# Patient Record
Sex: Female | Born: 1960
Health system: Southern US, Community
[De-identification: ages and names within clinical notes are randomized; demographics above are authoritative.]

## PROBLEM LIST (undated history)

## (undated) DIAGNOSIS — Z21 Asymptomatic human immunodeficiency virus [HIV] infection status: Secondary | ICD-10-CM

## (undated) DIAGNOSIS — B20 Human immunodeficiency virus [HIV] disease: Secondary | ICD-10-CM

## (undated) DIAGNOSIS — K759 Inflammatory liver disease, unspecified: Secondary | ICD-10-CM

## (undated) DIAGNOSIS — F419 Anxiety disorder, unspecified: Secondary | ICD-10-CM

## (undated) DIAGNOSIS — I10 Essential (primary) hypertension: Secondary | ICD-10-CM

## (undated) DIAGNOSIS — F1110 Opioid abuse, uncomplicated: Secondary | ICD-10-CM

## (undated) DIAGNOSIS — F329 Major depressive disorder, single episode, unspecified: Secondary | ICD-10-CM

## (undated) DIAGNOSIS — F32A Depression, unspecified: Secondary | ICD-10-CM

## (undated) HISTORY — DX: Essential (primary) hypertension: I10

## (undated) HISTORY — DX: Asymptomatic human immunodeficiency virus (hiv) infection status: Z21

## (undated) HISTORY — DX: Human immunodeficiency virus (HIV) disease: B20

---

## 1998-04-13 ENCOUNTER — Emergency Department (HOSPITAL_COMMUNITY): Admission: EM | Admit: 1998-04-13 | Discharge: 1998-04-13 | Payer: Self-pay | Admitting: Emergency Medicine

## 2001-06-11 ENCOUNTER — Emergency Department (HOSPITAL_COMMUNITY): Admission: EM | Admit: 2001-06-11 | Discharge: 2001-06-11 | Payer: Self-pay | Admitting: Emergency Medicine

## 2002-01-19 ENCOUNTER — Observation Stay (HOSPITAL_COMMUNITY): Admission: AD | Admit: 2002-01-19 | Discharge: 2002-01-20 | Payer: Self-pay | Admitting: Obstetrics and Gynecology

## 2002-01-19 ENCOUNTER — Encounter: Payer: Self-pay | Admitting: Obstetrics and Gynecology

## 2003-09-23 ENCOUNTER — Emergency Department (HOSPITAL_COMMUNITY): Admission: EM | Admit: 2003-09-23 | Discharge: 2003-09-23 | Payer: Self-pay | Admitting: *Deleted

## 2004-01-26 ENCOUNTER — Emergency Department (HOSPITAL_COMMUNITY): Admission: EM | Admit: 2004-01-26 | Discharge: 2004-01-26 | Payer: Self-pay | Admitting: Emergency Medicine

## 2004-04-23 ENCOUNTER — Emergency Department (HOSPITAL_COMMUNITY): Admission: EM | Admit: 2004-04-23 | Discharge: 2004-04-23 | Payer: Self-pay | Admitting: Emergency Medicine

## 2004-06-03 ENCOUNTER — Emergency Department (HOSPITAL_COMMUNITY): Admission: EM | Admit: 2004-06-03 | Discharge: 2004-06-03 | Payer: Self-pay | Admitting: Family Medicine

## 2005-01-30 ENCOUNTER — Ambulatory Visit: Payer: Self-pay | Admitting: Infectious Diseases

## 2005-01-30 ENCOUNTER — Ambulatory Visit (HOSPITAL_COMMUNITY): Admission: RE | Admit: 2005-01-30 | Discharge: 2005-01-30 | Payer: Self-pay | Admitting: Infectious Diseases

## 2005-05-08 ENCOUNTER — Ambulatory Visit: Payer: Self-pay | Admitting: Infectious Diseases

## 2005-11-26 ENCOUNTER — Emergency Department (HOSPITAL_COMMUNITY): Admission: EM | Admit: 2005-11-26 | Discharge: 2005-11-27 | Payer: Self-pay | Admitting: Emergency Medicine

## 2006-02-11 ENCOUNTER — Emergency Department (HOSPITAL_COMMUNITY): Admission: EM | Admit: 2006-02-11 | Discharge: 2006-02-11 | Payer: Self-pay | Admitting: Family Medicine

## 2006-04-23 ENCOUNTER — Emergency Department (HOSPITAL_COMMUNITY): Admission: EM | Admit: 2006-04-23 | Discharge: 2006-04-23 | Payer: Self-pay | Admitting: Emergency Medicine

## 2006-04-24 ENCOUNTER — Emergency Department (HOSPITAL_COMMUNITY): Admission: EM | Admit: 2006-04-24 | Discharge: 2006-04-24 | Payer: Self-pay | Admitting: Emergency Medicine

## 2006-04-27 ENCOUNTER — Emergency Department (HOSPITAL_COMMUNITY): Admission: EM | Admit: 2006-04-27 | Discharge: 2006-04-27 | Payer: Self-pay | Admitting: Internal Medicine

## 2006-04-30 ENCOUNTER — Ambulatory Visit: Payer: Self-pay | Admitting: Internal Medicine

## 2006-04-30 ENCOUNTER — Encounter: Admission: RE | Admit: 2006-04-30 | Discharge: 2006-04-30 | Payer: Self-pay | Admitting: Internal Medicine

## 2006-04-30 ENCOUNTER — Encounter (INDEPENDENT_AMBULATORY_CARE_PROVIDER_SITE_OTHER): Payer: Self-pay | Admitting: *Deleted

## 2006-04-30 LAB — CONVERTED CEMR LAB
AST: 20 units/L (ref 0–37)
Albumin: 3 g/dL — ABNORMAL LOW (ref 3.5–5.2)
Alkaline Phosphatase: 73 units/L (ref 39–117)
Basophils Relative: 0 % (ref 0–1)
Calcium: 8.6 mg/dL (ref 8.4–10.5)
Chloride: 105 meq/L (ref 96–112)
Cholesterol: 177 mg/dL (ref 0–200)
Creatinine, Ser: 0.75 mg/dL (ref 0.40–1.20)
Glucose, Bld: 106 mg/dL — ABNORMAL HIGH (ref 70–99)
HDL: 39 mg/dL — ABNORMAL LOW (ref >39)
HIV 1 RNA Quant: 511000 copies/mL
HIV 1 RNA Quant: 511000 copies/mL — ABNORMAL HIGH (ref <50)
HIV-1 RNA Quant, Log: 5.71 — ABNORMAL HIGH (ref <1.70)
Hemoglobin, Urine: NEGATIVE
Ketones, ur: NEGATIVE mg/dL
LDL Cholesterol: 106 mg/dL — ABNORMAL HIGH (ref 0–99)
Leukocytes, UA: NEGATIVE
Neutro Abs: 2.2 10*3/uL (ref 1.7–7.7)
Protein, ur: >300 mg/dL — AB
RBC / HPF: NONE SEEN (ref <3)
RDW: 14 % (ref 11.5–14.0)
Sodium: 140 meq/L (ref 135–145)
Total Bilirubin: 0.3 mg/dL (ref 0.3–1.2)
Total CHOL/HDL Ratio: 4.5
Total Protein: 8.1 g/dL (ref 6.0–8.3)
Triglycerides: 161 mg/dL — ABNORMAL HIGH (ref <150)
VLDL: 32 mg/dL (ref 0–40)

## 2006-05-01 ENCOUNTER — Encounter: Admission: RE | Admit: 2006-05-01 | Discharge: 2006-05-01 | Payer: Self-pay | Admitting: Orthopedic Surgery

## 2006-05-15 DIAGNOSIS — B370 Candidal stomatitis: Secondary | ICD-10-CM

## 2006-05-15 DIAGNOSIS — B20 Human immunodeficiency virus [HIV] disease: Secondary | ICD-10-CM | POA: Insufficient documentation

## 2006-06-03 ENCOUNTER — Ambulatory Visit: Payer: Self-pay | Admitting: Internal Medicine

## 2006-06-15 ENCOUNTER — Encounter (INDEPENDENT_AMBULATORY_CARE_PROVIDER_SITE_OTHER): Payer: Self-pay | Admitting: *Deleted

## 2006-06-15 LAB — CONVERTED CEMR LAB

## 2006-06-19 ENCOUNTER — Encounter: Payer: Self-pay | Admitting: Internal Medicine

## 2006-06-26 ENCOUNTER — Emergency Department (HOSPITAL_COMMUNITY): Admission: EM | Admit: 2006-06-26 | Discharge: 2006-06-26 | Payer: Self-pay | Admitting: Family Medicine

## 2006-06-28 ENCOUNTER — Encounter (INDEPENDENT_AMBULATORY_CARE_PROVIDER_SITE_OTHER): Payer: Self-pay | Admitting: *Deleted

## 2006-07-28 ENCOUNTER — Encounter: Admission: RE | Admit: 2006-07-28 | Discharge: 2006-07-28 | Payer: Self-pay | Admitting: Internal Medicine

## 2006-07-28 ENCOUNTER — Ambulatory Visit: Payer: Self-pay | Admitting: Internal Medicine

## 2006-07-28 DIAGNOSIS — F339 Major depressive disorder, recurrent, unspecified: Secondary | ICD-10-CM | POA: Insufficient documentation

## 2006-08-21 ENCOUNTER — Telehealth: Payer: Self-pay | Admitting: Internal Medicine

## 2006-08-21 ENCOUNTER — Ambulatory Visit: Payer: Self-pay | Admitting: Internal Medicine

## 2006-10-05 ENCOUNTER — Encounter: Payer: Self-pay | Admitting: Internal Medicine

## 2006-10-26 ENCOUNTER — Telehealth: Payer: Self-pay | Admitting: Internal Medicine

## 2006-11-17 ENCOUNTER — Ambulatory Visit: Payer: Self-pay | Admitting: Internal Medicine

## 2006-11-17 ENCOUNTER — Encounter: Admission: RE | Admit: 2006-11-17 | Discharge: 2006-11-17 | Payer: Self-pay | Admitting: Internal Medicine

## 2006-11-17 LAB — CONVERTED CEMR LAB
AST: 33 units/L (ref 0–37)
Alkaline Phosphatase: 85 units/L (ref 39–117)
Basophils Relative: 0 % (ref 0–1)
CO2: 27 meq/L (ref 19–32)
Chloride: 102 meq/L (ref 96–112)
Creatinine, Ser: 1.24 mg/dL — ABNORMAL HIGH (ref 0.40–1.20)
Eosinophils Absolute: 0.2 10*3/uL (ref 0.0–0.7)
Eosinophils Relative: 3 % (ref 0–5)
Glucose, Bld: 59 mg/dL — ABNORMAL LOW (ref 70–99)
Lymphocytes Relative: 40 % (ref 12–46)
Lymphs Abs: 2.5 10*3/uL (ref 0.7–3.3)
Monocytes Absolute: 0.6 10*3/uL (ref 0.2–0.7)
Monocytes Relative: 9 % (ref 3–11)
Neutro Abs: 3.1 10*3/uL (ref 1.7–7.7)
Neutrophils Relative %: 48 % (ref 43–77)
Platelets: 298 10*3/uL (ref 150–400)
Potassium: 4.6 meq/L (ref 3.5–5.3)
Sodium: 139 meq/L (ref 135–145)

## 2006-11-23 ENCOUNTER — Telehealth: Payer: Self-pay | Admitting: Internal Medicine

## 2006-11-24 ENCOUNTER — Encounter (INDEPENDENT_AMBULATORY_CARE_PROVIDER_SITE_OTHER): Payer: Self-pay | Admitting: *Deleted

## 2006-11-24 DIAGNOSIS — B182 Chronic viral hepatitis C: Secondary | ICD-10-CM

## 2006-12-10 ENCOUNTER — Encounter (INDEPENDENT_AMBULATORY_CARE_PROVIDER_SITE_OTHER): Payer: Self-pay | Admitting: *Deleted

## 2007-02-02 ENCOUNTER — Ambulatory Visit: Payer: Self-pay | Admitting: Internal Medicine

## 2007-02-02 ENCOUNTER — Encounter: Admission: RE | Admit: 2007-02-02 | Discharge: 2007-02-02 | Payer: Self-pay | Admitting: Internal Medicine

## 2007-02-02 LAB — CONVERTED CEMR LAB
Alkaline Phosphatase: 70 units/L (ref 39–117)
BUN: 14 mg/dL (ref 6–23)
Basophils Absolute: 0 10*3/uL (ref 0.0–0.1)
CO2: 24 meq/L (ref 19–32)
Calcium: 9 mg/dL (ref 8.4–10.5)
Eosinophils Relative: 4 % (ref 0–5)
Lymphocytes Relative: 27 % (ref 12–46)
MCHC: 31.8 g/dL (ref 30.0–36.0)
MCV: 89.8 fL (ref 78.0–100.0)
Monocytes Absolute: 0.9 10*3/uL — ABNORMAL HIGH (ref 0.2–0.7)
Monocytes Relative: 15 % — ABNORMAL HIGH (ref 3–11)
Neutrophils Relative %: 54 % (ref 43–77)
RBC: 4.13 M/uL (ref 3.87–5.11)
RDW: 12.8 % (ref 11.5–14.0)
Sodium: 138 meq/L (ref 135–145)
Total Protein: 8.2 g/dL (ref 6.0–8.3)

## 2007-02-17 ENCOUNTER — Ambulatory Visit: Payer: Self-pay | Admitting: Internal Medicine

## 2007-03-01 ENCOUNTER — Encounter (INDEPENDENT_AMBULATORY_CARE_PROVIDER_SITE_OTHER): Payer: Self-pay | Admitting: *Deleted

## 2007-03-23 ENCOUNTER — Encounter: Admission: RE | Admit: 2007-03-23 | Discharge: 2007-03-23 | Payer: Self-pay | Admitting: Internal Medicine

## 2007-03-23 ENCOUNTER — Ambulatory Visit: Payer: Self-pay | Admitting: Internal Medicine

## 2007-03-23 LAB — CONVERTED CEMR LAB
ALT: 11 units/L (ref 0–35)
Albumin: 3.5 g/dL (ref 3.5–5.2)
Alkaline Phosphatase: 73 units/L (ref 39–117)
Basophils Relative: 0 % (ref 0–1)
Calcium: 9.1 mg/dL (ref 8.4–10.5)
Chloride: 103 meq/L (ref 96–112)
Creatinine, Ser: 0.89 mg/dL (ref 0.40–1.20)
Eosinophils Absolute: 0.2 10*3/uL (ref 0.2–0.7)
Eosinophils Relative: 5 % (ref 0–5)
Glucose, Bld: 91 mg/dL (ref 70–99)
HIV-1 RNA Quant, Log: 5.28 — ABNORMAL HIGH (ref <1.70)
Lymphocytes Relative: 29 % (ref 12–46)
Monocytes Absolute: 0.4 10*3/uL (ref 0.1–1.0)
Monocytes Relative: 9 % (ref 3–12)
Neutro Abs: 2.5 10*3/uL (ref 1.7–7.7)
Platelets: 321 10*3/uL (ref 150–400)
Sodium: 138 meq/L (ref 135–145)
Total Bilirubin: 0.3 mg/dL (ref 0.3–1.2)
Total Protein: 9 g/dL — ABNORMAL HIGH (ref 6.0–8.3)

## 2007-03-31 ENCOUNTER — Encounter: Payer: Self-pay | Admitting: Internal Medicine

## 2007-04-06 ENCOUNTER — Encounter (INDEPENDENT_AMBULATORY_CARE_PROVIDER_SITE_OTHER): Payer: Self-pay | Admitting: *Deleted

## 2007-05-28 ENCOUNTER — Telehealth: Payer: Self-pay

## 2007-07-13 ENCOUNTER — Encounter: Payer: Self-pay | Admitting: Internal Medicine

## 2007-07-19 ENCOUNTER — Encounter: Payer: Self-pay | Admitting: Internal Medicine

## 2007-07-19 ENCOUNTER — Telehealth (INDEPENDENT_AMBULATORY_CARE_PROVIDER_SITE_OTHER): Payer: Self-pay | Admitting: *Deleted

## 2007-07-19 ENCOUNTER — Ambulatory Visit: Payer: Self-pay | Admitting: Infectious Disease

## 2007-07-19 ENCOUNTER — Encounter: Admission: RE | Admit: 2007-07-19 | Discharge: 2007-07-19 | Payer: Self-pay | Admitting: Infectious Disease

## 2007-07-26 ENCOUNTER — Encounter: Payer: Self-pay | Admitting: Internal Medicine

## 2007-08-03 ENCOUNTER — Ambulatory Visit: Payer: Self-pay | Admitting: Internal Medicine

## 2007-08-03 DIAGNOSIS — I10 Essential (primary) hypertension: Secondary | ICD-10-CM | POA: Insufficient documentation

## 2007-11-01 ENCOUNTER — Encounter: Admission: RE | Admit: 2007-11-01 | Discharge: 2007-11-01 | Payer: Self-pay | Admitting: Internal Medicine

## 2007-11-01 ENCOUNTER — Ambulatory Visit: Payer: Self-pay | Admitting: Internal Medicine

## 2007-11-01 LAB — CONVERTED CEMR LAB: HIV 1 RNA Quant: 122 copies/mL — ABNORMAL HIGH (ref <50)

## 2007-11-02 ENCOUNTER — Telehealth (INDEPENDENT_AMBULATORY_CARE_PROVIDER_SITE_OTHER): Payer: Self-pay | Admitting: *Deleted

## 2007-11-02 DIAGNOSIS — D649 Anemia, unspecified: Secondary | ICD-10-CM | POA: Insufficient documentation

## 2007-11-02 LAB — CONVERTED CEMR LAB
ALT: 29 units/L (ref 0–35)
Alkaline Phosphatase: 105 units/L (ref 39–117)
BUN: 18 mg/dL (ref 6–23)
Basophils Absolute: 0 10*3/uL (ref 0.0–0.1)
Basophils Relative: 0 % (ref 0–1)
Calcium: 8.7 mg/dL (ref 8.4–10.5)
Creatinine, Ser: 0.96 mg/dL (ref 0.40–1.20)
Eosinophils Relative: 2 % (ref 0–5)
HCT: 29.7 % — ABNORMAL LOW (ref 36.0–46.0)
Lymphocytes Relative: 23 % (ref 12–46)
Lymphs Abs: 1.7 10*3/uL (ref 0.7–4.0)
MCHC: 31 g/dL (ref 30.0–36.0)
Neutro Abs: 5 10*3/uL (ref 1.7–7.7)
Potassium: 4.4 meq/L (ref 3.5–5.3)
RBC: 3.6 M/uL — ABNORMAL LOW (ref 3.87–5.11)

## 2007-11-16 ENCOUNTER — Telehealth (INDEPENDENT_AMBULATORY_CARE_PROVIDER_SITE_OTHER): Payer: Self-pay | Admitting: *Deleted

## 2007-11-16 ENCOUNTER — Ambulatory Visit: Payer: Self-pay | Admitting: Internal Medicine

## 2007-11-16 LAB — CONVERTED CEMR LAB
Saturation Ratios: 5 % — ABNORMAL LOW (ref 20–55)
TIBC: 461 ug/dL (ref 250–470)
UIBC: 438 ug/dL

## 2008-04-19 ENCOUNTER — Ambulatory Visit: Payer: Self-pay | Admitting: Internal Medicine

## 2008-04-19 LAB — CONVERTED CEMR LAB
ALT: 22 units/L (ref 0–35)
AST: 27 units/L (ref 0–37)
Albumin: 3.7 g/dL (ref 3.5–5.2)
BUN: 14 mg/dL (ref 6–23)
Basophils Relative: 1 % (ref 0–1)
Chloride: 101 meq/L (ref 96–112)
Creatinine, Ser: 0.78 mg/dL (ref 0.40–1.20)
Eosinophils Relative: 2 % (ref 0–5)
HCT: 36.6 % (ref 36.0–46.0)
Hemoglobin: 10.6 g/dL — ABNORMAL LOW (ref 12.0–15.0)
Lymphocytes Relative: 30 % (ref 12–46)
Lymphs Abs: 2.7 10*3/uL (ref 0.7–4.0)
MCV: 77.5 fL — ABNORMAL LOW (ref 78.0–100.0)
Neutrophils Relative %: 55 % (ref 43–77)
Platelets: 416 10*3/uL — ABNORMAL HIGH (ref 150–400)
Potassium: 4.6 meq/L (ref 3.5–5.3)
RBC: 4.72 M/uL (ref 3.87–5.11)
RDW: 16.6 % — ABNORMAL HIGH (ref 11.5–15.5)
Total Bilirubin: 0.5 mg/dL (ref 0.3–1.2)
WBC: 8.8 10*3/uL (ref 4.0–10.5)

## 2008-05-09 ENCOUNTER — Encounter: Payer: Self-pay | Admitting: Internal Medicine

## 2008-05-23 ENCOUNTER — Ambulatory Visit (HOSPITAL_COMMUNITY): Admission: RE | Admit: 2008-05-23 | Discharge: 2008-05-23 | Payer: Self-pay | Admitting: Internal Medicine

## 2008-05-23 ENCOUNTER — Ambulatory Visit: Payer: Self-pay | Admitting: Internal Medicine

## 2008-05-23 DIAGNOSIS — R05 Cough: Secondary | ICD-10-CM

## 2008-05-23 DIAGNOSIS — R059 Cough, unspecified: Secondary | ICD-10-CM | POA: Insufficient documentation

## 2008-05-24 ENCOUNTER — Encounter: Payer: Self-pay | Admitting: Licensed Clinical Social Worker

## 2008-08-18 ENCOUNTER — Emergency Department (HOSPITAL_COMMUNITY): Admission: EM | Admit: 2008-08-18 | Discharge: 2008-08-18 | Payer: Self-pay | Admitting: Emergency Medicine

## 2008-12-05 ENCOUNTER — Emergency Department (HOSPITAL_COMMUNITY): Admission: EM | Admit: 2008-12-05 | Discharge: 2008-12-05 | Payer: Self-pay | Admitting: Family Medicine

## 2008-12-08 ENCOUNTER — Ambulatory Visit: Payer: Self-pay | Admitting: Internal Medicine

## 2009-03-01 ENCOUNTER — Ambulatory Visit: Payer: Self-pay | Admitting: Internal Medicine

## 2009-03-01 LAB — CONVERTED CEMR LAB
ALT: 16 units/L (ref 0–35)
AST: 27 units/L (ref 0–37)
Albumin: 3.9 g/dL (ref 3.5–5.2)
BUN: 11 mg/dL (ref 6–23)
CO2: 20 meq/L (ref 19–32)
Calcium: 9.2 mg/dL (ref 8.4–10.5)
Creatinine, Ser: 0.8 mg/dL (ref 0.40–1.20)
Eosinophils Absolute: 0.2 10*3/uL (ref 0.0–0.7)
Eosinophils Relative: 4 % (ref 0–5)
Glucose, Bld: 90 mg/dL (ref 70–99)
HIV-1 RNA Quant, Log: 3.05 — ABNORMAL HIGH (ref <1.68)
LDL Cholesterol: 126 mg/dL — ABNORMAL HIGH (ref 0–99)
Lymphocytes Relative: 34 % (ref 12–46)
MCV: 85.7 fL (ref >=78.0)
Neutrophils Relative %: 55 % (ref 43–77)
Potassium: 4.7 meq/L (ref 3.5–5.3)
RBC: 4.67 M/uL (ref 3.87–5.11)
Total Bilirubin: 0.5 mg/dL (ref 0.3–1.2)
Total Protein: 7.6 g/dL (ref 6.0–8.3)
Triglycerides: 116 mg/dL (ref <150)
VLDL: 23 mg/dL (ref 0–40)

## 2009-04-04 ENCOUNTER — Ambulatory Visit: Payer: Self-pay | Admitting: Internal Medicine

## 2009-04-11 ENCOUNTER — Telehealth (INDEPENDENT_AMBULATORY_CARE_PROVIDER_SITE_OTHER): Payer: Self-pay | Admitting: *Deleted

## 2009-06-01 ENCOUNTER — Telehealth (INDEPENDENT_AMBULATORY_CARE_PROVIDER_SITE_OTHER): Payer: Self-pay | Admitting: *Deleted

## 2009-06-25 ENCOUNTER — Telehealth (INDEPENDENT_AMBULATORY_CARE_PROVIDER_SITE_OTHER): Payer: Self-pay | Admitting: *Deleted

## 2009-07-11 ENCOUNTER — Ambulatory Visit: Payer: Self-pay | Admitting: Internal Medicine

## 2009-07-11 LAB — CONVERTED CEMR LAB
AST: 29 units/L (ref 0–37)
Albumin: 3.8 g/dL (ref 3.5–5.2)
Basophils Relative: 0 % (ref 0–1)
Calcium: 9.4 mg/dL (ref 8.4–10.5)
Chloride: 102 meq/L (ref 96–112)
Eosinophils Absolute: 0.2 10*3/uL (ref 0.0–0.7)
Eosinophils Relative: 3 % (ref 0–5)
Glucose, Bld: 126 mg/dL — ABNORMAL HIGH (ref 70–99)
HIV-1 RNA Quant, Log: <1.68 (ref <1.68)
Lymphs Abs: 1.7 10*3/uL (ref 0.7–4.0)
Neutro Abs: 2.9 10*3/uL (ref 1.7–7.7)
Neutrophils Relative %: 57 % (ref 43–77)
Platelets: 320 10*3/uL (ref 150–400)
Potassium: 4.5 meq/L (ref 3.5–5.3)
RBC: 4.22 M/uL (ref 3.87–5.11)
Total Bilirubin: 0.5 mg/dL (ref 0.3–1.2)

## 2009-07-12 ENCOUNTER — Encounter: Payer: Self-pay | Admitting: Internal Medicine

## 2009-08-09 ENCOUNTER — Ambulatory Visit: Payer: Self-pay | Admitting: Internal Medicine

## 2009-10-25 ENCOUNTER — Ambulatory Visit: Payer: Self-pay | Admitting: Internal Medicine

## 2009-10-25 LAB — CONVERTED CEMR LAB
ALT: 24 U/L
AST: 33 U/L
Albumin: 3.6 g/dL
Alkaline Phosphatase: 68 U/L
BUN: 9 mg/dL
Basophils Absolute: 0 K/uL
Basophils Relative: 0 %
CO2: 23 meq/L
Calcium: 8.9 mg/dL
Chloride: 101 meq/L
Creatinine, Ser: 0.98 mg/dL
Eosinophils Absolute: 0.1 K/uL
Eosinophils Relative: 2 %
Glucose, Bld: 88 mg/dL
HCT: 32.5 % — ABNORMAL LOW
HIV 1 RNA Quant: 50 {copies}/mL — ABNORMAL HIGH
HIV-1 RNA Quant, Log: 1.7 — ABNORMAL HIGH
Hemoglobin: 10.1 g/dL — ABNORMAL LOW
Hep A Total Ab: NEGATIVE
Lymphocytes Relative: 18 %
Lymphs Abs: 1 K/uL
MCHC: 31.1 g/dL
MCV: 86.7 fL
Monocytes Absolute: 0.4 K/uL
Monocytes Relative: 6 %
Neutro Abs: 4.3 K/uL
Neutrophils Relative %: 74 %
Platelets: 290 K/uL
Potassium: 4.6 meq/L
RBC: 3.75 M/uL — ABNORMAL LOW
RDW: 14.8 %
Sodium: 136 meq/L
Total Bilirubin: 0.4 mg/dL
Total Protein: 7.1 g/dL
WBC: 5.8 10*3/microliter

## 2009-11-07 ENCOUNTER — Ambulatory Visit: Payer: Self-pay | Admitting: Internal Medicine

## 2010-01-07 ENCOUNTER — Telehealth: Payer: Self-pay | Admitting: Internal Medicine

## 2010-01-17 ENCOUNTER — Encounter (INDEPENDENT_AMBULATORY_CARE_PROVIDER_SITE_OTHER): Payer: Self-pay | Admitting: *Deleted

## 2010-01-18 ENCOUNTER — Encounter (INDEPENDENT_AMBULATORY_CARE_PROVIDER_SITE_OTHER): Payer: Self-pay | Admitting: *Deleted

## 2010-02-08 ENCOUNTER — Encounter (INDEPENDENT_AMBULATORY_CARE_PROVIDER_SITE_OTHER): Payer: Self-pay | Admitting: *Deleted

## 2010-03-12 ENCOUNTER — Encounter (INDEPENDENT_AMBULATORY_CARE_PROVIDER_SITE_OTHER): Payer: Self-pay | Admitting: *Deleted

## 2010-04-26 ENCOUNTER — Ambulatory Visit: Admit: 2010-04-26 | Payer: Self-pay | Admitting: Internal Medicine

## 2010-05-03 ENCOUNTER — Encounter: Payer: Self-pay | Admitting: Internal Medicine

## 2010-05-03 ENCOUNTER — Ambulatory Visit
Admission: RE | Admit: 2010-05-03 | Discharge: 2010-05-03 | Payer: Self-pay | Source: Home / Self Care | Attending: Internal Medicine | Admitting: Internal Medicine

## 2010-05-03 ENCOUNTER — Other Ambulatory Visit: Payer: Self-pay | Admitting: Internal Medicine

## 2010-05-03 LAB — CONVERTED CEMR LAB
Albumin: 4.4 g/dL (ref 3.5–5.2)
BUN: 13 mg/dL (ref 6–23)
Chloride: 106 meq/L (ref 96–112)
Creatinine, Ser: 0.83 mg/dL (ref 0.40–1.20)
Eosinophils Absolute: 0.1 10*3/uL (ref 0.0–0.7)
Eosinophils Relative: 1 % (ref 0–5)
Glucose, Bld: 108 mg/dL — ABNORMAL HIGH (ref 70–99)
HCT: 37.9 % (ref 36.0–46.0)
Lymphocytes Relative: 32 % (ref 12–46)
Neutrophils Relative %: 59 % (ref 43–77)
Platelets: 336 10*3/uL (ref 150–400)
Potassium: 4.3 meq/L (ref 3.5–5.3)
RBC: 4.76 M/uL (ref 3.87–5.11)
Total Protein: 8.2 g/dL (ref 6.0–8.3)

## 2010-05-10 ENCOUNTER — Encounter (INDEPENDENT_AMBULATORY_CARE_PROVIDER_SITE_OTHER): Payer: Self-pay | Admitting: *Deleted

## 2010-05-17 ENCOUNTER — Ambulatory Visit: Admit: 2010-05-17 | Payer: Self-pay | Attending: Internal Medicine | Admitting: Internal Medicine

## 2010-05-19 LAB — CONVERTED CEMR LAB
ALT: 23 U/L
AST: 32 U/L
Albumin: 3.4 g/dL — ABNORMAL LOW
Alkaline Phosphatase: 85 units/L (ref 39–117)
Alkaline Phosphatase: 87 U/L
BUN: 15 mg/dL
BUN: 20 mg/dL (ref 6–23)
Basophils Absolute: 0 K/uL
Basophils Relative: 1 %
CD4 Count: 110 uL
CO2: 24 meq/L
Calcium: 9 mg/dL (ref 8.4–10.5)
Calcium: 9.1 mg/dL
Chloride: 101 meq/L
Cholesterol: 223 mg/dL — ABNORMAL HIGH (ref 0–200)
Creatinine, Ser: 0.79 mg/dL
Eosinophils Absolute: 0.2 10*3/uL (ref 0.0–0.7)
Eosinophils Absolute: 0.2 K/uL
Eosinophils Relative: 4 %
Eosinophils Relative: 4 % (ref 0–5)
Glucose, Bld: 78 mg/dL
HCT: 35 % — ABNORMAL LOW
HCT: 35.3 % — ABNORMAL LOW (ref 36.0–46.0)
HIV 1 RNA Quant: 111000 {copies}/mL — ABNORMAL HIGH
HIV 1 RNA Quant: 351 {copies}/mL — ABNORMAL HIGH
HIV-1 RNA Quant, Log: 2.55 — ABNORMAL HIGH
HIV-1 RNA Quant, Log: 5.05 — ABNORMAL HIGH
Hemoglobin: 11.1 g/dL — ABNORMAL LOW
Hemoglobin: 11.1 g/dL — ABNORMAL LOW (ref 12.0–15.0)
Lymphocytes Relative: 35 %
Lymphs Abs: 1.7 K/uL
Lymphs Abs: 1.9 10*3/uL (ref 0.7–4.0)
MCHC: 31.4 g/dL (ref 30.0–36.0)
MCHC: 31.7 g/dL
MCV: 81.3 fL (ref 78.0–100.0)
MCV: 83.7 fL
Monocytes Absolute: 0.6 K/uL
Monocytes Relative: 13 % — ABNORMAL HIGH
Neutro Abs: 2.2 K/uL
Neutrophils Relative %: 48 %
Platelets: 284 K/uL
Platelets: 382 10*3/uL (ref 150–400)
Potassium: 3.9 meq/L (ref 3.5–5.3)
Potassium: 4.6 meq/L
RBC: 4.18 M/uL
RDW: 16.1 % — ABNORMAL HIGH
Sodium: 136 meq/L
Sodium: 137 meq/L (ref 135–145)
Total Bilirubin: 0.3 mg/dL
Total Bilirubin: 0.3 mg/dL (ref 0.3–1.2)
Total CHOL/HDL Ratio: 5.4
Total Protein: 8.5 g/dL — ABNORMAL HIGH
Total Protein: 8.9 g/dL — ABNORMAL HIGH (ref 6.0–8.3)
Triglycerides: 144 mg/dL (ref <150)
WBC: 4.7 10*3/microliter
WBC: 5.2 10*3/uL (ref 4.0–10.5)

## 2010-05-21 ENCOUNTER — Ambulatory Visit: Admit: 2010-05-21 | Payer: Self-pay | Admitting: Internal Medicine

## 2010-05-21 NOTE — Progress Notes (Signed)
Summary: refill request/ specifally Ambien  Phone Note Refill Request Message from:  Fax from Pharmacy  Refills Requested: Medication #1:  KALETRA 200-50 MG TABS Take 2 tablets by mouth two times a day  Medication #2:  BACTRIM DS 800-160 MG TABS Take 1 tablet by mouth once a day  Medication #3:  TRUVADA 200-300 MG TABS Take 1 tablet by mouth once a day  Medication #4:  AMBIEN 10 MG TABS Take 1 tablet by mouth at bedtime and Zoloft 50mg    Method Requested: Telephone to Pharmacy Next Appointment Scheduled: July 27, 2009 Initial call taken by: Paulo Fruit  BS,CPht II,MPH,  June 25, 2009 4:52 PM  Follow-up for Phone Call        ok x 3 Follow-up by: Yisroel Ramming MD,  June 26, 2009 2:36 PM    Prescriptions: AMBIEN 10 MG TABS (ZOLPIDEM TARTRATE) Take 1 tablet by mouth at bedtime  #30 x 3   Entered by:   Paulo Fruit  BS,CPht II,MPH   Authorized by:   Yisroel Ramming MD   Signed by:   Paulo Fruit  BS,CPht II,MPH on 06/28/2009   Method used:   Telephoned to ...       [facility] (mail-order)       [address]       [place], [place]  [address]       Ph: [phone]       Fax: [phone]   RxID:   [id number] ZOLOFT 50 MG TABS (SERTRALINE HCL) Take 1 tablet by mouth once a day  #30 x 3   Entered by:   Paulo Fruit  BS,CPht II,MPH   Authorized by:   Yisroel Ramming MD   Signed by:   Paulo Fruit  BS,CPht II,MPH on 06/28/2009   Method used:   Telephoned to ...       [facility] (mail-order)       [address]       [place], [place]  [address]       Ph: [phone]       Fax: [phone]   RxID:   [id number] TRUVADA 200-300 MG TABS (EMTRICITABINE-TENOFOVIR) Take 1 tablet by mouth once a day  #30 x 3   Entered by:   Paulo Fruit  BS,CPht II,MPH   Authorized by:   Yisroel Ramming MD   Signed by:   Paulo Fruit  BS,CPht II,MPH on 06/28/2009   Method used:   Telephoned to ...       [facility] (mail-order)       [address]       [place], [place]  [address]       Ph: [phone]       Fax: [phone]   RxID:   [id number] BACTRIM DS 800-160 MG TABS (SULFAMETHOXAZOLE-TRIMETHOPRIM) Take 1 tablet by mouth once a day  #30 x 3   Entered by:   Paulo Fruit  BS,CPht II,MPH   Authorized by:   Yisroel Ramming MD   Signed by:   Paulo Fruit  BS,CPht II,MPH on 06/28/2009   Method used:   Telephoned to ...       [facility] (mail-order)       [address]       [place], [place]  [address]       Ph: [phone]       Fax: [phone]   RxID:   [id number] KALETRA 200-50 MG  TABS (LOPINAVIR-RITONAVIR) Take 2 tablets by mouth two times a day  #120 x 3   Entered by:   Paulo Fruit  BS,CPht II,MPH   Authorized by:   Yisroel Ramming MD   Signed by:   Paulo Fruit  BS,CPht II,MPH on 06/28/2009   Method used:   Telephoned to ...       [facility] (mail-order)       [address]       [place], [place]  [address]       Ph: [phone]       Fax: [phone]   RxID:   [id number]  Paulo Fruit  BS,CPht II,MPH  June 28, 2009 11:51 AM

## 2010-05-21 NOTE — Progress Notes (Signed)
Summary: refill request for Ambien per [facility]  Phone Note From Pharmacy   Caller: [facility] Reason for Call: Needs renewal Summary of Call: Received refill request for Kaletra, Zoloft, Bactrim DS, Truvada, and Ambien.  Follow-up for Phone Call        How many refills do you want to authorize on her Ambien? Follow-up by: Paulo Fruit  BS,CPht II,MPH,  January 07, 2010 9:33 AM  Additional Follow-up for Phone Call Additional follow up Details #1::        3 Additional Follow-up by: Yisroel Ramming MD,  January 07, 2010 11:25 AM    Prescriptions: AMBIEN 10 MG TABS (ZOLPIDEM TARTRATE) Take 1 tablet by mouth at bedtime  #30 x 3   Entered by:   Paulo Fruit  BS,CPht II,MPH   Authorized by:   Yisroel Ramming MD   Signed by:   Paulo Fruit  BS,CPht II,MPH on 01/09/2010   Method used:   Telephoned to ...       [facility] (retail)       [address]       [place], [place]  [address]       Ph: [phone]       Fax: [phone]   RxID:   [id number] ZOLOFT 50 MG TABS (SERTRALINE HCL) Take 1 tablet by mouth once a day  #30 x 6   Entered by:   Paulo Fruit  BS,CPht II,MPH   Authorized by:   Yisroel Ramming MD   Signed by:   Paulo Fruit  BS,CPht II,MPH on 01/09/2010   Method used:   Telephoned to ...       [facility] (retail)       [address]       [place], [place]  [address]       Ph: [phone]       Fax: [phone]   RxID:   [id number] TRUVADA 200-300 MG TABS (EMTRICITABINE-TENOFOVIR) Take 1 tablet by mouth once a day  #30 x 6   Entered by:   Paulo Fruit  BS,CPht II,MPH   Authorized by:   Yisroel Ramming MD   Signed by:   Paulo Fruit  BS,CPht II,MPH on 01/09/2010   Method used:   Telephoned to ...       [facility] (retail)       [address]       [place], [place]  [address]       Ph: [phone]       Fax: [phone]   RxID:   [id number] BACTRIM DS 800-160 MG TABS  (SULFAMETHOXAZOLE-TRIMETHOPRIM) Take 1 tablet by mouth once a day  #30 x 6   Entered by:   Paulo Fruit  BS,CPht II,MPH   Authorized by:   Yisroel Ramming MD   Signed by:   Paulo Fruit  BS,CPht II,MPH on 01/09/2010   Method used:   Telephoned to ...       [facility] (retail)       [address]       [place], [place]  [address]       Ph: [phone]       Fax: [phone]   RxID:   [id number] KALETRA 200-50 MG TABS (LOPINAVIR-RITONAVIR) Take 2 tablets by mouth two times a day  #120 x 6   Entered by:   Paulo Fruit  BS,CPht II,MPH   Authorized by:   Tresa Endo  Lauralee Waters MD   Signed by:   Paulo Fruit  BS,CPht II,MPH on 01/09/2010   Method used:   Telephoned to ...       [facility] (retail)       [address]       [place], [place]  [address]       Ph: [phone]       Fax: [phone]   RxID:   [id number]  Paulo Fruit  BS,CPht II,MPH  January 09, 2010 3:51 PM

## 2010-05-21 NOTE — Assessment & Plan Note (Signed)
Summary: 2month f/u [mkj]   CC:  follow-up visit, lab results, c/o wheezing since stopping smoking, and trouble sleeping at times.  History of Present Illness: Pt stopped smoking yesterday and has a slight non-productive cough and some wheezing.  No fever. She also started seeing a therapist for depression and to help her deal with the stress in her life. She has had periods where she can't sleep because of all of the stress she is under.  Preventive Screening-Counseling & Management  Alcohol-Tobacco     Alcohol drinks/day: 0     Smoking Status: quit < 6 months     Packs/Day: 6 cigs     Year Quit: 10/2009  Caffeine-Diet-Exercise     Caffeine use/day: tea occasional     Does Patient Exercise: yes     Type of exercise: walking  Safety-Violence-Falls     Seat Belt Use: yes      Sexual History:  n/a.     Updated Prior Medication List: KALETRA 200-50 MG TABS (LOPINAVIR-RITONAVIR) Take 2 tablets by mouth two times a day BACTRIM DS 800-160 MG TABS (SULFAMETHOXAZOLE-TRIMETHOPRIM) Take 1 tablet by mouth once a day TRUVADA 200-300 MG TABS (EMTRICITABINE-TENOFOVIR) Take 1 tablet by mouth once a day ZOLOFT 50 MG TABS (SERTRALINE HCL) Take 1 tablet by mouth once a day AMBIEN 10 MG TABS (ZOLPIDEM TARTRATE) Take 1 tablet by mouth at bedtime ENSURE  LIQD (NUTRITIONAL SUPPLEMENTS) one can two times a day  Current Allergies (reviewed today): No known allergies  Past History:  Past Medical History: Last updated: 05/15/2006 HIV disease Thrush  Review of Systems       The patient complains of dyspnea on exertion.  The patient denies anorexia, fever, weight loss, prolonged cough, and hemoptysis.    Vital Signs:  Patient profile:   50 year old female Height:      63 inches (160.02 cm) Weight:      135.4 pounds (61.55 kg) BMI:     24.07 Temp:     98.4 degrees F (36.89 degrees C) oral Pulse rate:   68 / minute BP sitting:   144 / 82  (right arm)  Vitals Entered By: Wendall Mola CMA Duncan Dull) (November 07, 2009 3:20 PM) CC: follow-up visit, lab results, c/o wheezing since stopping smoking, trouble sleeping at times Is Patient Diabetic? No Pain Assessment Patient in pain? no      Nutritional Status BMI of 19 -24 = normal Nutritional Status Detail appetite "normal"  Does patient need assistance? Functional Status Self care Ambulation Normal Comments pt. missed two doses of meds since last visit   Physical Exam  General:  alert, well-developed, well-nourished, and well-hydrated.   Head:  normocephalic and atraumatic.   Mouth:  pharynx pink and moist.   Lungs:  normal breath sounds, no crackles, and no wheezes.   Heart:  normal rate and regular rhythm.      Impression & Recommendations:  Problem # 1:  HIV DISEASE (ICD-042) Pt.s most recent CD4ct was 110 and VL 50 .  Pt instructed to continue the current antiretroviral regimen.  Pt encouraged to take medication regularly and not miss doses.  Pt will f/u in 3 months for repeat blood work and will see me 2 weeks later.  Diagnostics Reviewed:  HIV: CDC-defined AIDS (04/04/2009)   CD4: 110 (10/26/2009)   WBC: 5.8 (10/25/2009)   Hgb: 10.1 (10/25/2009)   HCT: 32.5 (10/25/2009)   Platelets: 290 (10/25/2009) HIV genotype: * (03/01/2009)   HIV-1 RNA: 50 (  10/25/2009)   HBSAg: NO (06/15/2006)  Problem # 2:  DEPRESSIVE DISORDER, MAJOR RCR, UNSPECIFIED (ICD-296.30) seeing a therapist  Problem # 3:  COUGH (ICD-786.2) lungs sound clear may be secondary to smoking  Other Orders: Est. Patient Level III ([address]) Future Orders: T-CD4SP ([facility]) (CD4SP) ... 02/05/2010 T-HIV Viral Load [id number]) ... 02/05/2010 T-Comprehensive Metabolic Panel [id number]) ... 02/05/2010 T-CBC w/Diff ([id number]) ... 02/05/2010  Patient Instructions: 1)  Please schedule a follow-up appointment in 3 months, 2 weeks after labs. 2)  Schedule for PAP

## 2010-05-21 NOTE — Progress Notes (Signed)
  Phone Note Call from Patient   Summary of Call: connected to [facility] 03/28/09.

## 2010-05-21 NOTE — Assessment & Plan Note (Signed)
Summary: FIND OUT LAB VALUES /DDE   CC:  follow-up visit and lab results.  History of Present Illness: Pt here for lab results.  Whe was unable to get them at last visit due to the computers being down.  Preventive Screening-Counseling & Management  Alcohol-Tobacco     Alcohol drinks/day: 0     Smoking Status: current     Packs/Day: 6 cigs  Caffeine-Diet-Exercise     Caffeine use/day: 0     Does Patient Exercise: yes     Type of exercise: walking  Safety-Violence-Falls     Seat Belt Use: yes      Sexual History:  n/a.    Comments: pt declined condoms   Updated Prior Medication List: KALETRA 200-50 MG TABS (LOPINAVIR-RITONAVIR) Take 2 tablets by mouth two times a day BACTRIM DS 800-160 MG TABS (SULFAMETHOXAZOLE-TRIMETHOPRIM) Take 1 tablet by mouth once a day TRUVADA 200-300 MG TABS (EMTRICITABINE-TENOFOVIR) Take 1 tablet by mouth once a day ZOLOFT 50 MG TABS (SERTRALINE HCL) Take 1 tablet by mouth once a day AMBIEN 10 MG TABS (ZOLPIDEM TARTRATE) Take 1 tablet by mouth at bedtime ENSURE  LIQD (NUTRITIONAL SUPPLEMENTS) one can two times a day  Current Allergies (reviewed today): No known allergies  Past History:  Past Medical History: Last updated: 05/15/2006 HIV disease Thrush  Social History: Sexual History:  n/a  Review of Systems  The patient denies anorexia, fever, and weight loss.    Vital Signs:  Patient profile:   50 year old female Height:      63 inches (160.02 cm) Weight:      137.12 pounds (62.33 kg) BMI:     24.38 Temp:     98.4 degrees F (36.89 degrees C) oral Pulse rate:   69 / minute BP sitting:   130 / 84  (right arm)  Vitals Entered By: Wendall Mola CMA Duncan Dull) (August 09, 2009 10:16 AM) CC: follow-up visit, lab results Is Patient Diabetic? No Pain Assessment Patient in pain? no      Nutritional Status BMI of 19 -24 = normal Nutritional Status Detail appetite "good"  Have you ever been in a relationship where you felt  threatened, hurt or afraid?No   Does patient need assistance? Functional Status Self care Ambulation Normal Comments only one missed dose of meds since last visit per patient   Physical Exam  General:  alert, well-developed, well-nourished, and well-hydrated.   Head:  normocephalic and atraumatic.   Mouth:  pharynx pink and moist.   Lungs:  normal breath sounds.      Impression & Recommendations:  Problem # 1:  HIV DISEASE (ICD-042) Pt.s most recent CD4ct was 110 and VL <48 .  Pt instructed to continue the current antiretroviral regimen.  Pt encouraged to take medication regularly and not miss doses.  Pt will f/u in 3 months for repeat blood work and will see me 2 weeks later. Needs to schedule a PAP.  Diagnostics Reviewed:  HIV: CDC-defined AIDS (04/04/2009)   CD4: 110 (07/12/2009)   WBC: 5.1 (07/11/2009)   Hgb: 11.2 (07/11/2009)   HCT: 36.6 (07/11/2009)   Platelets: 320 (07/11/2009) HIV genotype: * (03/01/2009)   HIV-1 RNA: <48 copies/mL (07/11/2009)   HBSAg: NO (06/15/2006)  Other Orders: Est. Patient Level III ([address]) Future Orders: T-CD4SP ([facility]) (CD4SP) ... 11/07/2009 T-HIV Viral Load [id number]) ... 11/07/2009 T-Comprehensive Metabolic Panel [id number]) ... 11/07/2009 T-CBC w/Diff ([id number]) ... 11/07/2009 T-Hepatitis A Antibody ([id number]) ... 11/07/2009  Patient Instructions: 1)  Please schedule  a follow-up appointment in 3 months, 2 weeks after labs. 2)  Schedule in PAP clinic

## 2010-05-21 NOTE — Miscellaneous (Signed)
Summary: [facility]  Clinical Lists Changes  0/18/11-client phone BC after finding BC's number on a letter [facility] mailed.  Client interested in [facility] services again. Client stated that she has missed several appts due to not being able to connect with medicaid transport due to the phone lines being constantly busy.  BC scheduled client's intake/assessment for 10/21 @ 11:00.

## 2010-05-21 NOTE — Miscellaneous (Signed)
  Clinical Lists Changes 

## 2010-05-21 NOTE — Progress Notes (Signed)
  Phone Note Call from Patient   Caller: Patient Summary of Call: 03/29/09-connected with [employer]. Home visit 04/19/09-[facility] followed up with client about medical appt Ferd Glassing  June 01, 2009 2:26 PM

## 2010-05-21 NOTE — Letter (Signed)
Summary: MEDICAL CASE MANAGEMENT REFERRAL   MEDICAL CASE MANAGEMENT REFERRAL   Imported By: Margie Billet 07/17/2009 10:40:56  _____________________________________________________________________  External Attachment:    Type:   Image     Comment:   External Document

## 2010-05-21 NOTE — Miscellaneous (Signed)
Summary: [facility]  Clinical Lists Changes BC received referral on client 01/18/10.  BC phone, phone number listed disconnected.  [facility] mailed client a letter.

## 2010-05-21 NOTE — Miscellaneous (Signed)
Summary: [facility]  Clinical Lists Changes  C informed 02/05/10 that client is connected with [facility] and her case manager is Forensic scientist.  BC will not reenroll client under the [facility] program due to client being connected with [facility].

## 2010-05-21 NOTE — Miscellaneous (Signed)
Summary: Orders Update - [facility] Referral  Clinical Lists Changes  Orders: Added new Referral order of Misc. Referral (Misc. Ref) - Signed 

## 2010-05-23 NOTE — Assessment & Plan Note (Signed)
Summary: PAP smear   Vitals Entered By: Jennet Maduro RN (May 03, 2010 11:20 AM) CC: PAP smear visit.  Pt. given condoms.  Pt. given educational materials re:  HIV and women, diet, exercise, PAP smears, BSE, mammagrams, nutrition and self-esteem.   Evaluation and Follow-Up  Prevention For Positives: 05/03/2010   Safe sex practices discussed with patient. Condoms offered. Prior Medications: KALETRA 200-50 MG TABS (LOPINAVIR-RITONAVIR) Take 2 tablets by mouth two times a day BACTRIM DS 800-160 MG TABS (SULFAMETHOXAZOLE-TRIMETHOPRIM) Take 1 tablet by mouth once a day TRUVADA 200-300 MG TABS (EMTRICITABINE-TENOFOVIR) Take 1 tablet by mouth once a day ZOLOFT 50 MG TABS (SERTRALINE HCL) Take 1 tablet by mouth once a day AMBIEN 10 MG TABS (ZOLPIDEM TARTRATE) Take 1 tablet by mouth at bedtime ENSURE  LIQD (NUTRITIONAL SUPPLEMENTS) one can two times a day Current Allergies: No known allergies  Orders Added: 1)  Est. Patient Level I [[address]] 2)  T-PAP [facility]) [[address]]             Prevention For Positives: 05/03/2010   Safe sex practices discussed with patient. Condoms offered.

## 2010-05-23 NOTE — Letter (Signed)
Summary: Results Follow-up Letter  [facility]  [address]   [place], [place] [address]   Phone: [phone]  Fax: [phone]          May 13, 2010  [address] [place], [place]  [address]  Dear Ms. Vandehei,   The following are the results of your recent test(s):  Test     Result     Pap Smear    Normal_XXX__  Not Normal_____       Comments:  Everything was normal.  I will see you in one year for your next PAP smear.  Thank you for coming to the Center for your care.   Sincerely,    Jennet Maduro [facility]

## 2010-06-11 ENCOUNTER — Other Ambulatory Visit: Payer: Self-pay | Admitting: Adult Health

## 2010-06-11 ENCOUNTER — Encounter: Payer: Self-pay | Admitting: Adult Health

## 2010-06-11 ENCOUNTER — Ambulatory Visit (INDEPENDENT_AMBULATORY_CARE_PROVIDER_SITE_OTHER): Payer: Medicaid Other | Admitting: Adult Health

## 2010-06-11 DIAGNOSIS — B20 Human immunodeficiency virus [HIV] disease: Secondary | ICD-10-CM

## 2010-06-11 DIAGNOSIS — Z23 Encounter for immunization: Secondary | ICD-10-CM

## 2010-06-11 DIAGNOSIS — G44209 Tension-type headache, unspecified, not intractable: Secondary | ICD-10-CM | POA: Insufficient documentation

## 2010-06-12 LAB — T-HELPER CELL (CD4) - (RCID CLINIC ONLY): CD4 T Cell Abs: 280 uL — ABNORMAL LOW (ref 400–2700)

## 2010-06-28 ENCOUNTER — Telehealth: Payer: Self-pay | Admitting: *Deleted

## 2010-07-04 ENCOUNTER — Encounter: Payer: Self-pay | Admitting: Infectious Disease

## 2010-07-07 LAB — T-HELPER CELL (CD4) - (RCID CLINIC ONLY)
CD4 % Helper T Cell: 11 % — ABNORMAL LOW (ref 33–55)
CD4 T Cell Abs: 110 uL — ABNORMAL LOW (ref 400–2700)

## 2010-07-09 NOTE — Assessment & Plan Note (Signed)
Vital Signs:  Patient profile:   50 year old female Height:      63 inches Weight:      137.3 pounds BMI:     24.41 Temp:     98.2 degrees F oral Pulse rate:   70 / minute BP sitting:   149 / 94  (left arm)  Vitals Entered By: Alesia Morin CMA (June 11, 2010 10:54 AM) CC: follow-up visit for labs she says she has been having headaches and is lightheaded sometimes. Pt concerned about BP possible HTN Is Patient Diabetic? No Pain Assessment Patient in pain? no      Nutritional Status BMI of 19 -24 = normal Nutritional Status Detail appetite "good"  Have you ever been in a relationship where you felt threatened, hurt or afraid?No   Does patient need assistance? Functional Status Self care Ambulation Normal Comments no missed doses   CC:  follow-up visit for labs she says she has been having headaches and is lightheaded sometimes. Pt concerned about BP possible HTN.  History of Present Illness: Presents with ongoing c/o headaches that begin in the back of her neck and migrate diffusely throughout head.  Associated nausea and light sensitivity with HA's.  Pain described as dull ache to throbbing in nature.  No vomitting, syncope, or mental confusion reported.  Does relate increased stressors at home of domestic nature.  Long-term hx of polysubstance abuse,g use.  Preventive Screening-Counseling & Management  Alcohol-Tobacco     Alcohol drinks/day: 0     Smoking Status: quit < 6 months     Packs/Day: 6 cigs     Year Quit: 10/2009  Caffeine-Diet-Exercise     Caffeine use/day: tea occasional     Does Patient Exercise: yes     Type of exercise: walking  Safety-Violence-Falls     Seat Belt Use: yes      Sexual History:  n/a.        Drug Use:  former.        Blood Transfusions:  no.    Comments: pt declined condoms  Allergies (verified): No Known Drug Allergies  Past History:  Past medical, surgical, family and social histories (including risk factors)  reviewed for relevance to current acute and chronic problems.  Past Medical History: Reviewed history from 05/15/2006 and no changes required. HIV disease Thrush  Family History: Reviewed history from 07/28/2006 and no changes required. Family History Depression  Social History: Reviewed history and no changes required. Drug Use:  former Blood Transfusions:  no  Review of Systems       The patient complains of anorexia, headaches, and depression.  The patient denies fever, weight loss, weight gain, vision loss, decreased hearing, hoarseness, chest pain, syncope, dyspnea on exertion, peripheral edema, prolonged cough, hemoptysis, abdominal pain, melena, hematochezia, severe indigestion/heartburn, hematuria, incontinence, genital sores, muscle weakness, suspicious skin lesions, transient blindness, difficulty walking, unusual weight change, abnormal bleeding, enlarged lymph nodes, angioedema, and breast masses.         See HPI  Physical Exam  General:  alert, well-developed, well-nourished, and well-hydrated.   Head:  normocephalic and atraumatic.   Eyes:  vision grossly intact, pupils equal, pupils round, and pupils reactive to light.   Ears:  R ear normal and L ear normal.   Nose:  no external deformity and no nasal discharge.   Mouth:  pharynx pink and moist.   Neck:  supple, full ROM, and no masses.  Marked muscle tension along trapezious and SCM muscle  groups. Chest Wall:  no deformities, no tenderness, and no mass.   Breasts:  skin/areolae normal, no masses, no abnormal thickening, and no nipple discharge.   Lungs:  normal respiratory effort and normal breath sounds.   Heart:  normal rate, regular rhythm, no murmur, no gallop, and no rub.   Abdomen:  soft, non-tender, and normal bowel sounds.   Msk:  normal ROM.   Extremities:  No clubbing, cyanosis, edema, or deformity noted with normal full range of motion of all joints.   Neurologic:  alert & oriented X3, cranial nerves  II-XII intact, strength normal in all extremities, and gait normal.   Skin:  turgor normal, color normal, and no rashes.   Cervical Nodes:  Shoddy LAD A&P Psych:  Oriented X3, memory intact for recent and remote, subdued, and poor eye contact.      Impression & Recommendations:  Problem # 1:  HEADACHE, TENSION (ICD-307.81) Relaxation measures reviewed, will try fiorinal 1 by mouth q 4h as needed headache.   Her updated medication list for this problem includes:    Butalbital Compound/asa 50-325-40 Mg Tabs (Butalbital-aspirin-caffeine) .Marland Kitchen... Take 1 tablet by mouth q4h as needed  headache or 2 tablets by mouth q6h as needed not to exceed 6 tablets in 24 hours.  Problem # 2:  UNSPECIFIED FAMILY CIRCUMSTANCE (ICD-V61.9) Encouraged to work with case manager to address measures on how to manage personal family problems.  Verbally agreed to this and will contact case manager tomorrow. She is to have staging labs today and f/u in 2-3 weeks.  Medications Added to Medication List This Visit: 1)  Butalbital Compound/asa 50-325-40 Mg Tabs (Butalbital-aspirin-caffeine) .... Take 1 tablet by mouth q4h as needed  headache or 2 tablets by mouth q6h as needed not to exceed 6 tablets in 24 hours.  Other Orders: Est. Patient Level IV (29562) T-CD4SP ([facility]) (CD4SP) Hepatitis A Vaccine (Adult Dose) (13086) Admin 1st Vaccine (57846) Prescriptions: BUTALBITAL COMPOUND/ASA 50-325-40 MG TABS (BUTALBITAL-ASPIRIN-CAFFEINE) Take 1 tablet by mouth q4h as needed  headache OR 2 tablets by mouth q6h as needed NOT TO EXCEED 6 TABLETS IN 24 HOURS.  #50 x 0   Entered by:   Wendall Mola CMA ( AAMA)   Authorized by:   Talmadge Chad NP   Signed by:   Wendall Mola CMA ( AAMA) on 06/28/2010   Method used:   Telephoned to ...       [facility] (mail-order)       [address]       [place], [place]  [address]       Ph: [phone]       Fax: [phone]   RxID:    [id number]    Orders Added: 1)  Est. Patient Level IV [33295] 2)  T-CD4SP ([facility]) [CD4SP] 3)  Hepatitis A Vaccine (Adult Dose) [90632] 4)  Admin 1st Vaccine [90471]   Immunizations Administered:  Hepatitis A Vaccine # 1:    Vaccine Type: HepA    Site: left deltoid    Mfr: GlaxoSmithKline    Dose: 1.0 ml    Route: IM    Given by: Alesia Morin CMA    Exp. Date: 04/03/2012    Lot #: [id number]    VIS given: 07/09/04 version given June 11, 2010.   Immunizations Administered:  Hepatitis A Vaccine # 1:    Vaccine Type: HepA    Site: left deltoid    Mfr: GlaxoSmithKline    Dose: 1.0 ml  Route: IM    Given by: Alesia Morin CMA    Exp. Date: 04/03/2012    Lot #: [id number]    VIS given: 07/09/04 version given June 11, 2010.

## 2010-07-09 NOTE — Progress Notes (Addendum)
Summary: office note needs to be faxed  Phone Note Call from Patient   Caller: Patient Summary of Call: Pt. 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  Fax # is [phone],  needs to be received by 07/02/10 Initial call taken by: Wendall Mola CMA Duncan Dull),  June 28, 2010 4:18 PM  Follow-up for Phone Call        Last OV information faxed to [facility] [phone] as per patients request. Alesia Morin CMA  July 02, 2010 9:23 AM Follow-up by: Alesia Morin CMA,  July 02, 2010 9:23 AM

## 2010-07-14 LAB — T-HELPER CELL (CD4) - (RCID CLINIC ONLY): CD4 T Cell Abs: 110 uL — ABNORMAL LOW (ref 400–2700)

## 2010-07-31 LAB — DIFFERENTIAL
Band Neutrophils: 1 % (ref 0–10)
Blasts: 0 %
Metamyelocytes Relative: 0 %
Monocytes Absolute: 0.5 10*3/uL (ref 0.1–1.0)
Monocytes Relative: 8 % (ref 3–12)
Myelocytes: 0 %
Promyelocytes Absolute: 0 %

## 2010-07-31 LAB — COMPREHENSIVE METABOLIC PANEL
ALT: 23 U/L (ref 0–35)
AST: 40 U/L — ABNORMAL HIGH (ref 0–37)
Albumin: 3.3 g/dL — ABNORMAL LOW (ref 3.5–5.2)
Alkaline Phosphatase: 82 U/L (ref 39–117)
Chloride: 109 mEq/L (ref 96–112)
GFR calc Af Amer: >60 mL/min (ref >60)
Potassium: 4.4 mEq/L (ref 3.5–5.1)
Sodium: 137 mEq/L (ref 135–145)
Total Bilirubin: 0.5 mg/dL (ref 0.3–1.2)

## 2010-07-31 LAB — CBC
Platelets: 368 10*3/uL (ref 150–400)
RBC: 4.4 MIL/uL (ref 3.87–5.11)
WBC: 6.3 10*3/uL (ref 4.0–10.5)

## 2010-07-31 LAB — RAPID URINE DRUG SCREEN, HOSP PERFORMED
Amphetamines: NOT DETECTED
Benzodiazepines: POSITIVE — AB

## 2010-07-31 LAB — ETHANOL: Alcohol, Ethyl (B): <5 mg/dL (ref 0–10)

## 2010-08-01 ENCOUNTER — Other Ambulatory Visit: Payer: Self-pay | Admitting: *Deleted

## 2010-08-20 ENCOUNTER — Other Ambulatory Visit: Payer: Medicaid Other

## 2010-09-06 NOTE — Discharge Summary (Signed)
NAME:  Courtney Ellis, Courtney Ellis                           ACCOUNT NO.:  [id number]   MEDICAL RECORD NO.:  [id number]                   PATIENT TYPE:  INP   LOCATION:  9179                                 FACILITY:  [facility]   PHYSICIAN:  Tanya S. Shawnie Pons, M.D.                DATE OF BIRTH:  08-10-1960   DATE OF ADMISSION:  01/19/2002  DATE OF DISCHARGE:  01/20/2002                                 DISCHARGE SUMMARY   DISCHARGE DIAGNOSES:  1. Viable intrauterine pregnancy at 22-2/[redacted] weeks gestation by obstetrical     ultrasound.  2. Heroin abuse.  3. Withdrawal from heroin.   DISCHARGE MEDICATIONS:  None, as per [facility].   FOLLOW UP:  The patient is to follow up at high-risk clinic upon discharge  from [facility].  The patient should call for an appointment - that number is [phone].  Additionally, if [facility] requests, Dr. Okey Dupre will be happy to see the  patient at [facility].   ADMISSION PROCEDURES AND DIAGNOSTIC STUDIES:  OB ultrasound which shows a  single gestation in cephalic presentation with a heartbeat of 153, anterior  placenta grade 1, normal AFI, ultrasound [facility] May 26, 2002, female gender  infant, cervix 3.5 cm transabdominally.   HISTORY OF PRESENT ILLNESS AND HOSPITAL COURSE:  The patient is a 50-year-  old G3, P2 at 18-0/7 weeks by unsure LMP who presents in heroin withdrawal,  her last use was Tuesday morning at 0500 hours, she denies contractions or  vaginal bleeding, her membranes are intact and she does have fetal activity.  The patient states that she stopped using heroin because I'm tired.  She  has stopped in the past but has restarted by hanging around same people.  She has also tried methadone in the past.  Her withdrawal symptoms consist  of abdominal cramps, vomiting and diarrhea as well as anxiety.  The patient  has had no prenatal care in this pregnancy and the pregnancy is unwanted  however, she has no insurance and could not afford a TAB and is unwilling to  consider adoption for this  child.   MEDICATIONS:  No medications.   ALLERGIES:  No known drug allergies.   PAST OBSTETRICAL HISTORY:  Two previous SVDs.   PAST GYNECOLOGIC HISTORY:  The patient denies any history of STDs.   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  She does acknowledge alcohol and tobacco use.   HOSPITAL COURSE:  1. HEROIN WITHDRAWAL.  The patient was given Ativan 1 mg IV q.6h. p.r.n.     This controlled her symptoms well and she had no further difficulties.     The patient will go to [facility] for further  detox and help in remaining clean     and sober.   1. PREGNANCY.  Despite the fact that this is not a wanted pregnancy, the     patient does apparently intend to have this pregnancy and refuses to     consider adoption.  I would strongly suggest social work involvement at     time of delivery.  The patient will follow up at the high-risk clinic for     her prenatal care.  She should call for an appointment at her discharge     from [facility].   DISCHARGE LABORATORIES:  WBC 14.1, hemoglobin 11.7, platelets 322.  Sickle  cell screen pending.  Hepatitis B surface antigen negative, HIV negative, B  positive antibody negative, RPR negative, rubella immune.   DISPOSITION:  The patient was discharged to [facility] via EMS without further  difficulty.     Jonah Blue, M.D.                      Shelbie Proctor. Shawnie Pons, M.D.    Milas Gain  D:  01/20/2002  T:  01/20/2002  Job:  [id number]   cc:   [facility]   [facility]

## 2010-09-06 NOTE — H&P (Signed)
Courtney Ellis, Courtney Ellis                ACCOUNT NO.:  [id number]   MEDICAL RECORD NO.:  [id number]          PATIENT TYPE:  EMS   LOCATION:  ED                           FACILITY:  [facility]   PHYSICIAN:  Kela Millin, M.D.DATE OF BIRTH:  15-Apr-1961   DATE OF ADMISSION:  11/26/2005  DATE OF DISCHARGE:                                HISTORY & PHYSICAL   PRIMARY CARE PHYSICIAN:  Unassigned.   CHIEF COMPLAINT:  Somnolence and fevers.   HISTORY OF PRESENT ILLNESS:  The patient is a 50 year old black female with  a past medical history significant for HIV, off all antiretrovirals times  about 2 months since she left prison, polysubstance abuse, who presents with  above complaints.  The history is mostly obtained from the ER staff and  chart review due to patient's somnolence.  Per ER physician, EMS was called  by patient's family because she was difficult to arouse and when they woke  her up, they also noted that she was having fevers.  The patient stated that  she had taken a sleeping pill and that was why she was so sleepy.  She  reports that for the past 3-4 days she has had fevers, she admits to a cough  productive of clear phlegm times a couple of days.  She denies dysuria,  diarrhea, and no abdominal pain.  The patient also denies chest pain,  hemoptysis, nausea or vomiting, melena, and no hematochezia.  It was noted  in the ER records that the patient had earlier reported a backache but on  interviewing her in the ER, she stated that she just had been feeling  feverish but denies backaches.  She admits to headaches, denies cold/URI  symptoms.   The patient was seen in the ER and was noted to have a temperature of 103.8  and a chest x-ray was done.  It showed no acute findings, mild central  airway thickening without infiltrate.  A CT scan of her head was also done,  and it showed no acute intracranial findings with clear sinuses.  Urinalysis  was negative for a UTI and a urine drug  screen was positive for cocaine,  marijuana, benzodiazepines and opiates.  The patient was also noted to have  electrolyte abnormalities, and she is admitted to the [facility]  service for further evaluation and management.   PAST MEDICAL HISTORY:  1. As stated above.  2. History of heroin abuse.   ALLERGIES:  No known drug allergies.   SOCIAL HISTORY:  Positive for tobacco.  She denies alcohol.  She also denies  illicit drug use.   FAMILY HISTORY:  Her dad has diabetes, also unknown cancer.   REVIEW OF SYSTEMS:  As per HPI, otherwise unobtainable.   PHYSICAL EXAMINATION:  GENERAL:  The patient is a middle-aged black female,  somnolent.  She arouses easily and answers questions appropriately but only  momentarily and falls back to sleep.  No respiratory distress.  VITAL SIGNS:  Temperature 98.7, initially 103.8, blood pressure 120/72,  pulse of 76, respiratory rate of 20, O2 saturation of 98%.  HEENT:  Normocephalic, atraumatic, slightly dry mucous membranes.  No oral  exudates.  Sclerae are anicteric.  NECK:  Supple.  No adenopathy and no thyromegaly.  LUNGS:  Clear to auscultation bilaterally.  No crackles or wheezes.  CARDIOVASCULAR:  Regular rate and rhythm, normal S1, S2, no murmurs  appreciated.  ABDOMEN:  Soft, bowel sounds present, nontender, nondistended, no  organomegaly and no masses palpable.  EXTREMITIES:  No cyanosis and no edema.  NEUROLOGIC:  Somnolent, arouses to voice and touch, moving all extremities  grossly, follows simple commands.  Cranial nerves II-XII grossly intact.   LABORATORY DATA:  Chest x-ray shows no acute findings, mild central airway  thickening without infiltrate.  Urinalysis:  Specific gravity is 1.027 and  urine protein is greater than 300, leukocyte esterase negative, urine wbc's  0-2, few bacteria.  Urine drug screen positive for opiates, cocaine,  benzodiazepines and marijuana.  Her sodium is 132, potassium is 3.3,  chloride 101,  CO2 is 24, glucose 105, BUN is 12, creatinine 0.9.  Calcium is  8.1.  Total protein 7.5.  LFTs are unremarkable.  Alcohol level is less than  5.  White cell count 6.8, hemoglobin 10.3, hematocrit 30.5, platelet count  276.  Neutrophil count 79%.  CT scan of the head:  No acute intracranial  findings.  Sinuses clear.   ASSESSMENT AND PLAN:  1. Drug intoxication/polysubstance abuse.  Likely cause of decreased level      of consciousness/somnolence.  CT scan as above negative for acute      intracranial findings and urine drug screen positive for cocaine,      marijuana, opiates and benzodiazepines.  Supportive care, follow.  2. Fevers.  Human immunodeficiency virus-positive patient, unclear source.      Patient with complaints of cough but chest x-ray only with central      airway thickening without infiltrate.  Will obtain blood and urine      cultures.  Empiric antibiotics to cover her lungs.  Will also obtain a      CD4 count, viral load for her, and consider infectious disease consult.  3. Human immunodeficiency virus positive.  As noted above, she was on      medications while on prison but off all antiretrovirals times about 2      months, no follow-up since she left prison.  Will obtain a CD4 count, a      viral load, and will consult infectious disease as above.  4. Hypokalemia.  Replace potassium.  5. History of heroin abuse.      Kela Millin, M.D.  Electronically Signed     ACV/MEDQ  D:  11/27/2005  T:  11/27/2005  Job:  [id number]

## 2010-09-09 ENCOUNTER — Ambulatory Visit: Payer: Medicaid Other | Admitting: Adult Health

## 2010-10-03 ENCOUNTER — Inpatient Hospital Stay (INDEPENDENT_AMBULATORY_CARE_PROVIDER_SITE_OTHER)
Admission: RE | Admit: 2010-10-03 | Discharge: 2010-10-03 | Disposition: A | Discharge status: Home / Self Care | Payer: Self-pay | Source: Ambulatory Visit | Attending: Family Medicine | Admitting: Family Medicine

## 2010-10-03 ENCOUNTER — Ambulatory Visit (INDEPENDENT_AMBULATORY_CARE_PROVIDER_SITE_OTHER): Payer: Medicaid Other

## 2010-10-03 DIAGNOSIS — R071 Chest pain on breathing: Secondary | ICD-10-CM

## 2010-11-27 ENCOUNTER — Other Ambulatory Visit: Payer: Self-pay | Admitting: Adult Health

## 2010-11-27 DIAGNOSIS — Z113 Encounter for screening for infections with a predominantly sexual mode of transmission: Secondary | ICD-10-CM

## 2010-11-27 DIAGNOSIS — B20 Human immunodeficiency virus [HIV] disease: Secondary | ICD-10-CM

## 2010-11-27 DIAGNOSIS — Z79899 Other long term (current) drug therapy: Secondary | ICD-10-CM

## 2010-11-28 ENCOUNTER — Other Ambulatory Visit: Payer: Self-pay

## 2010-12-12 ENCOUNTER — Encounter: Payer: Self-pay | Admitting: Adult Health

## 2010-12-26 ENCOUNTER — Other Ambulatory Visit: Payer: Self-pay

## 2011-01-10 ENCOUNTER — Ambulatory Visit: Payer: Self-pay | Admitting: Adult Health

## 2011-01-13 LAB — T-HELPER CELL (CD4) - (RCID CLINIC ONLY): CD4 T Cell Abs: 110 — ABNORMAL LOW

## 2011-01-16 LAB — T-HELPER CELL (CD4) - (RCID CLINIC ONLY): CD4 % Helper T Cell: 7 — ABNORMAL LOW

## 2011-01-20 ENCOUNTER — Other Ambulatory Visit: Payer: Self-pay

## 2011-01-24 ENCOUNTER — Other Ambulatory Visit: Payer: Self-pay

## 2011-01-27 LAB — T-HELPER CELL (CD4) - (RCID CLINIC ONLY): CD4 % Helper T Cell: 4 — ABNORMAL LOW

## 2011-01-30 ENCOUNTER — Other Ambulatory Visit: Payer: Self-pay

## 2011-02-03 ENCOUNTER — Ambulatory Visit: Payer: Self-pay | Admitting: Infectious Disease

## 2011-02-03 LAB — T-HELPER CELL (CD4) - (RCID CLINIC ONLY)
CD4 % Helper T Cell: 4 — ABNORMAL LOW
CD4 T Cell Abs: 110 — ABNORMAL LOW

## 2011-02-07 ENCOUNTER — Other Ambulatory Visit: Payer: Medicaid Other

## 2011-02-07 DIAGNOSIS — B20 Human immunodeficiency virus [HIV] disease: Secondary | ICD-10-CM

## 2011-02-07 DIAGNOSIS — Z113 Encounter for screening for infections with a predominantly sexual mode of transmission: Secondary | ICD-10-CM

## 2011-02-07 DIAGNOSIS — Z79899 Other long term (current) drug therapy: Secondary | ICD-10-CM

## 2011-02-07 LAB — T-HELPER CELL (CD4) - (RCID CLINIC ONLY): CD4 T Cell Abs: 140 uL — ABNORMAL LOW (ref 400–2700)

## 2011-02-08 LAB — LIPID PANEL
HDL: 56 mg/dL (ref >39)
LDL Cholesterol: 93 mg/dL (ref 0–99)
Total CHOL/HDL Ratio: 3.4 Ratio

## 2011-02-08 LAB — COMPREHENSIVE METABOLIC PANEL
AST: 29 U/L (ref 0–37)
Albumin: 3.8 g/dL (ref 3.5–5.2)
Alkaline Phosphatase: 85 U/L (ref 39–117)
Potassium: 4.5 mEq/L (ref 3.5–5.3)
Sodium: 134 mEq/L — ABNORMAL LOW (ref 135–145)
Total Protein: 8 g/dL (ref 6.0–8.3)

## 2011-02-08 LAB — CBC WITH DIFFERENTIAL/PLATELET
Basophils Absolute: 0 10*3/uL (ref 0.0–0.1)
Basophils Relative: 0 % (ref 0–1)
Hemoglobin: 12.7 g/dL (ref 12.0–15.0)
MCHC: 31.9 g/dL (ref 30.0–36.0)
Neutro Abs: 3.8 10*3/uL (ref 1.7–7.7)
Neutrophils Relative %: 57 % (ref 43–77)
RDW: 14.1 % (ref 11.5–15.5)

## 2011-02-11 ENCOUNTER — Telehealth: Payer: Self-pay | Admitting: *Deleted

## 2011-02-11 ENCOUNTER — Emergency Department (HOSPITAL_COMMUNITY)
Admission: EM | Admit: 2011-02-11 | Discharge: 2011-02-11 | Discharge status: Against Medical Advice | Payer: Medicaid Other | Attending: Emergency Medicine | Admitting: Emergency Medicine

## 2011-02-11 ENCOUNTER — Inpatient Hospital Stay (INDEPENDENT_AMBULATORY_CARE_PROVIDER_SITE_OTHER)
Admission: RE | Admit: 2011-02-11 | Discharge: 2011-02-11 | Disposition: A | Discharge status: Home / Self Care | Payer: Medicaid Other | Source: Ambulatory Visit | Attending: Family Medicine | Admitting: Family Medicine

## 2011-02-11 DIAGNOSIS — I10 Essential (primary) hypertension: Secondary | ICD-10-CM

## 2011-02-11 DIAGNOSIS — R51 Headache: Secondary | ICD-10-CM

## 2011-02-11 DIAGNOSIS — Z0389 Encounter for observation for other suspected diseases and conditions ruled out: Secondary | ICD-10-CM | POA: Insufficient documentation

## 2011-02-11 NOTE — Telephone Encounter (Signed)
She came in stating her bp was high. Used a friend's machine to check it. Says she has been having headaches, blurry vision, dizziness & spots in her eyes for a week. This am her bp at home was 181/103, 174/109. Today in office was 201/109 on R arm, 198/116 on L arm. States she is not on bp meds. Does not have a pcp. Urged her to find one & go there for all non-HIV issues. I walked her to the door & pointed out where the Urgent care was. She left & was going there now. We did not have a md available to see her

## 2011-02-12 ENCOUNTER — Telehealth: Payer: Self-pay | Admitting: *Deleted

## 2011-02-12 NOTE — Telephone Encounter (Signed)
I called to check on her. States she waited at [facility] until 8:30 & then left. Says her bp is down to 160/102. I asked her to get seen today. States her medicaid card shows Dr. Thea Silversmith. She is going to call him & try to get seen today. Told her she needed to get seen asap for this persistant bp. May need meds. States she will go

## 2011-02-13 ENCOUNTER — Telehealth: Payer: Self-pay | Admitting: *Deleted

## 2011-02-13 NOTE — Telephone Encounter (Signed)
She says she feels better. BP was 145/105 this am. Seeing Dr. Ronne Binning at 5pm today. States her medicaid card says out patient clinic. Told her to call there to get established. States she will

## 2011-02-26 ENCOUNTER — Ambulatory Visit: Payer: Medicaid Other | Admitting: Infectious Diseases

## 2011-03-04 ENCOUNTER — Ambulatory Visit (INDEPENDENT_AMBULATORY_CARE_PROVIDER_SITE_OTHER): Payer: Medicaid Other | Admitting: Internal Medicine

## 2011-03-04 ENCOUNTER — Encounter: Payer: Self-pay | Admitting: Internal Medicine

## 2011-03-04 VITALS — BP 158/96 | HR 80 | Temp 98.4°F | Ht 64.0 in | Wt 155.0 lb

## 2011-03-04 DIAGNOSIS — R030 Elevated blood-pressure reading, without diagnosis of hypertension: Secondary | ICD-10-CM

## 2011-03-04 DIAGNOSIS — Z23 Encounter for immunization: Secondary | ICD-10-CM

## 2011-03-04 DIAGNOSIS — B20 Human immunodeficiency virus [HIV] disease: Secondary | ICD-10-CM

## 2011-03-04 MED ORDER — EMTRICITAB-RILPIVIR-TENOFOV DF 200-25-300 MG PO TABS: 1.0000 | ORAL_TABLET | Freq: Every day | ORAL | Status: DC

## 2011-03-04 NOTE — Assessment & Plan Note (Signed)
The patient has not tolerated her medicines as well as could be and I did talk to her about different treatment options. I discussed the need to take medications every day for the them to be effective and not develop resistance and of the different treatment options she did decide on Complera. She is aware of the inability to take antacids and and the need to take it with food which he is feels she is able to do it in the a.m. Therefore I have prescribed her Complera but she is going to start tomorrow. She will return in one month's time for followup labs to assure it is working and not resistant. She will followup on his labs as well to assure good compliance and tolerance of the medication. Next  I did stress the need to continue using condoms as well to protect herself and others.

## 2011-03-04 NOTE — Patient Instructions (Signed)
Start in am with food, including fat.  Avoid antiacids.

## 2011-03-04 NOTE — Progress Notes (Signed)
  Subjective:    Patient ID: Courtney Ellis, female    DOB: July 08, 1960, 50 y.o.   MRN: [id number]  HPIthe patient comes in today for a followup visit of her HIV. The patient reports that she's been off of her medications for several months and she has not had that filled and she has not been tolerating them well. She does state that she has some GI upset. She also has noted that her blood pressures been elevated recently and was to see her primary physician, though has had difficulty getting in to see him. She does have another primary physician however that she is going to see to help her manage her blood pressure. Today she has no particular complaints including no dysphagia, diarrhea or GI upset at this time.    Review of Systems  Constitutional: Negative for fever, activity change, fatigue and unexpected weight change.  Respiratory: Negative for cough, shortness of breath and wheezing.   Cardiovascular: Negative for chest pain.  Gastrointestinal: Negative for nausea, abdominal pain, diarrhea and constipation.  Genitourinary: Negative for frequency, hematuria and genital sores.  Hematological: Negative for adenopathy.  Psychiatric/Behavioral: Negative for dysphoric mood.       Objective:   Physical Exam  Constitutional: She is oriented to person, place, and time. She appears well-developed and well-nourished. No distress.  HENT:  Mouth/Throat: Oropharynx is clear and moist. No oropharyngeal exudate.  Cardiovascular: Normal rate, regular rhythm and normal heart sounds.   No murmur heard. Pulmonary/Chest: Effort normal and breath sounds normal. No respiratory distress. She has no wheezes.  Abdominal: Soft. Bowel sounds are normal. She exhibits no distension. There is no tenderness.  Lymphadenopathy:    She has no cervical adenopathy.  Neurological: She is alert and oriented to person, place, and time.  Skin: Skin is warm and dry. No rash noted. No erythema.  Psychiatric: She has a  normal mood and affect. Her behavior is normal.          Assessment & Plan:

## 2011-03-04 NOTE — Assessment & Plan Note (Signed)
I did express my concern with her blood pressure elevation she is going to see her primary physician today she thinks otherwise I told her if she is unable to see her primary physician in a timely manner we can prescribe her medications and follow her blood blood pressure here until she is able to get into her physician.

## 2011-03-06 ENCOUNTER — Other Ambulatory Visit: Payer: Self-pay | Admitting: Licensed Clinical Social Worker

## 2011-03-06 DIAGNOSIS — B20 Human immunodeficiency virus [HIV] disease: Secondary | ICD-10-CM

## 2011-03-06 MED ORDER — EMTRICITAB-RILPIVIR-TENOFOV DF 200-25-300 MG PO TABS: 1.0000 | ORAL_TABLET | Freq: Every day | ORAL | Status: DC

## 2011-04-03 ENCOUNTER — Other Ambulatory Visit: Payer: Medicaid Other

## 2011-04-04 ENCOUNTER — Other Ambulatory Visit: Payer: Medicaid Other

## 2011-04-10 ENCOUNTER — Other Ambulatory Visit: Payer: Medicaid Other

## 2011-04-10 ENCOUNTER — Other Ambulatory Visit: Payer: Self-pay | Admitting: Infectious Diseases

## 2011-04-10 DIAGNOSIS — B20 Human immunodeficiency virus [HIV] disease: Secondary | ICD-10-CM

## 2011-04-11 LAB — CBC WITH DIFFERENTIAL/PLATELET
Basophils Absolute: 0 10*3/uL (ref 0.0–0.1)
Basophils Relative: 0 % (ref 0–1)
Eosinophils Relative: 2 % (ref 0–5)
HCT: 35.1 % — ABNORMAL LOW (ref 36.0–46.0)
MCHC: 32.2 g/dL (ref 30.0–36.0)
MCV: 85.6 fL (ref 78.0–100.0)
Monocytes Absolute: 0.5 10*3/uL (ref 0.1–1.0)
RDW: 13.9 % (ref 11.5–15.5)

## 2011-04-11 LAB — T-HELPER CELL (CD4) - (RCID CLINIC ONLY): CD4 T Cell Abs: 130 uL — ABNORMAL LOW (ref 400–2700)

## 2011-04-11 LAB — COMPLETE METABOLIC PANEL WITH GFR
AST: 31 U/L (ref 0–37)
Albumin: 3.6 g/dL (ref 3.5–5.2)
Alkaline Phosphatase: 80 U/L (ref 39–117)
Potassium: 4.3 mEq/L (ref 3.5–5.3)
Sodium: 138 mEq/L (ref 135–145)
Total Protein: 7.3 g/dL (ref 6.0–8.3)

## 2011-04-28 ENCOUNTER — Other Ambulatory Visit: Payer: Self-pay | Admitting: Internal Medicine

## 2011-04-28 LAB — HIV-1 GENOTYPR PLUS

## 2011-04-28 MED ORDER — RALTEGRAVIR POTASSIUM 400 MG PO TABS: 400.0000 mg | ORAL_TABLET | Freq: Two times a day (BID) | ORAL | Status: DC

## 2011-05-01 ENCOUNTER — Telehealth: Payer: Self-pay | Admitting: *Deleted

## 2011-05-01 ENCOUNTER — Telehealth: Payer: Self-pay | Admitting: Licensed Clinical Social Worker

## 2011-05-01 ENCOUNTER — Ambulatory Visit: Payer: Medicaid Other | Admitting: Internal Medicine

## 2011-05-01 NOTE — Telephone Encounter (Signed)
Patient called back and she was rescheduled per Dr. Luciana Axe on 05/06/11 at 11:15am.

## 2011-05-01 NOTE — Telephone Encounter (Signed)
Called patient because she was a No-Show for her visit today. The home number listed is not in service and the mobile number listed was to a friend. Was not able to get in contact with the patient.

## 2011-05-06 ENCOUNTER — Ambulatory Visit: Payer: Medicaid Other | Admitting: Internal Medicine

## 2011-05-06 ENCOUNTER — Telehealth: Payer: Self-pay | Admitting: *Deleted

## 2011-05-06 NOTE — Telephone Encounter (Signed)
Called and left message for patient to reschedule appointment.  She no showed today. Wendall Mola CMA

## 2011-05-20 ENCOUNTER — Ambulatory Visit (INDEPENDENT_AMBULATORY_CARE_PROVIDER_SITE_OTHER): Payer: Medicaid Other | Admitting: Internal Medicine

## 2011-05-20 ENCOUNTER — Encounter: Payer: Self-pay | Admitting: Internal Medicine

## 2011-05-20 VITALS — BP 150/81 | HR 80 | Temp 98.2°F | Ht 63.0 in | Wt 151.0 lb

## 2011-05-20 DIAGNOSIS — G44209 Tension-type headache, unspecified, not intractable: Secondary | ICD-10-CM

## 2011-05-20 DIAGNOSIS — Z23 Encounter for immunization: Secondary | ICD-10-CM

## 2011-05-20 DIAGNOSIS — B20 Human immunodeficiency virus [HIV] disease: Secondary | ICD-10-CM

## 2011-05-20 MED ORDER — EMTRICITABINE-TENOFOVIR DF 200-300 MG PO TABS: 1.0000 | ORAL_TABLET | Freq: Every day | ORAL | Status: DC

## 2011-05-20 MED ORDER — RALTEGRAVIR POTASSIUM 400 MG PO TABS: 400.0000 mg | ORAL_TABLET | Freq: Two times a day (BID) | ORAL | Status: DC

## 2011-05-20 MED ORDER — RITONAVIR 100 MG PO TABS: 100.0000 mg | ORAL_TABLET | Freq: Every day | ORAL | Status: DC

## 2011-05-20 MED ORDER — BUTALBITAL-ASPIRIN-CAFFEINE 50-325-40 MG PO TABS: 1.0000 | ORAL_TABLET | ORAL | Status: DC | PRN

## 2011-05-20 MED ORDER — SULFAMETHOXAZOLE-TMP DS 800-160 MG PO TABS: 1.0000 | ORAL_TABLET | Freq: Every day | ORAL | Status: DC

## 2011-05-20 MED ORDER — DARUNAVIR ETHANOLATE 800 MG PO TABS: 1.0000 | ORAL_TABLET | Freq: Every day | ORAL | Status: DC

## 2011-05-20 NOTE — Progress Notes (Signed)
  Subjective:    Patient ID: Courtney Ellis, female    DOB: 22-Dec-1960, 51 y.o.   MRN: [id number]  HPI she comes in for followup of her HIV. 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 She has since stopped her complainer and is here to reassess treatment options. She does admit to several missed doses with the Complera. She otherwise has had no new issues but does have occasional episodes of anxiety. She also has continued headache that is described as bilateral, dull, achy and occurs midfrontal area as well as the occipital area. She does occasionally also get throbbing pain with a headache that is relieved by the migraine medicine.    Review of Systems  Constitutional: Negative for fever, appetite change, fatigue and unexpected weight change.  HENT: Negative for sore throat and trouble swallowing.   Respiratory: Negative for cough, choking and shortness of breath.   Cardiovascular: Negative for chest pain, palpitations and leg swelling.  Gastrointestinal: Negative for abdominal pain and diarrhea.  Genitourinary: Negative for genital sores, menstrual problem and pelvic pain.  Musculoskeletal: Negative for myalgias and arthralgias.  Skin: Negative for pallor and rash.  Neurological: Positive for headaches. Negative for dizziness and light-headedness.  Hematological: Negative for adenopathy.  Psychiatric/Behavioral: Negative for sleep disturbance and dysphoric mood. The patient is nervous/anxious.        Objective:   Physical Exam  Constitutional: She is oriented to person, place, and time. She appears well-developed and well-nourished. No distress.  HENT:  Mouth/Throat: Oropharynx is clear and moist. No oropharyngeal exudate.  Cardiovascular: Normal rate, regular rhythm and normal heart sounds.  Exam reveals no gallop and no friction rub.   No murmur  heard. Pulmonary/Chest: Effort normal and breath sounds normal. No respiratory distress. She has no wheezes. She has no rales.  Abdominal: Soft. Bowel sounds are normal. She exhibits no distension. There is no tenderness. There is no rebound.  Lymphadenopathy:    She has no cervical adenopathy.  Neurological: She is alert and oriented to person, place, and time.  Skin: Skin is warm and dry. No rash noted. No erythema.          Assessment & Plan:

## 2011-05-20 NOTE — Patient Instructions (Signed)
Start taking your medicines and recheck the blood test in 3 weeks

## 2011-05-20 NOTE — Assessment & Plan Note (Signed)
I discussed the different treatment options in this patient with significant resistance. I did emphasize that starting treatment will require that she takes it as prescribed in order to prevent further resistance. I did also emphasize that any further resistance could make it very difficult to treat her in the future and her life expectancy would be much different. She did voice her understanding and is motivated to restart a new regimen with the anticipation of being compliant. I did remind her to use condoms with all sexual activity. She will be followed closely and have a followup lab tests in about 6 weeks.

## 2011-05-20 NOTE — Assessment & Plan Note (Signed)
She does continue to have problems with headache and by description appear to be consistent with tension headache. They are relieved by her current medications and so this will be continued.

## 2011-05-26 NOTE — Progress Notes (Signed)
This encounter was created in error - please disregard.

## 2011-06-12 ENCOUNTER — Other Ambulatory Visit: Payer: Medicaid Other

## 2011-06-13 ENCOUNTER — Other Ambulatory Visit: Payer: Medicaid Other

## 2011-06-13 ENCOUNTER — Ambulatory Visit: Payer: Medicaid Other

## 2011-06-26 ENCOUNTER — Ambulatory Visit: Payer: Medicaid Other | Admitting: Internal Medicine

## 2011-06-26 ENCOUNTER — Other Ambulatory Visit: Payer: Medicaid Other

## 2011-06-26 ENCOUNTER — Telehealth: Payer: Self-pay | Admitting: *Deleted

## 2011-06-26 NOTE — Telephone Encounter (Signed)
Patient no showed her lab appointment, unable to reach by phone.  [facility] referral made for multiple no shows. Wendall Mola CMA

## 2011-06-27 ENCOUNTER — Ambulatory Visit: Payer: Medicaid Other

## 2011-06-27 ENCOUNTER — Telehealth: Payer: Self-pay | Admitting: *Deleted

## 2011-06-27 NOTE — Telephone Encounter (Signed)
Message left to call [facility] to make another PAP smear appt.

## 2011-07-10 ENCOUNTER — Ambulatory Visit: Payer: Medicaid Other | Admitting: Internal Medicine

## 2011-07-21 ENCOUNTER — Encounter: Payer: Self-pay | Admitting: *Deleted

## 2011-07-21 ENCOUNTER — Other Ambulatory Visit: Payer: Medicaid Other

## 2011-08-04 ENCOUNTER — Ambulatory Visit: Payer: Medicaid Other | Admitting: Internal Medicine

## 2011-08-05 ENCOUNTER — Telehealth: Payer: Self-pay | Admitting: *Deleted

## 2011-08-05 NOTE — Telephone Encounter (Signed)
Spoke with patient regarding her missed lab and office visit. She is having issues with transportation and has rescheduled appointments and no showed several.  Asked her if she would be interested in working with [employer] and she is agreeable to this. Referral given to [facility] with [facility] and they will phone patient to arrange enrolling her in counseling. Wendall Mola CMA

## 2011-08-14 ENCOUNTER — Other Ambulatory Visit: Payer: Medicaid Other

## 2011-08-14 DIAGNOSIS — B20 Human immunodeficiency virus [HIV] disease: Secondary | ICD-10-CM

## 2011-08-14 LAB — CBC WITH DIFFERENTIAL/PLATELET
Eosinophils Absolute: 0.1 10*3/uL (ref 0.0–0.7)
Eosinophils Relative: 1 % (ref 0–5)
Hemoglobin: 12.1 g/dL (ref 12.0–15.0)
Lymphs Abs: 1.4 10*3/uL (ref 0.7–4.0)
MCH: 25.8 pg — ABNORMAL LOW (ref 26.0–34.0)
MCV: 82.7 fL (ref 78.0–100.0)
Monocytes Absolute: 0.5 10*3/uL (ref 0.1–1.0)
Monocytes Relative: 7 % (ref 3–12)
Platelets: 374 10*3/uL (ref 150–400)
RBC: 4.69 MIL/uL (ref 3.87–5.11)

## 2011-08-14 LAB — COMPREHENSIVE METABOLIC PANEL
BUN: 12 mg/dL (ref 6–23)
CO2: 28 mEq/L (ref 19–32)
Creat: 0.97 mg/dL (ref 0.50–1.10)
Glucose, Bld: 113 mg/dL — ABNORMAL HIGH (ref 70–99)
Total Bilirubin: 0.5 mg/dL (ref 0.3–1.2)

## 2011-08-15 LAB — T-HELPER CELL (CD4) - (RCID CLINIC ONLY)
CD4 % Helper T Cell: 12 % — ABNORMAL LOW (ref 33–55)
CD4 T Cell Abs: 140 uL — ABNORMAL LOW (ref 400–2700)

## 2011-08-18 ENCOUNTER — Telehealth: Payer: Self-pay | Admitting: *Deleted

## 2011-08-18 NOTE — Telephone Encounter (Signed)
Per Dr. Luciana Axe patient has developed resistance to her meds.  If she is taking them she needs to discontinue them and be made a sooner appointment than 08/28/11.  Had to leave a voicemail for her to return the call.

## 2011-08-18 NOTE — Telephone Encounter (Signed)
Message copied by Macy Mis on Mon Aug 18, 2011  4:57 PM ------      Message from: Gardiner Barefoot      Created: Mon Aug 18, 2011  1:20 PM       Could you ask her or bridge counselor if she is taking her meds.  If so, have her stop, they are not working (labs look like she is not taking them at all)

## 2011-08-19 ENCOUNTER — Telehealth: Payer: Self-pay | Admitting: *Deleted

## 2011-08-19 ENCOUNTER — Encounter: Payer: Self-pay | Admitting: *Deleted

## 2011-08-19 NOTE — Progress Notes (Signed)
BC met with patient today in her home. Patient has stopped all medications until she sees Dr. Luciana Axe. Patient has Medicaid has recently regained transportation and is in a place where she is ready "to get back on track." Patient's only complaint was not having the money to obtain her medications each month. BC referred patient to [facility] pharmacy who will assist patient with copays and will also deliver her medications to her home. BC spoke with Alesia Morin, CMA and to have patient's pharmacy changed. Patient declined the need for [facility] services and will reconnect with [facility].

## 2011-08-19 NOTE — Telephone Encounter (Signed)
Changed patient pharmacy.

## 2011-08-26 ENCOUNTER — Other Ambulatory Visit: Payer: Self-pay | Admitting: *Deleted

## 2011-08-26 DIAGNOSIS — B20 Human immunodeficiency virus [HIV] disease: Secondary | ICD-10-CM

## 2011-08-26 MED ORDER — ENSURE PO LIQD: 237.0000 mL | Freq: Two times a day (BID) | ORAL | Status: DC

## 2011-08-28 ENCOUNTER — Ambulatory Visit (INDEPENDENT_AMBULATORY_CARE_PROVIDER_SITE_OTHER): Payer: Medicaid Other | Admitting: Internal Medicine

## 2011-08-28 ENCOUNTER — Encounter: Payer: Self-pay | Admitting: Internal Medicine

## 2011-08-28 VITALS — BP 143/89 | HR 77 | Temp 98.2°F | Wt 142.0 lb

## 2011-08-28 DIAGNOSIS — B20 Human immunodeficiency virus [HIV] disease: Secondary | ICD-10-CM

## 2011-08-28 MED ORDER — NICOTINE 21 MG/24HR TD PT24: 1.0000 | MEDICATED_PATCH | TRANSDERMAL | Status: AC

## 2011-08-28 MED ORDER — TRAZODONE HCL 50 MG PO TABS: 50.0000 mg | ORAL_TABLET | Freq: Every day | ORAL | Status: DC

## 2011-08-28 MED ORDER — DARUNAVIR ETHANOLATE 800 MG PO TABS: 800.0000 mg | ORAL_TABLET | Freq: Every day | ORAL | Status: DC

## 2011-08-28 MED ORDER — RALTEGRAVIR POTASSIUM 400 MG PO TABS: 400.0000 mg | ORAL_TABLET | Freq: Two times a day (BID) | ORAL | Status: DC

## 2011-08-28 MED ORDER — RITONAVIR 100 MG PO TABS: 100.0000 mg | ORAL_TABLET | Freq: Every day | ORAL | Status: DC

## 2011-08-28 MED ORDER — SULFAMETHOXAZOLE-TMP DS 800-160 MG PO TABS: 1.0000 | ORAL_TABLET | Freq: Every day | ORAL | Status: DC

## 2011-08-28 MED ORDER — EMTRICITABINE-TENOFOVIR DF 200-300 MG PO TABS: 1.0000 | ORAL_TABLET | Freq: Every day | ORAL | Status: DC

## 2011-08-28 NOTE — Assessment & Plan Note (Addendum)
She will restart her same medications will check her labs in one month. She will then return to clinic 2 weeks later. I did emphasize the need for compliance. The patient voiced her understanding and tells me she is motivated to restart. She also is motivated to quit smoking and I did prescribe her nicotine patch. Next  She also will start Bactrim prophylaxis daily since her CD4 is less than 200

## 2011-08-28 NOTE — Progress Notes (Signed)
Addended by: Gardiner Barefoot on: 08/28/2011 11:11 AM   Modules accepted: Orders

## 2011-08-28 NOTE — Progress Notes (Signed)
  Subjective:    Patient ID: Courtney Ellis, female    DOB: [birth year], 52 y.o.   MRN: [id number]  HPI She comes in for followup of her HIV. She was last seen in January of this year and had been off of medications and was supposed to restart her salvage regimen, however she tells me she has been out of medicines for 2 weeks. Her labs from 2 weeks ago show that she is likely been out of medicines much longer than that. She's had no recent hospitalizations. She otherwise has no specific complaints other than a chronic cough.   Review of Systems  Constitutional: Negative for fever, chills, appetite change, fatigue and unexpected weight change.  HENT: Negative for sore throat and trouble swallowing.   Respiratory: Positive for cough. Negative for shortness of breath and wheezing.   Cardiovascular: Negative for chest pain, palpitations and leg swelling.  Gastrointestinal: Negative for nausea, vomiting, abdominal pain and diarrhea.  Musculoskeletal: Negative for myalgias, joint swelling and arthralgias.  Skin: Negative for rash.  Neurological: Negative for dizziness, weakness and headaches.  Psychiatric/Behavioral: Positive for sleep disturbance. Negative for dysphoric mood. The patient is not nervous/anxious.        Objective:   Physical Exam  Constitutional: She appears well-developed and well-nourished. No distress.       Patient appears sleepy which she attributes to not sleeping at night  Cardiovascular: Normal rate, regular rhythm and normal heart sounds.  Exam reveals no gallop and no friction rub.   No murmur heard. Pulmonary/Chest: Effort normal and breath sounds normal. No respiratory distress. She has no wheezes. She has no rales.  Abdominal: Soft. Bowel sounds are normal. She exhibits no distension. There is no tenderness. There is no rebound.  Lymphadenopathy:    She has no cervical adenopathy.          Assessment & Plan:

## 2011-09-12 ENCOUNTER — Telehealth: Payer: Self-pay | Admitting: *Deleted

## 2011-09-12 NOTE — Telephone Encounter (Signed)
Message left.  Requested pt call [facility] to make appt for PAP smear.

## 2011-09-30 ENCOUNTER — Other Ambulatory Visit: Payer: Medicaid Other

## 2011-10-14 ENCOUNTER — Ambulatory Visit: Payer: Medicaid Other | Admitting: Internal Medicine

## 2011-10-14 ENCOUNTER — Telehealth: Payer: Self-pay | Admitting: *Deleted

## 2011-10-14 NOTE — Telephone Encounter (Signed)
Patient referred to [facility] due to multiple no shows and cancellations.  Unable to reach by phone. Courtney Ellis

## 2011-10-16 ENCOUNTER — Other Ambulatory Visit: Payer: Medicaid Other

## 2011-10-16 DIAGNOSIS — B20 Human immunodeficiency virus [HIV] disease: Secondary | ICD-10-CM

## 2011-10-16 LAB — CBC WITH DIFFERENTIAL/PLATELET
Basophils Absolute: 0 10*3/uL (ref 0.0–0.1)
Basophils Relative: 0 % (ref 0–1)
MCHC: 33.2 g/dL (ref 30.0–36.0)
Monocytes Absolute: 0.4 10*3/uL (ref 0.1–1.0)
Neutro Abs: 3.7 10*3/uL (ref 1.7–7.7)
Neutrophils Relative %: 59 % (ref 43–77)
Platelets: 341 10*3/uL (ref 150–400)
RDW: 17.1 % — ABNORMAL HIGH (ref 11.5–15.5)

## 2011-10-16 LAB — COMPREHENSIVE METABOLIC PANEL
ALT: 21 U/L (ref 0–35)
AST: 32 U/L (ref 0–37)
Albumin: 3.9 g/dL (ref 3.5–5.2)
Calcium: 9.9 mg/dL (ref 8.4–10.5)
Chloride: 103 mEq/L (ref 96–112)
Potassium: 4.5 mEq/L (ref 3.5–5.3)
Total Protein: 8.2 g/dL (ref 6.0–8.3)

## 2011-10-17 LAB — HIV-1 RNA QUANT-NO REFLEX-BLD
HIV 1 RNA Quant: 180 copies/mL — ABNORMAL HIGH (ref <20)
HIV-1 RNA Quant, Log: 2.26 {Log} — ABNORMAL HIGH (ref <1.30)

## 2011-10-29 ENCOUNTER — Ambulatory Visit (INDEPENDENT_AMBULATORY_CARE_PROVIDER_SITE_OTHER): Payer: Medicaid Other | Admitting: Internal Medicine

## 2011-10-29 ENCOUNTER — Encounter: Payer: Self-pay | Admitting: Internal Medicine

## 2011-10-29 ENCOUNTER — Other Ambulatory Visit: Payer: Self-pay | Admitting: *Deleted

## 2011-10-29 VITALS — BP 125/83 | HR 76 | Temp 98.3°F | Ht 63.0 in | Wt 142.0 lb

## 2011-10-29 DIAGNOSIS — G43909 Migraine, unspecified, not intractable, without status migrainosus: Secondary | ICD-10-CM

## 2011-10-29 DIAGNOSIS — B20 Human immunodeficiency virus [HIV] disease: Secondary | ICD-10-CM

## 2011-10-29 DIAGNOSIS — B1920 Unspecified viral hepatitis C without hepatic coma: Secondary | ICD-10-CM

## 2011-10-29 DIAGNOSIS — B1710 Acute hepatitis C without hepatic coma: Secondary | ICD-10-CM

## 2011-10-29 DIAGNOSIS — G44209 Tension-type headache, unspecified, not intractable: Secondary | ICD-10-CM

## 2011-10-29 MED ORDER — BUTALBITAL-ASPIRIN-CAFFEINE 50-325-40 MG PO TABS: 1.0000 | ORAL_TABLET | ORAL | Status: DC | PRN

## 2011-10-29 NOTE — Progress Notes (Signed)
  Subjective:    Patient ID: Courtney Ellis, female    DOB: 01/25/1961, 51 y.o.   MRN: [id number]  HPI Here to follow up after starting a salvage regimen.  Long history on poor compliance.  Started on DRV/r, Isentress, Truvada.  Reports great adherence.  Labs confirm with a viral load down to 180 and increased CD4.  Good tolerance.  Trying to quit smoking and exercising.    Review of Systems  Constitutional: Negative for fever, appetite change, fatigue and unexpected weight change.  HENT: Negative for sore throat and trouble swallowing.   Respiratory: Negative for cough, shortness of breath and wheezing.   Cardiovascular: Negative for chest pain, palpitations and leg swelling.  Gastrointestinal: Negative for nausea, vomiting, abdominal pain and diarrhea.  Musculoskeletal: Negative for myalgias, joint swelling and arthralgias.  Skin: Negative for rash.  Neurological: Negative for dizziness, weakness and headaches.  Hematological: Negative for adenopathy.  Psychiatric/Behavioral: Negative for dysphoric mood. The patient is not nervous/anxious.        Objective:   Physical Exam  Constitutional: She appears well-developed and well-nourished. No distress.  HENT:  Mouth/Throat: Oropharynx is clear and moist. No oropharyngeal exudate.  Cardiovascular: Normal rate, regular rhythm and normal heart sounds.  Exam reveals no gallop and no friction rub.   No murmur heard. Pulmonary/Chest: Effort normal and breath sounds normal. No respiratory distress. She has no wheezes. She has no rales.  Lymphadenopathy:    She has no cervical adenopathy.          Assessment & Plan:

## 2011-10-29 NOTE — Assessment & Plan Note (Signed)
Will check RNA next visit.

## 2011-10-29 NOTE — Assessment & Plan Note (Signed)
Refilled medication

## 2011-10-29 NOTE — Assessment & Plan Note (Addendum)
doint great.  Will keep a close eye on viral load and CD4.  To continue Bactrim prophylaxis for now. Offered condoms. RTC 2 months. Needs a PCP

## 2011-12-16 ENCOUNTER — Other Ambulatory Visit: Payer: Medicaid Other

## 2011-12-16 ENCOUNTER — Telehealth: Payer: Self-pay | Admitting: *Deleted

## 2011-12-16 NOTE — Telephone Encounter (Signed)
Left message to call [facility] for labwork and PAP smear appts.

## 2011-12-16 NOTE — Telephone Encounter (Signed)
Called patient and left voice mail to reschedule her lab appt, she no showed today.  Advised if she did not come in this week her appt with Dr. Luciana Axe for 12/30/11 will have to be rescheduled. Wendall Mola CMA

## 2011-12-30 ENCOUNTER — Ambulatory Visit: Payer: Medicaid Other | Admitting: Internal Medicine

## 2011-12-30 ENCOUNTER — Telehealth: Payer: Self-pay | Admitting: *Deleted
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
## 2012-02-05 ENCOUNTER — Other Ambulatory Visit: Payer: Self-pay | Admitting: Licensed Clinical Social Worker

## 2012-02-10 ENCOUNTER — Other Ambulatory Visit: Payer: Medicaid Other

## 2012-02-17 ENCOUNTER — Other Ambulatory Visit: Payer: Medicaid Other

## 2012-02-17 DIAGNOSIS — B1920 Unspecified viral hepatitis C without hepatic coma: Secondary | ICD-10-CM

## 2012-02-17 DIAGNOSIS — B20 Human immunodeficiency virus [HIV] disease: Secondary | ICD-10-CM

## 2012-02-17 LAB — COMPLETE METABOLIC PANEL WITH GFR
Alkaline Phosphatase: 72 U/L (ref 39–117)
Creat: 0.85 mg/dL (ref 0.50–1.10)
GFR, Est Non African American: 81 mL/min
Glucose, Bld: 103 mg/dL — ABNORMAL HIGH (ref 70–99)
Sodium: 135 mEq/L (ref 135–145)
Total Bilirubin: 0.5 mg/dL (ref 0.3–1.2)
Total Protein: 7.5 g/dL (ref 6.0–8.3)

## 2012-02-17 LAB — CBC WITH DIFFERENTIAL/PLATELET
Basophils Relative: 0 % (ref 0–1)
Eosinophils Absolute: 0.1 10*3/uL (ref 0.0–0.7)
Eosinophils Relative: 1 % (ref 0–5)
Hemoglobin: 12.2 g/dL (ref 12.0–15.0)
MCH: 29.7 pg (ref 26.0–34.0)
MCHC: 34.5 g/dL (ref 30.0–36.0)
Monocytes Relative: 10 % (ref 3–12)
Neutrophils Relative %: 67 % (ref 43–77)
Platelets: 313 10*3/uL (ref 150–400)

## 2012-02-18 LAB — HEPATITIS C RNA QUANTITATIVE
HCV Quantitative Log: 7.1 {Log} — ABNORMAL HIGH (ref <1.18)
HCV Quantitative: 12682069 IU/mL — ABNORMAL HIGH (ref <15)

## 2012-02-18 LAB — T-HELPER CELL (CD4) - (RCID CLINIC ONLY)
CD4 % Helper T Cell: 10 % — ABNORMAL LOW (ref 33–55)
CD4 T Cell Abs: 120 uL — ABNORMAL LOW (ref 400–2700)

## 2012-02-24 ENCOUNTER — Telehealth: Payer: Self-pay | Admitting: *Deleted

## 2012-02-24 ENCOUNTER — Ambulatory Visit: Payer: Medicaid Other | Admitting: Internal Medicine

## 2012-02-24 NOTE — Telephone Encounter (Signed)
Called and left message with patient's partner to call the clinic to reschedule appt, she no showed today. Wendall Mola

## 2012-03-11 ENCOUNTER — Encounter: Payer: Self-pay | Admitting: Internal Medicine

## 2012-03-11 ENCOUNTER — Ambulatory Visit (INDEPENDENT_AMBULATORY_CARE_PROVIDER_SITE_OTHER): Payer: Medicaid Other | Admitting: Internal Medicine

## 2012-03-11 ENCOUNTER — Other Ambulatory Visit (HOSPITAL_COMMUNITY)
Admission: RE | Admit: 2012-03-11 | Discharge: 2012-03-11 | Disposition: A | Discharge status: Home / Self Care | Payer: Medicaid Other | Source: Ambulatory Visit | Attending: Internal Medicine | Admitting: Internal Medicine

## 2012-03-11 VITALS — BP 152/85 | HR 82 | Temp 98.1°F | Ht 63.0 in | Wt 154.0 lb

## 2012-03-11 DIAGNOSIS — G43909 Migraine, unspecified, not intractable, without status migrainosus: Secondary | ICD-10-CM

## 2012-03-11 DIAGNOSIS — B20 Human immunodeficiency virus [HIV] disease: Secondary | ICD-10-CM

## 2012-03-11 DIAGNOSIS — F339 Major depressive disorder, recurrent, unspecified: Secondary | ICD-10-CM

## 2012-03-11 DIAGNOSIS — Z23 Encounter for immunization: Secondary | ICD-10-CM

## 2012-03-11 DIAGNOSIS — G44209 Tension-type headache, unspecified, not intractable: Secondary | ICD-10-CM

## 2012-03-11 DIAGNOSIS — Z113 Encounter for screening for infections with a predominantly sexual mode of transmission: Secondary | ICD-10-CM | POA: Insufficient documentation

## 2012-03-11 DIAGNOSIS — G4700 Insomnia, unspecified: Secondary | ICD-10-CM

## 2012-03-11 DIAGNOSIS — B1710 Acute hepatitis C without hepatic coma: Secondary | ICD-10-CM

## 2012-03-11 MED ORDER — RITONAVIR 100 MG PO TABS: 100.0000 mg | ORAL_TABLET | Freq: Every day | ORAL | Status: DC

## 2012-03-11 MED ORDER — DOLUTEGRAVIR SODIUM 50 MG PO TABS: 50.0000 mg | ORAL_TABLET | Freq: Every day | ORAL | Status: DC

## 2012-03-11 MED ORDER — DIAZEPAM 2 MG PO TABS: 2.0000 mg | ORAL_TABLET | Freq: Every evening | ORAL | Status: DC | PRN

## 2012-03-11 MED ORDER — SULFAMETHOXAZOLE-TMP DS 800-160 MG PO TABS: 1.0000 | ORAL_TABLET | Freq: Every day | ORAL | Status: DC

## 2012-03-11 MED ORDER — EMTRICITABINE-TENOFOVIR DF 200-300 MG PO TABS: 1.0000 | ORAL_TABLET | Freq: Every day | ORAL | Status: DC

## 2012-03-11 MED ORDER — BUTALBITAL-ASPIRIN-CAFFEINE 50-325-40 MG PO TABS: 1.0000 | ORAL_TABLET | ORAL | Status: DC | PRN

## 2012-03-11 MED ORDER — DARUNAVIR ETHANOLATE 800 MG PO TABS: 800.0000 mg | ORAL_TABLET | Freq: Every day | ORAL | Status: DC

## 2012-03-11 NOTE — Assessment & Plan Note (Signed)
She does have active hepatitis C as evidenced by her viral load. Once her HIV is well controlled, she will need referral to hepatology.

## 2012-03-11 NOTE — Assessment & Plan Note (Addendum)
I again talked to her at length the need to restart her medications. She felt that she was better when she was with [facility] and will be rereferred there. She has had difficulty with the co-pays so they hopefully we'll be able to help with that. She will restart and no medications were sent and will be delivered to her house. I will recheck her labs in about 3 weeks and I will see her one week following that.  She voiced her understanding of the need to take medication and understands that she will continued to be ill and has a high chance of death without the medications. She understands that she will have a prolonged period of fatigue and other issues as she improves.  More than 45 minutes was spent with face to face counseling and coordination of care.

## 2012-03-11 NOTE — Assessment & Plan Note (Signed)
She does continue to struggle with depression, particularly when her viruses out of control. She is hopeful that with taking her medications she will be in better control and feels that she did well with [facility] who she had as a resource to talk to.

## 2012-03-11 NOTE — Assessment & Plan Note (Signed)
I think most of her headache is tension. I will refill her headache medication for

## 2012-03-11 NOTE — Assessment & Plan Note (Signed)
She tells me she has had some benefit with Valium for sleep and therefore I will prescribe her a small dose.

## 2012-03-11 NOTE — Progress Notes (Signed)
  Subjective:    Patient ID: Courtney Ellis, female    DOB: 1961/03/22, 51 y.o.   MRN: [id number]  HPI She comes in for followup of her HIV. She has a long history of poor compliance fell had been doing well on her regimen. She was started on Prezista, Norvir, Truvada and Isentress.  She was doing well with a decrease in her viral load but has been off medicines for some time now. She is having difficulty with the $3 co-pay. She also is having a lot of difficulty with sleep, depressed mood and feels she is not organized well to get her medications. She denies any suicidal ideation.   Review of Systems  Constitutional: Positive for activity change and fatigue. Negative for fever, chills and unexpected weight change.  HENT: Negative for sore throat and trouble swallowing.   Respiratory: Negative for cough and shortness of breath.   Cardiovascular: Negative for palpitations and leg swelling.  Gastrointestinal: Negative for nausea, abdominal pain and diarrhea.  Genitourinary: Positive for dysuria and hematuria.  Musculoskeletal: Negative for myalgias, joint swelling and arthralgias.  Neurological: Positive for headaches. Negative for dizziness and light-headedness.       Objective:   Physical Exam  Constitutional: She appears well-developed.       Sleepy appearing but does answer appropriately  Cardiovascular: Normal rate, regular rhythm and normal heart sounds.  Exam reveals no gallop and no friction rub.   No murmur heard. Pulmonary/Chest: Effort normal and breath sounds normal. No respiratory distress. She has no wheezes. She has no rales.          Assessment & Plan:

## 2012-03-12 LAB — URINALYSIS, MICROSCOPIC ONLY: Crystals: NONE SEEN

## 2012-03-12 LAB — URINALYSIS, ROUTINE W REFLEX MICROSCOPIC
Bilirubin Urine: NEGATIVE
Leukocytes, UA: NEGATIVE
Nitrite: NEGATIVE
Protein, ur: 30 mg/dL — AB
Specific Gravity, Urine: 1.019 (ref 1.005–1.030)
Urobilinogen, UA: 1 mg/dL (ref 0.0–1.0)

## 2012-03-24 ENCOUNTER — Encounter: Payer: Self-pay | Admitting: *Deleted

## 2012-04-01 ENCOUNTER — Other Ambulatory Visit: Payer: Medicaid Other

## 2012-04-08 ENCOUNTER — Ambulatory Visit: Payer: Medicaid Other | Admitting: Internal Medicine

## 2012-05-02 ENCOUNTER — Encounter (HOSPITAL_COMMUNITY): Payer: Self-pay | Admitting: *Deleted

## 2012-05-02 ENCOUNTER — Emergency Department (HOSPITAL_COMMUNITY)
Admission: EM | Admit: 2012-05-02 | Discharge: 2012-05-02 | Disposition: A | Discharge status: Home / Self Care | Payer: Medicaid Other | Attending: Emergency Medicine | Admitting: Emergency Medicine

## 2012-05-02 DIAGNOSIS — R05 Cough: Secondary | ICD-10-CM | POA: Insufficient documentation

## 2012-05-02 DIAGNOSIS — F17200 Nicotine dependence, unspecified, uncomplicated: Secondary | ICD-10-CM | POA: Insufficient documentation

## 2012-05-02 DIAGNOSIS — R630 Anorexia: Secondary | ICD-10-CM | POA: Insufficient documentation

## 2012-05-02 DIAGNOSIS — Z79899 Other long term (current) drug therapy: Secondary | ICD-10-CM | POA: Insufficient documentation

## 2012-05-02 DIAGNOSIS — R51 Headache: Secondary | ICD-10-CM | POA: Insufficient documentation

## 2012-05-02 DIAGNOSIS — M542 Cervicalgia: Secondary | ICD-10-CM | POA: Insufficient documentation

## 2012-05-02 DIAGNOSIS — B20 Human immunodeficiency virus [HIV] disease: Secondary | ICD-10-CM | POA: Insufficient documentation

## 2012-05-02 DIAGNOSIS — H579 Unspecified disorder of eye and adnexa: Secondary | ICD-10-CM | POA: Insufficient documentation

## 2012-05-02 DIAGNOSIS — R5381 Other malaise: Secondary | ICD-10-CM | POA: Insufficient documentation

## 2012-05-02 DIAGNOSIS — R509 Fever, unspecified: Secondary | ICD-10-CM | POA: Insufficient documentation

## 2012-05-02 DIAGNOSIS — IMO0001 Reserved for inherently not codable concepts without codable children: Secondary | ICD-10-CM | POA: Insufficient documentation

## 2012-05-02 DIAGNOSIS — J029 Acute pharyngitis, unspecified: Secondary | ICD-10-CM | POA: Insufficient documentation

## 2012-05-02 DIAGNOSIS — F3289 Other specified depressive episodes: Secondary | ICD-10-CM | POA: Insufficient documentation

## 2012-05-02 DIAGNOSIS — H109 Unspecified conjunctivitis: Secondary | ICD-10-CM | POA: Insufficient documentation

## 2012-05-02 DIAGNOSIS — I10 Essential (primary) hypertension: Secondary | ICD-10-CM | POA: Insufficient documentation

## 2012-05-02 DIAGNOSIS — J3489 Other specified disorders of nose and nasal sinuses: Secondary | ICD-10-CM | POA: Insufficient documentation

## 2012-05-02 DIAGNOSIS — R059 Cough, unspecified: Secondary | ICD-10-CM | POA: Insufficient documentation

## 2012-05-02 DIAGNOSIS — F329 Major depressive disorder, single episode, unspecified: Secondary | ICD-10-CM | POA: Insufficient documentation

## 2012-05-02 LAB — BASIC METABOLIC PANEL
GFR calc Af Amer: >90 mL/min (ref >90)
GFR calc non Af Amer: 79 mL/min — ABNORMAL LOW (ref >90)
Glucose, Bld: 107 mg/dL — ABNORMAL HIGH (ref 70–99)
Potassium: 4.2 mEq/L (ref 3.5–5.1)
Sodium: 131 mEq/L — ABNORMAL LOW (ref 135–145)

## 2012-05-02 LAB — CBC WITH DIFFERENTIAL/PLATELET
Eosinophils Absolute: 0 10*3/uL (ref 0.0–0.7)
Lymphocytes Relative: 26 % (ref 12–46)
Lymphs Abs: 2.4 10*3/uL (ref 0.7–4.0)
MCH: 27.9 pg (ref 26.0–34.0)
Neutrophils Relative %: 63 % (ref 43–77)
Platelets: 267 10*3/uL (ref 150–400)
RBC: 4.3 MIL/uL (ref 3.87–5.11)
WBC: 9 10*3/uL (ref 4.0–10.5)

## 2012-05-02 LAB — RAPID STREP SCREEN (MED CTR MEBANE ONLY): Streptococcus, Group A Screen (Direct): NEGATIVE

## 2012-05-02 MED ORDER — TOBRAMYCIN 0.3 % OP SOLN: 1.0000 [drp] | Freq: Four times a day (QID) | OPHTHALMIC | Status: DC

## 2012-05-02 MED ORDER — ACETAMINOPHEN 325 MG PO TABS
650.0000 mg | ORAL_TABLET | Freq: Once | ORAL | Status: AC
  Administered 2012-05-02: 650 mg via ORAL
  Filled 2012-05-02: qty 2

## 2012-05-02 MED ORDER — OSELTAMIVIR PHOSPHATE 75 MG PO CAPS: 75.0000 mg | ORAL_CAPSULE | Freq: Two times a day (BID) | ORAL | Status: DC

## 2012-05-02 NOTE — ED Provider Notes (Signed)
History     CSN: [id number]  Arrival date & time 05/02/12  1805   First MD Initiated Contact with Patient 05/02/12 1903      Chief Complaint  Patient presents with  . Conjunctivitis  . Sore Throat  . Weakness  . Depression    (Consider location/radiation/quality/duration/timing/severity/associated sxs/prior treatment) HPI Comments: Pt presents to the ED with complaints of flu-like symptoms of dry cough, congestion, sore throat, muscle aches, chills, fever, headache, fatigue, and decreased appetite. The patient states that the symptoms started yesterday.  Pt has been around other sick contacts and did not get the flu shot this year. The patient denies neck pain/stiffness, focal weakness, vision changes, severe abdominal pain, inability to eat or drink, difficulty breathing, SOB, wheezing, or chest pain. The patient has not taken anything for her symptoms prior to arrival.   Patient is a 52 y.o. female presenting with conjunctivitis. The history is provided by the patient.  Conjunctivitis  The current episode started yesterday. The onset was sudden. The problem occurs rarely. The problem has been gradually worsening. The problem is severe. Nothing relieves the symptoms. Nothing aggravates the symptoms. Associated symptoms include eye itching, rhinorrhea, sore throat, swollen glands, muscle aches, neck pain, cough, eye discharge and eye redness. Pertinent negatives include no orthopnea, no fever, no decreased vision, no double vision, no photophobia, no abdominal pain, no constipation, no diarrhea, no nausea, no vomiting, no congestion, no ear discharge, no ear pain, no headaches, no hearing loss, no mouth sores, no stridor, no neck stiffness, no URI, no wheezing, no rash and no eye pain. Associated symptoms comments: Pt has complaint of conjunctivitis with onset of last night and worsening on awakening this morning.  It is accompanied by a sore throat, mild cough, runny nose, body aches.. The  eye pain is mild. The left eye is affected.The eye pain is not associated with movement. The eyelid exhibits swelling. It is unknown what precipitates the cough. Cough worsened by: Pt thinks that her cough could be coming from her smoking. There is nasal congestion. The congestion does not interfere with sleep. The congestion does not interfere with eating or drinking. The nasal discharge has a clear appearance. She has been experiencing a severe sore throat. Neither side is more painful than the other. The sore throat is characterized by pain only. The neck pain is moderate. There is no swelling present in the neck. The quality of the neck pain is unknown. There is anterior neck pain. She has been behaving normally.    Past Medical History  Diagnosis Date  . Hypertension   . HIV infection     History reviewed. No pertinent past surgical history.  Family History  Problem Relation Age of Onset  . Hypertension Mother     History  Substance Use Topics  . Smoking status: Current Every Day Smoker -- 0.3 packs/day    Types: Cigarettes  . Smokeless tobacco: Never Used  . Alcohol Use: No    OB History    Grav Para Term Preterm Abortions TAB SAB Ect Mult Living                  Review of Systems  Constitutional: Negative for fever, diaphoresis, activity change and appetite change.       Pt states that she has felt tired and weak in the last few weeks.  HENT: Positive for sore throat, rhinorrhea and neck pain. Negative for hearing loss, ear pain, congestion, facial swelling, sneezing, drooling, mouth sores,  trouble swallowing, neck stiffness, voice change, postnasal drip and ear discharge.        Pt states that she has pain in the submandibular region of her neck on palpation.  She also complains of a sore throat with a mildly runny nose.  Eyes: Positive for discharge, redness and itching. Negative for double vision, photophobia, pain and visual disturbance.       Her right eye is red and  watery and the patient states that is itchy and has had a yellow discharge coming from it since this morning.  When she woke up she said it was matted shut.  Respiratory: Positive for cough. Negative for apnea, choking, chest tightness, shortness of breath, wheezing and stridor.   Cardiovascular: Negative for chest pain, palpitations, orthopnea and leg swelling.  Gastrointestinal: Negative for nausea, vomiting, abdominal pain, diarrhea, constipation and blood in stool.  Genitourinary: Negative for dysuria, urgency, frequency, hematuria, decreased urine volume, vaginal bleeding, difficulty urinating, vaginal pain, menstrual problem and pelvic pain.  Musculoskeletal: Positive for myalgias and arthralgias. Negative for back pain and joint swelling.       Pt complains of body aches.  Skin: Negative for rash and wound.  Neurological: Negative for dizziness, syncope, facial asymmetry, speech difficulty, light-headedness and headaches.  Psychiatric/Behavioral: Positive for dysphoric mood. Negative for suicidal ideas, hallucinations, behavioral problems, confusion, sleep disturbance, decreased concentration and agitation. The patient is not nervous/anxious and is not hyperactive.        Pt complains of feeling down and "not knowing what to do:"  All other systems reviewed and are negative.    Allergies  Review of patient's allergies indicates no known allergies.  Home Medications   Current Outpatient Rx  Name  Route  Sig  Dispense  Refill  . BUTALBITAL-ASPIRIN-CAFFEINE 50-325-40 MG PO TABS   Oral   Take 1 tablet by mouth every 4 (four) hours as needed.   14 tablet   5   . DARUNAVIR ETHANOLATE 800 MG PO TABS   Oral   Take 1 tablet (800 mg total) by mouth daily.   30 tablet   11   . DIAZEPAM 2 MG PO TABS   Oral   Take 1 tablet (2 mg total) by mouth at bedtime as needed for anxiety.   30 tablet   5   . DOLUTEGRAVIR SODIUM 50 MG PO TABS   Oral   Take 50 mg by mouth daily.   30 tablet    5   . EMTRICITABINE-TENOFOVIR 200-300 MG PO TABS   Oral   Take 1 tablet by mouth daily.   30 tablet   11   . RITONAVIR 100 MG PO TABS   Oral   Take 1 tablet (100 mg total) by mouth daily with breakfast.   30 tablet   11   . SULFAMETHOXAZOLE-TMP DS 800-160 MG PO TABS   Oral   Take 1 tablet by mouth daily.   30 tablet   11   . TRAZODONE HCL 50 MG PO TABS   Oral   Take 1 tablet (50 mg total) by mouth at bedtime. For sleep   30 tablet   5     BP 134/76  Pulse 96  Temp 101.8 F (38.8 C) (Oral)  Resp 18  SpO2 97%  Physical Exam  Nursing note and vitals reviewed. Constitutional: She appears well-developed and well-nourished. No distress.  HENT:  Head: Normocephalic and atraumatic. No trismus in the jaw.  Right Ear: Tympanic membrane and ear  canal normal.  Left Ear: Tympanic membrane and ear canal normal.  Nose: Nose normal.  Mouth/Throat: Uvula is midline and mucous membranes are normal. No uvula swelling. Oropharyngeal exudate, posterior oropharyngeal edema and posterior oropharyngeal erythema present. No tonsillar abscesses.  Eyes: EOM and lids are normal. Pupils are equal, round, and reactive to light. No foreign bodies found. Right conjunctiva is not injected. Left conjunctiva is injected. No scleral icterus.       No pain with EOM No periorbital edema or erythema  Neck: Normal range of motion. Neck supple.  Cardiovascular: Normal rate, regular rhythm and normal heart sounds.   Pulmonary/Chest: Effort normal and breath sounds normal. No respiratory distress. She has no wheezes. She has no rales. She exhibits no tenderness.  Abdominal: Soft. Bowel sounds are normal. She exhibits no distension and no mass. There is no tenderness. There is no rebound and no guarding.  Musculoskeletal: Normal range of motion.  Lymphadenopathy:    She has cervical adenopathy.  Neurological: She is alert.  Skin: Skin is warm and dry. No rash noted. She is not diaphoretic.  Psychiatric:  She has a normal mood and affect.    ED Course  Procedures (including critical care time)  Labs Reviewed - No data to display No results found.   No diagnosis found.    MDM  Patient with a history of AIDS presents today with flu like symptoms.  Patient is nontoxic appearing.  Labs are unremarkable.  Patient is not hypoxic.  Patient able to tolerate PO liquids.  Therefore, feel that patient can be discharged home.  Patient given Rx for Tamiflu.  Return precautions discussed.        Pascal Lux Elida, PA-C 05/03/12 1740

## 2012-05-02 NOTE — ED Notes (Signed)
Pt awoke with redness, irritation and drainage in left eye.  Pt has had sore throat X 2 days and reports feeling depressed.  Please see pt's health hx.

## 2012-05-04 NOTE — ED Provider Notes (Signed)
Medical screening examination/treatment/procedure(s) were performed by non-physician practitioner and as supervising physician I was immediately available for consultation/collaboration.   Rhapsody Wolven E Minetta Krisher, MD 05/04/12 0824 

## 2012-05-20 ENCOUNTER — Other Ambulatory Visit (INDEPENDENT_AMBULATORY_CARE_PROVIDER_SITE_OTHER): Payer: Medicaid Other

## 2012-05-20 DIAGNOSIS — B20 Human immunodeficiency virus [HIV] disease: Secondary | ICD-10-CM

## 2012-05-20 LAB — COMPLETE METABOLIC PANEL WITH GFR
ALT: 31 U/L (ref 0–35)
AST: 40 U/L — ABNORMAL HIGH (ref 0–37)
Alkaline Phosphatase: 71 U/L (ref 39–117)
Chloride: 104 mEq/L (ref 96–112)
Creat: 0.91 mg/dL (ref 0.50–1.10)
Total Bilirubin: 0.5 mg/dL (ref 0.3–1.2)

## 2012-05-20 LAB — CBC WITH DIFFERENTIAL/PLATELET
Eosinophils Absolute: 0.1 10*3/uL (ref 0.0–0.7)
Eosinophils Relative: 2 % (ref 0–5)
HCT: 38.5 % (ref 36.0–46.0)
Hemoglobin: 12.7 g/dL (ref 12.0–15.0)
Lymphocytes Relative: 38 % (ref 12–46)
Lymphs Abs: 2.1 10*3/uL (ref 0.7–4.0)
MCH: 27.9 pg (ref 26.0–34.0)
MCV: 84.6 fL (ref 78.0–100.0)
Monocytes Relative: 8 % (ref 3–12)
RBC: 4.55 MIL/uL (ref 3.87–5.11)

## 2012-05-21 LAB — HIV-1 RNA ULTRAQUANT REFLEX TO GENTYP+
HIV 1 RNA Quant: 214 copies/mL — ABNORMAL HIGH (ref <20)
HIV-1 RNA Quant, Log: 2.33 {Log} — ABNORMAL HIGH (ref <1.30)

## 2012-06-01 ENCOUNTER — Ambulatory Visit (INDEPENDENT_AMBULATORY_CARE_PROVIDER_SITE_OTHER): Payer: Medicaid Other | Admitting: Internal Medicine

## 2012-06-01 ENCOUNTER — Encounter: Payer: Self-pay | Admitting: Internal Medicine

## 2012-06-01 VITALS — BP 166/107 | HR 80 | Temp 98.1°F | Ht 63.0 in | Wt 146.0 lb

## 2012-06-01 DIAGNOSIS — R030 Elevated blood-pressure reading, without diagnosis of hypertension: Secondary | ICD-10-CM

## 2012-06-01 DIAGNOSIS — B20 Human immunodeficiency virus [HIV] disease: Secondary | ICD-10-CM

## 2012-06-01 NOTE — Assessment & Plan Note (Signed)
She is now doing much better and reports excellent compliance though does endorse occasional missed doses. I did encourage her to work on not missing any days and she is going to continued doing well. She will call if she  Begins to Miss. More than a dose every 2 weeks otherwise she will followup for labs in 3 months.

## 2012-06-01 NOTE — Progress Notes (Signed)
  Subjective:    Patient ID: Courtney Ellis, female    DOB: 10-03-1960, 52 y.o.   MRN: [id number]  HPI She comes in for followup of her HIV. He has a long history of poor compliance and most recently was on darunavir, norvir, truvada and Isentress.  She though had been off for many months and at her last visit, I started her on dolutegravir with daily to Darunavir, Norvir and Truvada. She reports excellent compliance and in fact her viral load has decreased below 500 and her CD4 count is now over 200. She overall feels better though does have some fatigue. She also has elevated blood pressure today. She has not had any significant weight loss. No headache or dizziness.   Review of Systems  Constitutional: Positive for fatigue. Negative for fever, appetite change and unexpected weight change.  HENT: Negative for sore throat and trouble swallowing.   Respiratory: Negative for shortness of breath.   Cardiovascular: Negative for chest pain, palpitations and leg swelling.  Gastrointestinal: Negative for nausea, abdominal pain and diarrhea.  Skin: Negative for rash.  Neurological: Negative for dizziness, light-headedness and headaches.  Psychiatric/Behavioral: The patient is not nervous/anxious.        Objective:   Physical Exam  Constitutional: She appears well-developed and well-nourished. No distress.  Cardiovascular: Normal rate, regular rhythm and normal heart sounds.  Exam reveals no gallop and no friction rub.   No murmur heard. Pulmonary/Chest: Effort normal and breath sounds normal. No respiratory distress. She has no wheezes. She has no rales.  Lymphadenopathy:    She has no cervical adenopathy.          Assessment & Plan:

## 2012-06-01 NOTE — Progress Notes (Signed)
n

## 2012-06-01 NOTE — Assessment & Plan Note (Signed)
Her blood pressure continues to be elevated. I did discuss with her that smoking cessation would be optimal for blood pressure control. She is going to check her blood pressure at home with a friend's machine and otherwise she will followup in one week in regards to her blood pressure to check again and consider pharmacotherapy if it remains elevated

## 2012-06-02 ENCOUNTER — Encounter (HOSPITAL_COMMUNITY): Payer: Self-pay

## 2012-06-02 ENCOUNTER — Emergency Department (HOSPITAL_COMMUNITY)
Admission: EM | Admit: 2012-06-02 | Discharge: 2012-06-02 | Disposition: A | Discharge status: Home / Self Care | Payer: Medicaid Other | Attending: Emergency Medicine | Admitting: Emergency Medicine

## 2012-06-02 DIAGNOSIS — I10 Essential (primary) hypertension: Secondary | ICD-10-CM | POA: Insufficient documentation

## 2012-06-02 DIAGNOSIS — S139XXA Sprain of joints and ligaments of unspecified parts of neck, initial encounter: Secondary | ICD-10-CM | POA: Insufficient documentation

## 2012-06-02 DIAGNOSIS — S161XXA Strain of muscle, fascia and tendon at neck level, initial encounter: Secondary | ICD-10-CM

## 2012-06-02 DIAGNOSIS — Y92410 Unspecified street and highway as the place of occurrence of the external cause: Secondary | ICD-10-CM | POA: Insufficient documentation

## 2012-06-02 DIAGNOSIS — Y9389 Activity, other specified: Secondary | ICD-10-CM | POA: Insufficient documentation

## 2012-06-02 DIAGNOSIS — Z79899 Other long term (current) drug therapy: Secondary | ICD-10-CM | POA: Insufficient documentation

## 2012-06-02 DIAGNOSIS — F17200 Nicotine dependence, unspecified, uncomplicated: Secondary | ICD-10-CM | POA: Insufficient documentation

## 2012-06-02 DIAGNOSIS — Z21 Asymptomatic human immunodeficiency virus [HIV] infection status: Secondary | ICD-10-CM | POA: Insufficient documentation

## 2012-06-02 MED ORDER — HYDROCODONE-ACETAMINOPHEN 5-325 MG PO TABS
1.0000 | ORAL_TABLET | Freq: Once | ORAL | Status: AC
  Administered 2012-06-02: 1 via ORAL
  Filled 2012-06-02: qty 1

## 2012-06-02 MED ORDER — TRAMADOL HCL 50 MG PO TABS: 50.0000 mg | ORAL_TABLET | Freq: Four times a day (QID) | ORAL | Status: DC | PRN
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
## 2012-06-03 ENCOUNTER — Ambulatory Visit: Payer: Medicaid Other | Admitting: Internal Medicine

## 2012-06-10 ENCOUNTER — Ambulatory Visit (INDEPENDENT_AMBULATORY_CARE_PROVIDER_SITE_OTHER): Payer: Medicaid Other | Admitting: Internal Medicine

## 2012-06-10 ENCOUNTER — Encounter: Payer: Self-pay | Admitting: Internal Medicine

## 2012-06-10 VITALS — BP 117/77 | HR 81 | Temp 98.4°F | Ht 63.0 in | Wt 147.0 lb

## 2012-06-10 DIAGNOSIS — R030 Elevated blood-pressure reading, without diagnosis of hypertension: Secondary | ICD-10-CM

## 2012-06-10 NOTE — Assessment & Plan Note (Signed)
At this time, her blood pressure is in the normal range and therefore no indication to start her on therapy. This will be monitored and I did continue to encourage smoking cessation. She does have information for the [place] quit line. She also will continue exercise and reducing her salt intake.

## 2012-06-10 NOTE — Progress Notes (Signed)
She comes in here for followup of her high blood pressure. She was seen last visit for her routine HIV care in which she continues to take her medicine well but was noted to have significantly elevated blood pressure of 160/107. She was having no symptoms. I did discuss this with her and had her return in one week for blood pressure check with me and I expected to have to start medication. She also is going to check her blood pressure at home with a friend's blood pressure cuff and at the local pharmacy. She however did not check it at home. She continues to have no headache. She has greatly reduced her cigarette smoking and also continues to reduce her salt intake.  PE: AAO x 3, nad CV: RRR without m/r/g

## 2012-08-30 ENCOUNTER — Other Ambulatory Visit: Payer: Medicaid Other

## 2012-09-03 ENCOUNTER — Other Ambulatory Visit: Payer: Self-pay | Admitting: *Deleted

## 2012-09-03 DIAGNOSIS — B20 Human immunodeficiency virus [HIV] disease: Secondary | ICD-10-CM

## 2012-09-03 MED ORDER — DOLUTEGRAVIR SODIUM 50 MG PO TABS: 50.0000 mg | ORAL_TABLET | Freq: Every day | ORAL | Status: DC

## 2012-09-08 NOTE — Progress Notes (Signed)
Patient ID: Courtney Ellis, female   DOB: 09/18/1960, 52 y.o.   MRN: [id number] [facility] CM: Namon Cirri [phone]

## 2012-09-14 ENCOUNTER — Ambulatory Visit: Payer: Medicaid Other | Admitting: Internal Medicine

## 2012-09-14 ENCOUNTER — Telehealth: Payer: Self-pay | Admitting: *Deleted

## 2012-09-14 NOTE — Telephone Encounter (Signed)
Called and left patient a voice mail to come to our walk in clinic any AM or PM. Labs can be done at appointment. Courtney Ellis

## 2012-09-28 ENCOUNTER — Ambulatory Visit: Payer: Medicaid Other | Admitting: Internal Medicine

## 2012-10-22 ENCOUNTER — Emergency Department (HOSPITAL_COMMUNITY)
Admission: EM | Admit: 2012-10-22 | Discharge: 2012-10-22 | Disposition: A | Discharge status: Home / Self Care | Payer: Medicaid Other | Attending: Emergency Medicine | Admitting: Emergency Medicine

## 2012-10-22 ENCOUNTER — Encounter (HOSPITAL_COMMUNITY): Payer: Self-pay

## 2012-10-22 DIAGNOSIS — T398X2A Poisoning by other nonopioid analgesics and antipyretics, not elsewhere classified, intentional self-harm, initial encounter: Secondary | ICD-10-CM | POA: Insufficient documentation

## 2012-10-22 DIAGNOSIS — T394X2A Poisoning by antirheumatics, not elsewhere classified, intentional self-harm, initial encounter: Secondary | ICD-10-CM | POA: Insufficient documentation

## 2012-10-22 DIAGNOSIS — R4182 Altered mental status, unspecified: Secondary | ICD-10-CM | POA: Insufficient documentation

## 2012-10-22 DIAGNOSIS — Z79899 Other long term (current) drug therapy: Secondary | ICD-10-CM | POA: Insufficient documentation

## 2012-10-22 DIAGNOSIS — I10 Essential (primary) hypertension: Secondary | ICD-10-CM | POA: Insufficient documentation

## 2012-10-22 DIAGNOSIS — F17200 Nicotine dependence, unspecified, uncomplicated: Secondary | ICD-10-CM | POA: Insufficient documentation

## 2012-10-22 DIAGNOSIS — Z21 Asymptomatic human immunodeficiency virus [HIV] infection status: Secondary | ICD-10-CM | POA: Insufficient documentation

## 2012-10-22 DIAGNOSIS — T401X4A Poisoning by heroin, undetermined, initial encounter: Secondary | ICD-10-CM | POA: Insufficient documentation

## 2012-10-22 LAB — URINALYSIS, ROUTINE W REFLEX MICROSCOPIC
Glucose, UA: 100 mg/dL — AB
Ketones, ur: NEGATIVE mg/dL
Leukocytes, UA: NEGATIVE
Nitrite: NEGATIVE
Specific Gravity, Urine: 1.019 (ref 1.005–1.030)
pH: 6.5 (ref 5.0–8.0)

## 2012-10-22 LAB — RAPID URINE DRUG SCREEN, HOSP PERFORMED
Benzodiazepines: NOT DETECTED
Cocaine: NOT DETECTED
Opiates: POSITIVE — AB
Tetrahydrocannabinol: POSITIVE — AB

## 2012-10-22 LAB — CBC WITH DIFFERENTIAL/PLATELET
Basophils Absolute: 0 10*3/uL (ref 0.0–0.1)
Eosinophils Absolute: 0.1 10*3/uL (ref 0.0–0.7)
Eosinophils Relative: 1 % (ref 0–5)
Lymphocytes Relative: 13 % (ref 12–46)
Lymphs Abs: 0.9 10*3/uL (ref 0.7–4.0)
MCH: 28.4 pg (ref 26.0–34.0)
Neutrophils Relative %: 82 % — ABNORMAL HIGH (ref 43–77)
Platelets: 215 10*3/uL (ref 150–400)
RBC: 4.43 MIL/uL (ref 3.87–5.11)
RDW: 12.5 % (ref 11.5–15.5)
WBC: 7 10*3/uL (ref 4.0–10.5)

## 2012-10-22 LAB — COMPREHENSIVE METABOLIC PANEL
ALT: 23 U/L (ref 0–35)
AST: 43 U/L — ABNORMAL HIGH (ref 0–37)
Albumin: 3.2 g/dL — ABNORMAL LOW (ref 3.5–5.2)
Alkaline Phosphatase: 94 U/L (ref 39–117)
Calcium: 9.2 mg/dL (ref 8.4–10.5)
GFR calc Af Amer: >90 mL/min (ref >90)
Glucose, Bld: 122 mg/dL — ABNORMAL HIGH (ref 70–99)
Potassium: 3.9 mEq/L (ref 3.5–5.1)
Sodium: 137 mEq/L (ref 135–145)
Total Protein: 7.9 g/dL (ref 6.0–8.3)

## 2012-10-22 LAB — CARBOXYHEMOGLOBIN
Carboxyhemoglobin: 3.8 % — ABNORMAL HIGH (ref 0.5–1.5)
O2 Saturation: 39.6 %
Total hemoglobin: 13 g/dL (ref 12.0–16.0)

## 2012-10-22 LAB — ACETAMINOPHEN LEVEL: Acetaminophen (Tylenol), Serum: <15 ug/mL (ref 10–30)

## 2012-10-22 LAB — URINE MICROSCOPIC-ADD ON

## 2012-10-22 MED ORDER — SODIUM CHLORIDE 0.9 % IV BOLUS (SEPSIS)
1000.0000 mL | Freq: Once | INTRAVENOUS | Status: AC
  Administered 2012-10-22: 1000 mL via INTRAVENOUS

## 2012-10-22 NOTE — ED Notes (Signed)
Phlebotomy at bedside to draw carboxyhemoglobin.

## 2012-10-22 NOTE — ED Provider Notes (Signed)
History    CSN: [id number] Arrival date & time 10/22/12  1242  First MD Initiated Contact with Patient 10/22/12 1301     No chief complaint on file.  (Consider location/radiation/quality/duration/timing/severity/associated sxs/prior Treatment) The history is provided by the patient.  Courtney Ellis is a 51 y.o. female hx of HTN, HIV here with heroin overdose. Patient injected some heroin about 11 AM this morning. Her children found her altered and called the ambulance. She was altered initially was given 3 mg Narcan by EMS and now awake and alert. Drank alcohol yesterday but not today. Denies cocaine use. EMS also found that her carboxyhemoglobin was elevated. Patient denies open flames in the house. She also denies suicidal or homicidal ideations.     Past Medical History  Diagnosis Date  . Hypertension   . HIV infection    History reviewed. No pertinent past surgical history. Family History  Problem Relation Age of Onset  . Hypertension Mother    History  Substance Use Topics  . Smoking status: Current Every Day Smoker -- 0.30 packs/day    Types: Cigarettes  . Smokeless tobacco: Never Used  . Alcohol Use: Yes   OB History   Grav Para Term Preterm Abortions TAB SAB Ect Mult Living                 Review of Systems  Unable to perform ROS: Mental status change    Allergies  Review of patient's allergies indicates no known allergies.  Home Medications   Current Outpatient Rx  Name  Route  Sig  Dispense  Refill  . Darunavir Ethanolate (PREZISTA) 800 MG tablet   Oral   Take 1 tablet (800 mg total) by mouth daily.   30 tablet   11   . diazepam (VALIUM) 2 MG tablet   Oral   Take 1 tablet (2 mg total) by mouth at bedtime as needed for anxiety.   30 tablet   5   . Dolutegravir Sodium 50 MG TABS   Oral   Take 1 tablet (50 mg total) by mouth daily.   30 tablet   5   . emtricitabine-tenofovir (TRUVADA) 200-300 MG per tablet   Oral   Take 1 tablet by mouth  daily.   30 tablet   11   . ritonavir (NORVIR) 100 MG TABS   Oral   Take 1 tablet (100 mg total) by mouth daily with breakfast.   30 tablet   11   . traMADol (ULTRAM) 50 MG tablet   Oral   Take 1 tablet (50 mg total) by mouth every 6 (six) hours as needed for pain.   20 tablet   0    BP 148/86  Pulse 62  Temp(Src) 98.3 F (36.8 C) (Oral)  Resp 14  SpO2 96%  LMP 09/08/2011 Physical Exam  Nursing note and vitals reviewed. Constitutional: She is oriented to person, place, and time.  Tearful, sad, not suicidal   HENT:  Head: Normocephalic.  Mouth/Throat: Oropharynx is clear and moist.  Eyes: Conjunctivae are normal. Pupils are equal, round, and reactive to light.  Pupils small but reactive   Neck: Normal range of motion. Neck supple.  Cardiovascular: Normal rate and normal heart sounds.   Pulmonary/Chest: Effort normal and breath sounds normal. No respiratory distress. She has no wheezes. She has no rales.  Abdominal: Soft. Bowel sounds are normal. She exhibits no distension. There is no tenderness. There is no rebound and no guarding.  Musculoskeletal:  Normal range of motion.  Neurological: She is alert and oriented to person, place, and time.  Nl strength and sensation throughout   Skin: Skin is warm and dry.  Psychiatric:  Tearful, not suicidal     ED Course  Procedures (including critical care time) Labs Reviewed  CARBOXYHEMOGLOBIN - Abnormal; Notable for the following:    Carboxyhemoglobin 3.8 (*)    All other components within normal limits  CBC WITH DIFFERENTIAL - Abnormal; Notable for the following:    Neutrophils Relative % 82 (*)    All other components within normal limits  COMPREHENSIVE METABOLIC PANEL - Abnormal; Notable for the following:    Glucose, Bld 122 (*)    Albumin 3.2 (*)    AST 43 (*)    GFR calc non Af Amer 85 (*)    All other components within normal limits  SALICYLATE LEVEL - Abnormal; Notable for the following:    Salicylate Lvl  <2.0 (*)    All other components within normal limits  URINALYSIS, ROUTINE W REFLEX MICROSCOPIC - Abnormal; Notable for the following:    Glucose, UA 100 (*)    Protein, ur >300 (*)    Urobilinogen, UA 2.0 (*)    All other components within normal limits  URINE RAPID DRUG SCREEN (HOSP PERFORMED) - Abnormal; Notable for the following:    Opiates POSITIVE (*)    Tetrahydrocannabinol POSITIVE (*)    All other components within normal limits  URINE MICROSCOPIC-ADD ON - Abnormal; Notable for the following:    Squamous Epithelial / LPF FEW (*)    All other components within normal limits  ETHANOL  ACETAMINOPHEN LEVEL   No results found. No diagnosis found.   Date: 10/22/2012  Rate: 69  Rhythm: normal sinus rhythm  QRS Axis: normal  Intervals: normal  ST/T Wave abnormalities: normal  Conduction Disutrbances:none  Narrative Interpretation:   Old EKG Reviewed: unchanged    MDM  Courtney Ellis is a 52 y.o. female here with AMS likely from heroin overdose. Labs unremarkable. UDS + opiate, marijuana. Patient awake and alert and observed for 2 hrs. Carbon monoxide 3.8, only minimally elevated. I think its likely from her hypoventilation when she passed out from the heroin. Stable for d/c. Return precautions given.     Richardean Canal, MD 10/22/12 [phone]

## 2012-10-22 NOTE — ED Notes (Signed)
PER EMS: pt from home, and upon EMS arrival pt was unresponsive, agonal breathing. Adm 1 of narcan and pt began to become more alert. Pt admits to injecting heroin today. EMS adm 3 of narcan in total. Pts house is also + for carbon monoxide per EMS.

## 2012-11-15 ENCOUNTER — Telehealth: Payer: Self-pay | Admitting: *Deleted

## 2012-11-15 NOTE — Telephone Encounter (Signed)
Phone numbers not working

## 2012-12-16 ENCOUNTER — Telehealth: Payer: Self-pay | Admitting: *Deleted

## 2012-12-16 ENCOUNTER — Encounter: Payer: Self-pay | Admitting: *Deleted

## 2012-12-16 NOTE — Telephone Encounter (Signed)
Will send the pt a letter to call for PAP smear appt.

## 2013-01-17 ENCOUNTER — Telehealth: Payer: Self-pay | Admitting: *Deleted

## 2013-01-17 NOTE — Telephone Encounter (Signed)
Message copied by Macy Mis on Mon Jan 17, 2013  8:30 AM ------      Message from: Gardiner Barefoot      Created: Fri Jan 14, 2013  3:18 PM       Patient needs follow up.  She is a walk in.  She may need Max as well for heroin abuse.  Likely needs bridge counseling with no working phone.  Thanks ------

## 2013-01-17 NOTE — Telephone Encounter (Signed)
Per Dr. Luciana Axe, referral made to [facility]

## 2013-02-24 ENCOUNTER — Other Ambulatory Visit: Payer: Self-pay

## 2013-02-25 ENCOUNTER — Other Ambulatory Visit: Payer: Self-pay | Admitting: Internal Medicine

## 2013-04-26 ENCOUNTER — Ambulatory Visit: Payer: Medicaid Other | Admitting: Internal Medicine

## 2013-05-05 ENCOUNTER — Ambulatory Visit: Payer: Medicaid Other | Admitting: Infectious Disease

## 2013-05-16 ENCOUNTER — Telehealth: Payer: Self-pay | Admitting: *Deleted

## 2013-05-16 NOTE — Telephone Encounter (Signed)
Received fax asking for [facility] to be [facility] at fax number [phone]. Faxed the information today.

## 2013-10-07 ENCOUNTER — Telehealth: Payer: Self-pay | Admitting: *Deleted

## 2013-10-07 NOTE — Telephone Encounter (Signed)
Needing lab, MD and PAP smear appts, detectable VL.  Bridge Counseling referral

## 2013-11-10 ENCOUNTER — Telehealth: Payer: Self-pay

## 2013-11-10 NOTE — Telephone Encounter (Addendum)
[facility] calling to schedule patient for return visit after release on 12-18-13. Appointment given for 9-24-1-5 with Dr Luciana Axeomer and labs on 12-29-13. Information being faxed  today.   Patient will need labs and office visit.   Laurell Josephsammy K King, RN

## 2013-11-10 NOTE — Telephone Encounter (Signed)
Patient is known as Courtney [facility] with [facility] .   Medical records during incarceration will reflect this name.    Laurell Josephsammy K Audry Pecina, RN

## 2013-12-29 ENCOUNTER — Other Ambulatory Visit: Payer: Medicaid Other

## 2014-01-12 ENCOUNTER — Ambulatory Visit: Payer: Medicaid Other | Admitting: Internal Medicine

## 2014-02-06 ENCOUNTER — Encounter: Payer: Self-pay | Admitting: Internal Medicine

## 2014-02-06 ENCOUNTER — Ambulatory Visit (INDEPENDENT_AMBULATORY_CARE_PROVIDER_SITE_OTHER): Payer: Medicaid Other | Admitting: Internal Medicine

## 2014-02-06 VITALS — BP 156/99 | HR 66 | Temp 98.2°F | Ht 63.0 in | Wt 132.0 lb

## 2014-02-06 DIAGNOSIS — G44209 Tension-type headache, unspecified, not intractable: Secondary | ICD-10-CM

## 2014-02-06 DIAGNOSIS — Z113 Encounter for screening for infections with a predominantly sexual mode of transmission: Secondary | ICD-10-CM

## 2014-02-06 DIAGNOSIS — Z79899 Other long term (current) drug therapy: Secondary | ICD-10-CM

## 2014-02-06 DIAGNOSIS — G4700 Insomnia, unspecified: Secondary | ICD-10-CM

## 2014-02-06 DIAGNOSIS — Z23 Encounter for immunization: Secondary | ICD-10-CM

## 2014-02-06 DIAGNOSIS — B20 Human immunodeficiency virus [HIV] disease: Secondary | ICD-10-CM

## 2014-02-06 DIAGNOSIS — B182 Chronic viral hepatitis C: Secondary | ICD-10-CM

## 2014-02-06 DIAGNOSIS — R030 Elevated blood-pressure reading, without diagnosis of hypertension: Secondary | ICD-10-CM

## 2014-02-06 LAB — COMPLETE METABOLIC PANEL WITH GFR
ALBUMIN: 4.2 g/dL (ref 3.5–5.2)
ALK PHOS: 65 U/L (ref 39–117)
ALT: 29 U/L (ref 0–35)
AST: 35 U/L (ref 0–37)
BUN: 10 mg/dL (ref 6–23)
CALCIUM: 10 mg/dL (ref 8.4–10.5)
CO2: 27 meq/L (ref 19–32)
Chloride: 102 mEq/L (ref 96–112)
Creat: 0.72 mg/dL (ref 0.50–1.10)
GLUCOSE: 77 mg/dL (ref 70–99)
POTASSIUM: 4.8 meq/L (ref 3.5–5.3)
SODIUM: 139 meq/L (ref 135–145)
TOTAL PROTEIN: 7.9 g/dL (ref 6.0–8.3)
Total Bilirubin: 0.3 mg/dL (ref 0.2–1.2)

## 2014-02-06 LAB — CBC WITH DIFFERENTIAL/PLATELET
BASOS ABS: 0 10*3/uL (ref 0.0–0.1)
BASOS PCT: 0 % (ref 0–1)
Eosinophils Absolute: 0.1 10*3/uL (ref 0.0–0.7)
Eosinophils Relative: 2 % (ref 0–5)
HEMATOCRIT: 40.8 % (ref 36.0–46.0)
Hemoglobin: 13.8 g/dL (ref 12.0–15.0)
LYMPHS PCT: 33 % (ref 12–46)
Lymphs Abs: 2 10*3/uL (ref 0.7–4.0)
MCH: 30.8 pg (ref 26.0–34.0)
MCHC: 33.8 g/dL (ref 30.0–36.0)
MCV: 91.1 fL (ref 78.0–100.0)
Monocytes Absolute: 0.5 10*3/uL (ref 0.1–1.0)
Monocytes Relative: 8 % (ref 3–12)
NEUTROS ABS: 3.5 10*3/uL (ref 1.7–7.7)
NEUTROS PCT: 57 % (ref 43–77)
PLATELETS: 411 10*3/uL — AB (ref 150–400)
RBC: 4.48 MIL/uL (ref 3.87–5.11)
RDW: 12.7 % (ref 11.5–15.5)
WBC: 6.1 10*3/uL (ref 4.0–10.5)

## 2014-02-06 LAB — LIPID PANEL
CHOLESTEROL: 209 mg/dL — AB (ref 0–200)
HDL: 57 mg/dL (ref >39)
LDL CALC: 118 mg/dL — AB (ref 0–99)
TRIGLYCERIDES: 172 mg/dL — AB (ref <150)
Total CHOL/HDL Ratio: 3.7 Ratio
VLDL: 34 mg/dL (ref 0–40)

## 2014-02-06 MED ORDER — RITONAVIR 100 MG PO TABS: 100.0000 mg | ORAL_TABLET | Freq: Every day | ORAL | Status: DC

## 2014-02-06 MED ORDER — DARUNAVIR ETHANOLATE 800 MG PO TABS: 800.0000 mg | ORAL_TABLET | Freq: Every day | ORAL | Status: DC

## 2014-02-06 MED ORDER — DIAZEPAM 2 MG PO TABS: 2.0000 mg | ORAL_TABLET | Freq: Every evening | ORAL | Status: DC | PRN

## 2014-02-06 MED ORDER — AMLODIPINE BESYLATE 5 MG PO TABS: 5.0000 mg | ORAL_TABLET | Freq: Every day | ORAL | Status: DC

## 2014-02-06 MED ORDER — ABACAVIR-DOLUTEGRAVIR-LAMIVUD 600-50-300 MG PO TABS: 1.0000 | ORAL_TABLET | Freq: Every day | ORAL | Status: DC

## 2014-02-06 NOTE — Assessment & Plan Note (Signed)
Will continue with Triumeq and Prezista/n.  Labs today and again in about 5 weeks.

## 2014-02-06 NOTE — Assessment & Plan Note (Signed)
Will try norvasc now and she is to establish with a PCP asap.

## 2014-02-06 NOTE — Assessment & Plan Note (Signed)
Will try valium again 

## 2014-02-06 NOTE — Assessment & Plan Note (Signed)
Will do elastography and consider for treatment once stable on HIV meds.

## 2014-02-06 NOTE — Progress Notes (Signed)
   Subjective:    Patient ID: Courtney Ellis, female    DOB: 04/09/1961, 53 y.o.   MRN: [id number]  HPI She comes in here to reestablish care after her time in jail.  She was last seen in early 2014 and was on darunavir/n, tivicay and truvada.  In jail she was changed to Triumeq and presista/n due to some mild renal insufficiency.  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  She has been out of meds 2 days.  Also with active chronic hepatitis C.  Some weight loss since leaving jail and equates that to working 12 hours a day.  Some headache and insomnia.  Recent stressors with her daughter sent to jail today.  Has not yet established with PCP.  Headache in front, no n/v, no photophobia, does not wake her up from sleep.    Review of Systems  Constitutional: Negative for activity change, appetite change and fatigue.  HENT: Negative for trouble swallowing.   Eyes: Negative for visual disturbance.  Respiratory: Negative for cough and shortness of breath.   Cardiovascular: Negative for chest pain.  Gastrointestinal: Negative for nausea and diarrhea.  Skin: Negative for rash.  Neurological: Positive for headaches. Negative for dizziness and light-headedness.  Psychiatric/Behavioral: Positive for sleep disturbance. Negative for dysphoric mood.       Objective:   Physical Exam  Constitutional: She appears well-developed and well-nourished. No distress.  HENT:  Mouth/Throat: No oropharyngeal exudate.  Eyes: No scleral icterus.  Cardiovascular: Normal rate, regular rhythm and normal heart sounds.   No murmur heard. Pulmonary/Chest: Effort normal and breath sounds normal. No respiratory distress. She has no wheezes.  Musculoskeletal: She exhibits no edema.  Lymphadenopathy:    She has no cervical adenopathy.  Skin: No rash noted.  Psychiatric: She has a normal mood and affect.          Assessment & Plan:

## 2014-02-06 NOTE — Assessment & Plan Note (Signed)
Likely related to stressors, elevated bp.  Tylenol as needed, avoiding nsaids with mild renal insufficiency.

## 2014-02-07 LAB — RPR

## 2014-02-08 ENCOUNTER — Telehealth: Payer: Self-pay

## 2014-02-08 LAB — T-HELPER CELL (CD4) - (RCID CLINIC ONLY)
CD4 T CELL ABS: 250 /uL — AB (ref 400–2700)
CD4 T CELL HELPER: 11 % — AB (ref 33–55)

## 2014-02-08 LAB — HIV-1 RNA QUANT-NO REFLEX-BLD
HIV 1 RNA Quant: 21 copies/mL (ref <20)
HIV-1 RNA Quant, Log: 1.32 {Log} — ABNORMAL HIGH (ref <1.30)

## 2014-02-08 NOTE — Telephone Encounter (Signed)
Contacted patient to schedule appointment to establish care. Patient works 12 hour shifts and is unable to schedule at this time.

## 2014-02-10 ENCOUNTER — Telehealth: Payer: Self-pay | Admitting: *Deleted

## 2014-02-10 LAB — HLA B*5701: HLA-B*5701 w/rflx HLA-B High: NEGATIVE

## 2014-02-10 NOTE — Telephone Encounter (Signed)
Patient declined PCP care.

## 2014-02-18 ENCOUNTER — Encounter (HOSPITAL_COMMUNITY): Payer: Self-pay | Admitting: Emergency Medicine

## 2014-02-18 ENCOUNTER — Emergency Department (INDEPENDENT_AMBULATORY_CARE_PROVIDER_SITE_OTHER)
Admission: EM | Admit: 2014-02-18 | Discharge: 2014-02-18 | Disposition: A | Discharge status: Home / Self Care | Payer: Medicaid Other | Source: Home / Self Care | Attending: Emergency Medicine | Admitting: Emergency Medicine

## 2014-02-18 DIAGNOSIS — L03031 Cellulitis of right toe: Secondary | ICD-10-CM

## 2014-02-18 MED ORDER — CLINDAMYCIN HCL 300 MG PO CAPS: 300.0000 mg | ORAL_CAPSULE | Freq: Four times a day (QID) | ORAL | Status: DC

## 2014-02-18 MED ORDER — HYDROCODONE-ACETAMINOPHEN 5-325 MG PO TABS: ORAL_TABLET | ORAL | Status: DC

## 2014-02-18 MED ORDER — HYDROCODONE-ACETAMINOPHEN 5-325 MG PO TABS: 2.0000 | ORAL_TABLET | Freq: Once | ORAL | Status: DC

## 2014-02-18 MED ORDER — MUPIROCIN 2 % EX OINT: 1.0000 "application " | TOPICAL_OINTMENT | Freq: Three times a day (TID) | CUTANEOUS | Status: DC

## 2014-02-18 NOTE — ED Notes (Signed)
Pt states that she took her sock off yesterday 02/17/2014 and notice a a dead bug in her sock and shortly after that her right 5th toe started swelling and itching.

## 2014-02-18 NOTE — ED Provider Notes (Signed)
Chief Complaint   Insect Bite   History of Present Illness   Courtney Ellis is a 53 year old HIV-positive female who has had swelling, pain, and itching of the last 3 toes of her right foot since last night. She denies any injury. It's tender to touch. It hurts to wear shoe on the foot. She denies any insect or bug bite, however she did find a crushed bug on the outside of her sock after she began experience the symptoms. She's had no fever or chills. She denies any swelling of the foot, ankle, or lower leg. She's never had anything like this before. No history of gout.  Review of Systems   Other than as noted above, the patient denies any of the following symptoms: Systemic:  No fevers or chills. Musculoskeletal:  No joint pain or arthritis.  Neurological:  No muscular weakness, paresthesias.   PMFSH   Past medical history, family history, social history, meds, and allergies were reviewed.     Physical  Examination     Vital signs:  BP 125/84  Pulse 88  Temp(Src) 97.4 F (36.3 C) (Oral)  Resp 18  SpO2 98%  LMP 09/08/2011 Gen:  Alert and oriented times 3.  In no distress. Musculoskeletal:  Exam of the foot reveals there is minimal swelling, but she has major pain to palpation over the last 3 toes of the right foot, extending up onto the dorsum of the foot. There is some maceration and cracking of the skin between the toes, there is also a small excoriation on the dorsum of the foot. No purulent drainage. No pain to palpation over the big toe MTP joint.  Otherwise, all joints had a full a ROM with no swelling, bruising or deformity.  No edema, pulses full. Extremities were warm and pink.  Capillary refill was brisk.  Skin:  Clear, warm and dry.  No rash. Neuro:  Alert and oriented times 3.  Muscle strength was normal.  Sensation was intact to light touch.     Course in Urgent Care Center   The following medications were given:  Medications  HYDROcodone-acetaminophen  (NORCO/VICODIN) 5-325 MG per tablet 2 tablet (not administered)    She was placed in a postoperative boot and given crutches.  Assessment   The encounter diagnosis was Cellulitis of toe of right foot.  Differential diagnosis includes cellulitis, gout, or insect bite. There is no history of trauma, so I don't think injury is very likely.  Plan    1.  Meds:  The following meds were prescribed:   Discharge Medication List as of 02/18/2014  5:23 PM    START taking these medications   Details  clindamycin (CLEOCIN) 300 MG capsule Take 1 capsule (300 mg total) by mouth 4 (four) times daily., Starting 02/18/2014, Until Discontinued, Normal    HYDROcodone-acetaminophen (NORCO/VICODIN) 5-325 MG per tablet 1 to 2 tabs every 4 to 6 hours as needed for pain., Print    mupirocin ointment (BACTROBAN) 2 % Apply 1 application topically 3 (three) times daily., Starting 02/18/2014, Until Discontinued, Normal        2.  Patient Education/Counseling:  The patient was given appropriate handouts, self care instructions, and instructed in symptomatic relief.  She was told to stay off her feet as much as possible and elevate the leg, apply moist warm compresses.  3.  Follow up:  The patient was told to follow up here for a scheduled recheck in 48 hours, and told her she should become  worse with increasing pain, swelling, or fever to return directly to the emergency room.     Reuben Likesavid C Derwood Becraft, MD 02/18/14 [phone]

## 2014-02-18 NOTE — Discharge Instructions (Signed)
Stay off feet and elevate leg.    Apply moist, warm compress every 2 hours for 15 minutes.  If any fever or worse swelling go to emergency room.  Return here in 2 days for recheck.

## 2014-02-20 ENCOUNTER — Emergency Department (HOSPITAL_COMMUNITY): Payer: Medicaid Other

## 2014-02-20 ENCOUNTER — Inpatient Hospital Stay (HOSPITAL_COMMUNITY)
Admission: EM | Admit: 2014-02-20 | Discharge: 2014-02-25 | DRG: 602 | Disposition: A | Discharge status: Home / Self Care | Payer: Medicaid Other | Attending: Oncology | Admitting: Oncology

## 2014-02-20 ENCOUNTER — Encounter (HOSPITAL_COMMUNITY): Payer: Self-pay | Admitting: Emergency Medicine

## 2014-02-20 ENCOUNTER — Emergency Department (INDEPENDENT_AMBULATORY_CARE_PROVIDER_SITE_OTHER)
Admission: EM | Admit: 2014-02-20 | Discharge: 2014-02-20 | Disposition: A | Discharge status: Home / Self Care | Payer: Medicaid Other | Source: Home / Self Care | Attending: Family Medicine | Admitting: Family Medicine

## 2014-02-20 DIAGNOSIS — F17210 Nicotine dependence, cigarettes, uncomplicated: Secondary | ICD-10-CM | POA: Diagnosis present

## 2014-02-20 DIAGNOSIS — N170 Acute kidney failure with tubular necrosis: Secondary | ICD-10-CM | POA: Diagnosis present

## 2014-02-20 DIAGNOSIS — Z79899 Other long term (current) drug therapy: Secondary | ICD-10-CM

## 2014-02-20 DIAGNOSIS — N008 Acute nephritic syndrome with other morphologic changes: Secondary | ICD-10-CM | POA: Diagnosis present

## 2014-02-20 DIAGNOSIS — I10 Essential (primary) hypertension: Secondary | ICD-10-CM | POA: Diagnosis present

## 2014-02-20 DIAGNOSIS — G4700 Insomnia, unspecified: Secondary | ICD-10-CM | POA: Diagnosis present

## 2014-02-20 DIAGNOSIS — M79673 Pain in unspecified foot: Secondary | ICD-10-CM

## 2014-02-20 DIAGNOSIS — L02611 Cutaneous abscess of right foot: Principal | ICD-10-CM | POA: Diagnosis present

## 2014-02-20 DIAGNOSIS — A4902 Methicillin resistant Staphylococcus aureus infection, unspecified site: Secondary | ICD-10-CM | POA: Diagnosis present

## 2014-02-20 DIAGNOSIS — L03119 Cellulitis of unspecified part of limb: Secondary | ICD-10-CM

## 2014-02-20 DIAGNOSIS — L02619 Cutaneous abscess of unspecified foot: Secondary | ICD-10-CM | POA: Diagnosis present

## 2014-02-20 DIAGNOSIS — B20 Human immunodeficiency virus [HIV] disease: Secondary | ICD-10-CM | POA: Diagnosis present

## 2014-02-20 DIAGNOSIS — H61899 Other specified disorders of external ear, unspecified ear: Secondary | ICD-10-CM

## 2014-02-20 DIAGNOSIS — L03115 Cellulitis of right lower limb: Secondary | ICD-10-CM

## 2014-02-20 DIAGNOSIS — W57XXXA Bitten or stung by nonvenomous insect and other nonvenomous arthropods, initial encounter: Secondary | ICD-10-CM | POA: Diagnosis present

## 2014-02-20 DIAGNOSIS — F419 Anxiety disorder, unspecified: Secondary | ICD-10-CM | POA: Diagnosis present

## 2014-02-20 DIAGNOSIS — N179 Acute kidney failure, unspecified: Secondary | ICD-10-CM | POA: Diagnosis not present

## 2014-02-20 DIAGNOSIS — H938X1 Other specified disorders of right ear: Secondary | ICD-10-CM | POA: Diagnosis present

## 2014-02-20 HISTORY — DX: Opioid abuse, uncomplicated: F11.10

## 2014-02-20 LAB — CBC WITH DIFFERENTIAL/PLATELET
BASOS ABS: 0 10*3/uL (ref 0.0–0.1)
Basophils Relative: 0 % (ref 0–1)
Eosinophils Absolute: 0.1 10*3/uL (ref 0.0–0.7)
Eosinophils Relative: 2 % (ref 0–5)
HCT: 41.8 % (ref 36.0–46.0)
HEMOGLOBIN: 14 g/dL (ref 12.0–15.0)
Lymphocytes Relative: 30 % (ref 12–46)
Lymphs Abs: 2 10*3/uL (ref 0.7–4.0)
MCH: 30.4 pg (ref 26.0–34.0)
MCHC: 33.5 g/dL (ref 30.0–36.0)
MCV: 90.9 fL (ref 78.0–100.0)
MONOS PCT: 9 % (ref 3–12)
Monocytes Absolute: 0.6 10*3/uL (ref 0.1–1.0)
NEUTROS ABS: 3.8 10*3/uL (ref 1.7–7.7)
Neutrophils Relative %: 59 % (ref 43–77)
Platelets: 236 10*3/uL (ref 150–400)
RBC: 4.6 MIL/uL (ref 3.87–5.11)
RDW: 11.9 % (ref 11.5–15.5)
WBC: 6.5 10*3/uL (ref 4.0–10.5)

## 2014-02-20 MED ORDER — VANCOMYCIN HCL 10 G IV SOLR
1250.0000 mg | Freq: Once | INTRAVENOUS | Status: AC
  Administered 2014-02-21: 1250 mg via INTRAVENOUS
  Filled 2014-02-20: qty 1250

## 2014-02-20 NOTE — ED Notes (Signed)
Patient reports she was seen x 2 days ago for foot pain and swelling following and insect bite. Patient reports that this morning she noticed that between the fourth and fifth toe discharge of pus. Patient reports pain is worse at night and medication is not helping. NAD.

## 2014-02-20 NOTE — ED Notes (Signed)
Patient transported to X-ray 

## 2014-02-20 NOTE — ED Notes (Signed)
Patient is in x-ray. Cultures have been collected.

## 2014-02-20 NOTE — Discharge Instructions (Signed)
[facility] and possible admission to the hospital for IV antibiotics.  Cellulitis Cellulitis is an infection of the skin and the tissue beneath it. The infected area is usually red and tender. Cellulitis occurs most often in the arms and lower legs.  CAUSES  Cellulitis is caused by bacteria that enter the skin through cracks or cuts in the skin. The most common types of bacteria that cause cellulitis are staphylococci and streptococci. SIGNS AND SYMPTOMS   Redness and warmth.  Swelling.  Tenderness or pain.  Fever. DIAGNOSIS  Your health care provider can usually determine what is wrong based on a physical exam. Blood tests may also be done. TREATMENT  Treatment usually involves taking an antibiotic medicine. HOME CARE INSTRUCTIONS   Take your antibiotic medicine as directed by your health care provider. Finish the antibiotic even if you start to feel better.  Keep the infected arm or leg elevated to reduce swelling.  Apply a warm cloth to the affected area up to 4 times per day to relieve pain.  Take medicines only as directed by your health care provider.  Keep all follow-up visits as directed by your health care provider. SEEK MEDICAL CARE IF:   You notice red streaks coming from the infected area.  Your red area gets larger or turns dark in color.  Your bone or joint underneath the infected area becomes painful after the skin has healed.  Your infection returns in the same area or another area.  You notice a swollen bump in the infected area.  You develop new symptoms.  You have a fever. SEEK IMMEDIATE MEDICAL CARE IF:   You feel very sleepy.  You develop vomiting or diarrhea.  You have a general ill feeling (malaise) with muscle aches and pains. MAKE SURE YOU:   Understand these instructions.  Will watch your condition.  Will get help right away if you are not doing well or get worse. Document Released:  01/15/2005 Document Revised: 08/22/2013 Document Reviewed: 06/23/2011 [facility] Patient Information 2015 [employer] [place]. This information is not intended to replace advice given to you by your health care provider. Make sure you discuss any questions you have with your health care provider.

## 2014-02-20 NOTE — ED Provider Notes (Signed)
CSN: [id number]     Arrival date & time 02/20/14  1828 History   First MD Initiated Contact with Patient 02/20/14 2256     Chief Complaint  Patient presents with  . Wound Infection     (Consider location/radiation/quality/duration/timing/severity/associated sxs/prior Treatment) HPI Courtney Spiceonya M Ellis is a 53 y.o. female with past medical history of HIV, hypertension, heroin abuse, presenting with pain and swelling of her right foot. Patient states this occurred 5 days ago while at work, she really she had a spider bite. She was seen at urgent care 3 days ago was prescribed clindamycin. Despite this she had worsening pain swelling and drainage of purulence from the area. Her foot is also pruritic. She describes the pain as throbbing. The pain radiates up to her mid tibia down is getting worse. She denies any fevers or recent infections, She has no chest pain or shortness of breath. Patient has no further complaints.  10 Systems reviewed and are negative for acute change except as noted in the HPI.     Past Medical History  Diagnosis Date  . Hypertension   . HIV infection   . Heroin abuse    History reviewed. No pertinent past surgical history. Family History  Problem Relation Age of Onset  . Hypertension Mother    History  Substance Use Topics  . Smoking status: Current Every Day Smoker -- 0.30 packs/day    Types: Cigarettes  . Smokeless tobacco: Never Used     Comment: 2 cigarettes a day  . Alcohol Use: No   OB History    No data available     Review of Systems    Allergies  Review of patient's allergies indicates no known allergies.  Home Medications   Prior to Admission medications   Medication Sig Start Date End Date Taking? Authorizing Provider  Abacavir-Dolutegravir-Lamivud 600-50-300 MG TABS Take 1 tablet by mouth daily. 02/06/14   Gardiner Barefootobert W Comer, MD  amLODipine (NORVASC) 5 MG tablet Take 1 tablet (5 mg total) by mouth daily. 02/06/14   Gardiner Barefootobert W Comer, MD   clindamycin (CLEOCIN) 300 MG capsule Take 1 capsule (300 mg total) by mouth 4 (four) times daily. 02/18/14   Reuben Likesavid C Keller, MD  Darunavir Ethanolate (PREZISTA) 800 MG tablet Take 1 tablet (800 mg total) by mouth daily. 02/06/14   Gardiner Barefootobert W Comer, MD  diazepam (VALIUM) 2 MG tablet Take 1 tablet (2 mg total) by mouth at bedtime as needed for anxiety. 02/06/14   Gardiner Barefootobert W Comer, MD  HYDROcodone-acetaminophen (NORCO/VICODIN) 5-325 MG per tablet 1 to 2 tabs every 4 to 6 hours as needed for pain. 02/18/14   Reuben Likesavid C Keller, MD  mupirocin ointment (BACTROBAN) 2 % Apply 1 application topically 3 (three) times daily. 02/18/14   Reuben Likesavid C Keller, MD  ritonavir (NORVIR) 100 MG TABS tablet Take 1 tablet (100 mg total) by mouth daily. 02/06/14   Gardiner Barefootobert W Comer, MD   BP 114/84 mmHg  Pulse 75  Temp(Src) 98 F (36.7 C) (Oral)  Resp 18  Ht 5\' 3"  (1.6 m)  Wt 133 lb (60.328 kg)  BMI 23.57 kg/m2  SpO2 98%  LMP 09/08/2011 Physical Exam  Constitutional: She is oriented to person, place, and time. She appears well-developed and well-nourished. No distress.  HENT:  Head: Normocephalic and atraumatic.  Nose: Nose normal.  Mouth/Throat: Oropharynx is clear and moist. No oropharyngeal exudate.  Eyes: Conjunctivae and EOM are normal. Pupils are equal, round, and reactive to light. No scleral icterus.  Neck: Normal range of motion. Neck supple. No JVD present. No tracheal deviation present. No thyromegaly present.  Cardiovascular: Normal rate, regular rhythm and normal heart sounds.  Exam reveals no gallop and no friction rub.   No murmur heard. Pulmonary/Chest: Effort normal and breath sounds normal. No respiratory distress. She has no wheezes. She exhibits no tenderness.  Abdominal: Soft. Bowel sounds are normal. She exhibits no distension and no mass. There is no tenderness. There is no rebound and no guarding.  Musculoskeletal: Normal range of motion. She exhibits no edema or tenderness.  Lymphadenopathy:    She  has no cervical adenopathy.  Neurological: She is alert and oriented to person, place, and time.  Skin: Skin is warm and dry. Rash noted. She is not diaphoretic. There is erythema. No pallor.  Source of infection likely between fourth and fifth digits of the right lower extremity. Foot is diffusely swollen, 2 times the size of the left. There is warmth and tenderness proximally to the mid tibia. Patient has 2+ pulses and normal sensation in the right lower extremity.  Nursing note and vitals reviewed.   ED Course  Procedures (including critical care time) Labs Review Labs Reviewed  BASIC METABOLIC PANEL - Abnormal; Notable for the following:    Sodium 136 (*)    All other components within normal limits  COMPREHENSIVE METABOLIC PANEL - Abnormal; Notable for the following:    Glucose, Bld 146 (*)    Albumin 2.8 (*)    Total Bilirubin 0.2 (*)    All other components within normal limits  CULTURE, BLOOD (ROUTINE X 2)  CULTURE, BLOOD (ROUTINE X 2)  WOUND CULTURE  CBC WITH DIFFERENTIAL  CBC    Imaging Review Dg Foot Complete Right  02/21/2014   CLINICAL DATA:  Initial evaluation for spider bite on fifth digit. Swelling.  EXAM: RIGHT FOOT COMPLETE - 3+ VIEW  COMPARISON:  None.  FINDINGS: No acute fracture or dislocation. Joint spaces are maintained. 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 No significant swelling or other soft tissue abnormality identified about the foot. No radiopaque foreign body.  IMPRESSION: No acute abnormality about the right foot.   Electronically Signed   By: Rise MuBenjamin  McClintock M.D.   On: 02/21/2014 00:12     EKG Interpretation None      MDM   Final diagnoses:  Foot pain    Patient presents emergency department for worsening foot pain, swelling, drainage of pus despite outpatient clindamycin. Patient also has a history of HIV and will require inpatient IV antibiotics for treatment. Blood cultures ordered, vancomycin was given  emergency department.  She failed out patient therapy.  She will be admitted to int med teaching service, med surg unit.    Tomasita CrumbleAdeleke Nadyne Gariepy, MD 02/21/14 [phone]

## 2014-02-20 NOTE — ED Provider Notes (Signed)
CSN: [id number]     Arrival date & time 02/20/14  1617 History   First MD Initiated Contact with Patient 02/20/14 1741     Chief Complaint  Patient presents with  . Foot Problem   (Consider location/radiation/quality/duration/timing/severity/associated sxs/prior Treatment) HPI Comments: Patient presents for re-evaluation of right foot cellulitis. Was [facility] for same on [facility]18/[facility].  States despite applying mupirocin to foot and taking clindamycin as directed, symptoms have become worse.  Notable increase in pain, swelling, redness and now has open wound between her 4th and 5th toes that is actively draining purulent material. [facility]CP[facility]   The history is provided by the patient.    Past Medical History  Diagnosis Date  . Hypertension   . HIV infection    History reviewed. No pertinent past surgical history. Family History  Problem Relation Age of Onset  . Hypertension Mother    History  Substance Use Topics  . Smoking status: Current Every Day Smoker -- 0.30 packs/day    Types: Cigarettes  . Smokeless tobacco: Never Used     Comment: 2 cigarettes a day  . Alcohol Use: No   OB History    No data available     Review of Systems  Constitutional: Negative for fever and chills.  HENT: Negative.   Respiratory: Negative.   Cardiovascular: Negative.   Gastrointestinal: Negative.   Musculoskeletal: Negative.   Skin: Positive for color change and wound.  Allergic/Immunologic: Positive for immunocompromised state.    Allergies  Review of patient's allergies indicates no known allergies.  Home Medications   Prior to Admission medications   Medication Sig Start Date End Date Taking? Authorizing Provider  Abacavir-Dolutegravir-Lamivud 600-50-300 MG TABS Take 1 tablet by mouth daily. 02/06/14   Gardiner Barefootobert W Comer, MD  amLODipine (NORVASC) 5 MG tablet Take 1 tablet (5 mg total) by mouth daily. 02/06/14   Gardiner Barefootobert W Comer, MD  clindamycin (CLEOCIN) 300 MG capsule Take 1 capsule  (300 mg total) by mouth 4 (four) times daily. 02/18/14   Reuben Likesavid C Keller, MD  Darunavir Ethanolate (PREZISTA) 800 MG tablet Take 1 tablet (800 mg total) by mouth daily. 02/06/14   Gardiner Barefootobert W Comer, MD  diazepam (VALIUM) 2 MG tablet Take 1 tablet (2 mg total) by mouth at bedtime as needed for anxiety. 02/06/14   Gardiner Barefootobert W Comer, MD  HYDROcodone-acetaminophen (NORCO/VICODIN) 5-325 MG per tablet 1 to 2 tabs every 4 to 6 hours as needed for pain. 02/18/14   Reuben Likesavid C Keller, MD  mupirocin ointment (BACTROBAN) 2 % Apply 1 application topically 3 (three) times daily. 02/18/14   Reuben Likesavid C Keller, MD  ritonavir (NORVIR) 100 MG TABS tablet Take 1 tablet (100 mg total) by mouth daily. 02/06/14   Gardiner Barefootobert W Comer, MD   BP 151/89 mmHg  Pulse 90  Temp(Src) 98 F (36.7 C) (Oral)  Resp 18  SpO2 98%  LMP 09/08/2011 Physical Exam  Constitutional: She is oriented to person, place, and time. She appears well-developed and well-nourished. No distress.  HENT:  Head: Normocephalic and atraumatic.  Cardiovascular: Normal rate.   Pulmonary/Chest: Effort normal.  Neurological: She is alert and oriented to person, place, and time.  Skin: Skin is warm and dry. There is erythema.     Outlined area is with moderate induration, erythema, STS and discomfort  Psychiatric: She has a normal mood and affect. Her behavior is normal.  Nursing note and vitals reviewed.   ED Course  Procedures (including critical care time) Labs Review  Labs Reviewed - No data to display  Imaging Review No results found.   MDM   1. Cellulitis of right foot   Right foot cellulitis that is not responding favorably to outpatient management with oral antibiotics and local wound care in an immunocompromised patient. Will require evaluation for possible osteomyelitis and will likely require IV antibiotic therapy. Patient requests that [facility] transported [facility] with her husband. Therefore patient discharged with clear  instructions to report directly to [facility]. Patient and husband voice understanding.     Ria ClockJennifer Lee H Mikale Silversmith, [place] 02/20/14 [phone]

## 2014-02-20 NOTE — ED Notes (Signed)
Pt. reports progressing right foot wound infection with drainage onset last Friday , seen here 2 days ago prescribed with oral antibiotic , antibiotic ointment and pain medication with no improvement .

## 2014-02-21 ENCOUNTER — Encounter (HOSPITAL_COMMUNITY): Payer: Self-pay | Admitting: *Deleted

## 2014-02-21 DIAGNOSIS — H61899 Other specified disorders of external ear, unspecified ear: Secondary | ICD-10-CM

## 2014-02-21 DIAGNOSIS — H9209 Otalgia, unspecified ear: Secondary | ICD-10-CM

## 2014-02-21 DIAGNOSIS — L02611 Cutaneous abscess of right foot: Secondary | ICD-10-CM | POA: Diagnosis present

## 2014-02-21 DIAGNOSIS — B20 Human immunodeficiency virus [HIV] disease: Secondary | ICD-10-CM | POA: Diagnosis present

## 2014-02-21 DIAGNOSIS — F17210 Nicotine dependence, cigarettes, uncomplicated: Secondary | ICD-10-CM | POA: Diagnosis present

## 2014-02-21 DIAGNOSIS — G4700 Insomnia, unspecified: Secondary | ICD-10-CM | POA: Diagnosis present

## 2014-02-21 DIAGNOSIS — F1120 Opioid dependence, uncomplicated: Secondary | ICD-10-CM

## 2014-02-21 DIAGNOSIS — F419 Anxiety disorder, unspecified: Secondary | ICD-10-CM | POA: Diagnosis present

## 2014-02-21 DIAGNOSIS — L03115 Cellulitis of right lower limb: Secondary | ICD-10-CM

## 2014-02-21 DIAGNOSIS — I10 Essential (primary) hypertension: Secondary | ICD-10-CM | POA: Diagnosis present

## 2014-02-21 DIAGNOSIS — Z79899 Other long term (current) drug therapy: Secondary | ICD-10-CM | POA: Diagnosis not present

## 2014-02-21 DIAGNOSIS — A4902 Methicillin resistant Staphylococcus aureus infection, unspecified site: Secondary | ICD-10-CM | POA: Diagnosis present

## 2014-02-21 DIAGNOSIS — M79673 Pain in unspecified foot: Secondary | ICD-10-CM | POA: Diagnosis present

## 2014-02-21 DIAGNOSIS — N170 Acute kidney failure with tubular necrosis: Secondary | ICD-10-CM | POA: Diagnosis present

## 2014-02-21 DIAGNOSIS — W57XXXA Bitten or stung by nonvenomous insect and other nonvenomous arthropods, initial encounter: Secondary | ICD-10-CM | POA: Diagnosis present

## 2014-02-21 DIAGNOSIS — H938X1 Other specified disorders of right ear: Secondary | ICD-10-CM | POA: Diagnosis present

## 2014-02-21 DIAGNOSIS — N008 Acute nephritic syndrome with other morphologic changes: Secondary | ICD-10-CM | POA: Diagnosis present

## 2014-02-21 LAB — BASIC METABOLIC PANEL
ANION GAP: 12 (ref 5–15)
BUN: 12 mg/dL (ref 6–23)
CHLORIDE: 99 meq/L (ref 96–112)
CO2: 25 mEq/L (ref 19–32)
Calcium: 9.9 mg/dL (ref 8.4–10.5)
Creatinine, Ser: 0.79 mg/dL (ref 0.50–1.10)
GFR calc non Af Amer: >90 mL/min (ref >90)
Glucose, Bld: 99 mg/dL (ref 70–99)
Potassium: 4.4 mEq/L (ref 3.7–5.3)
SODIUM: 136 meq/L — AB (ref 137–147)

## 2014-02-21 LAB — COMPREHENSIVE METABOLIC PANEL
ALBUMIN: 2.8 g/dL — AB (ref 3.5–5.2)
ALT: 20 U/L (ref 0–35)
AST: 27 U/L (ref 0–37)
Alkaline Phosphatase: 71 U/L (ref 39–117)
Anion gap: 10 (ref 5–15)
BUN: 15 mg/dL (ref 6–23)
CHLORIDE: 102 meq/L (ref 96–112)
CO2: 26 meq/L (ref 19–32)
Calcium: 9.1 mg/dL (ref 8.4–10.5)
Creatinine, Ser: 0.78 mg/dL (ref 0.50–1.10)
GFR calc Af Amer: >90 mL/min (ref >90)
Glucose, Bld: 146 mg/dL — ABNORMAL HIGH (ref 70–99)
Potassium: 3.8 mEq/L (ref 3.7–5.3)
SODIUM: 138 meq/L (ref 137–147)
Total Bilirubin: 0.2 mg/dL — ABNORMAL LOW (ref 0.3–1.2)
Total Protein: 7.3 g/dL (ref 6.0–8.3)

## 2014-02-21 LAB — CBC
HEMATOCRIT: 36.8 % (ref 36.0–46.0)
Hemoglobin: 12.3 g/dL (ref 12.0–15.0)
MCH: 30.4 pg (ref 26.0–34.0)
MCHC: 33.4 g/dL (ref 30.0–36.0)
MCV: 90.9 fL (ref 78.0–100.0)
PLATELETS: 212 10*3/uL (ref 150–400)
RBC: 4.05 MIL/uL (ref 3.87–5.11)
RDW: 12 % (ref 11.5–15.5)
WBC: 4.9 10*3/uL (ref 4.0–10.5)

## 2014-02-21 MED ORDER — HEPARIN SODIUM (PORCINE) 5000 UNIT/ML IJ SOLN
5000.0000 [IU] | Freq: Three times a day (TID) | INTRAMUSCULAR | Status: DC
  Administered 2014-02-21 – 2014-02-25 (×13): 5000 [IU] via SUBCUTANEOUS
  Filled 2014-02-21 (×16): qty 1

## 2014-02-21 MED ORDER — DIAZEPAM 2 MG PO TABS
2.0000 mg | ORAL_TABLET | Freq: Every evening | ORAL | Status: DC | PRN
  Administered 2014-02-23: 2 mg via ORAL
  Filled 2014-02-21: qty 1

## 2014-02-21 MED ORDER — ABACAVIR SULFATE-LAMIVUDINE 600-300 MG PO TABS
1.0000 | ORAL_TABLET | Freq: Every day | ORAL | Status: DC
  Filled 2014-02-21: qty 1

## 2014-02-21 MED ORDER — SODIUM CHLORIDE 0.9 % IJ SOLN
3.0000 mL | Freq: Two times a day (BID) | INTRAMUSCULAR | Status: DC
  Administered 2014-02-21 – 2014-02-25 (×7): 3 mL via INTRAVENOUS

## 2014-02-21 MED ORDER — LAMIVUDINE 150 MG PO TABS
300.0000 mg | ORAL_TABLET | Freq: Every day | ORAL | Status: DC
  Administered 2014-02-21 – 2014-02-22 (×2): 300 mg via ORAL
  Filled 2014-02-21 (×2): qty 2

## 2014-02-21 MED ORDER — HYDROMORPHONE HCL 1 MG/ML IJ SOLN
0.5000 mg | INTRAMUSCULAR | Status: DC | PRN
  Administered 2014-02-21: 0.5 mg via INTRAVENOUS
  Filled 2014-02-21: qty 1

## 2014-02-21 MED ORDER — ABACAVIR SULFATE 300 MG PO TABS
600.0000 mg | ORAL_TABLET | Freq: Every day | ORAL | Status: DC
  Administered 2014-02-21 – 2014-02-25 (×5): 600 mg via ORAL
  Filled 2014-02-21 (×5): qty 2

## 2014-02-21 MED ORDER — RITONAVIR 100 MG PO TABS
100.0000 mg | ORAL_TABLET | Freq: Every day | ORAL | Status: DC
  Administered 2014-02-21 – 2014-02-25 (×5): 100 mg via ORAL
  Filled 2014-02-21 (×6): qty 1

## 2014-02-21 MED ORDER — CIPROFLOXACIN-DEXAMETHASONE 0.3-0.1 % OT SUSP
4.0000 [drp] | Freq: Two times a day (BID) | OTIC | Status: DC
  Administered 2014-02-21 – 2014-02-25 (×7): 4 [drp] via OTIC
  Filled 2014-02-21: qty 7.5

## 2014-02-21 MED ORDER — HYDROMORPHONE HCL 1 MG/ML IJ SOLN
0.5000 mg | Freq: Once | INTRAMUSCULAR | Status: AC
  Administered 2014-02-21: 0.5 mg via INTRAVENOUS
  Filled 2014-02-21: qty 1

## 2014-02-21 MED ORDER — ACETAMINOPHEN 650 MG RE SUPP: 650.0000 mg | Freq: Four times a day (QID) | RECTAL | Status: DC | PRN

## 2014-02-21 MED ORDER — PIPERACILLIN-TAZOBACTAM 3.375 G IVPB
3.3750 g | Freq: Three times a day (TID) | INTRAVENOUS | Status: DC
  Administered 2014-02-21 – 2014-02-22 (×4): 3.375 g via INTRAVENOUS
  Filled 2014-02-21 (×6): qty 50

## 2014-02-21 MED ORDER — OXYCODONE-ACETAMINOPHEN 5-325 MG PO TABS
2.0000 | ORAL_TABLET | Freq: Once | ORAL | Status: AC
  Administered 2014-02-21: 2 via ORAL
  Filled 2014-02-21: qty 2

## 2014-02-21 MED ORDER — KETOROLAC TROMETHAMINE 15 MG/ML IJ SOLN
30.0000 mg | Freq: Once | INTRAMUSCULAR | Status: AC
Start: 2014-02-21 — End: 2014-02-21
  Administered 2014-02-21: 30 mg via INTRAVENOUS
  Filled 2014-02-21: qty 2

## 2014-02-21 MED ORDER — ACETAMINOPHEN 325 MG PO TABS: 650.0000 mg | ORAL_TABLET | Freq: Four times a day (QID) | ORAL | Status: DC | PRN

## 2014-02-21 MED ORDER — DOLUTEGRAVIR SODIUM 50 MG PO TABS
50.0000 mg | ORAL_TABLET | Freq: Every day | ORAL | Status: DC
  Administered 2014-02-21 – 2014-02-25 (×5): 50 mg via ORAL
  Filled 2014-02-21 (×5): qty 1

## 2014-02-21 MED ORDER — DARUNAVIR ETHANOLATE 800 MG PO TABS
800.0000 mg | ORAL_TABLET | Freq: Every day | ORAL | Status: DC
  Administered 2014-02-21 – 2014-02-25 (×5): 800 mg via ORAL
  Filled 2014-02-21 (×6): qty 1

## 2014-02-21 MED ORDER — ABACAVIR-DOLUTEGRAVIR-LAMIVUD 600-50-300 MG PO TABS: 1.0000 | ORAL_TABLET | Freq: Every day | ORAL | Status: DC

## 2014-02-21 MED ORDER — AMLODIPINE BESYLATE 5 MG PO TABS
5.0000 mg | ORAL_TABLET | Freq: Every day | ORAL | Status: DC
  Administered 2014-02-21 – 2014-02-25 (×5): 5 mg via ORAL
  Filled 2014-02-21 (×5): qty 1

## 2014-02-21 MED ORDER — VANCOMYCIN HCL IN DEXTROSE 750-5 MG/150ML-% IV SOLN
750.0000 mg | Freq: Two times a day (BID) | INTRAVENOUS | Status: DC
  Administered 2014-02-21 (×2): 750 mg via INTRAVENOUS
  Filled 2014-02-21 (×4): qty 150

## 2014-02-21 NOTE — Progress Notes (Signed)
Subjective: This AM, she complains of ear pain. Otherwise, she reports her foot pain is better.   Per ENT, she should follow-up as outpatient for her ear lesion and should start Ciprodex ear drops.  Objective: Vital signs in last 24 hours: Filed Vitals:   02/21/14 0400 02/21/14 0415 02/21/14 0452 02/21/14 1432  BP: 124/75 112/70 131/75 125/69  Pulse: 58 58 63 72  Temp:   97.6 F (36.4 C) 97.6 F (36.4 C)  TempSrc:   Oral   Resp:   18   Height:   5\' 3"  (1.6 m)   Weight:   131 lb 6.3 oz (59.6 kg)   SpO2: 98% 97% 100% 100%   Weight change:   Intake/Output Summary (Last 24 hours) at 02/21/14 1836 Last data filed at 02/21/14 0133  Gross per 24 hour  Intake    250 ml  Output      0 ml  Net    250 ml   General: resting in bed, NAD HEENT: PERRL, EOMI, no scleral icterus, polyp-like lesion present in R ear on lateral wall with blood vessels Cardiac: RRR, no rubs, murmurs or gallops Pulm: clear to auscultation bilaterally Abd: soft, nontender, nondistended, BS present Ext: R foot with warmth, erythema, and tenderness swollen compared to left, no crepitus, pulses intact, space between the 4th and 5th digits with a shallow ulceration with purulent drainage; stable from photos documented on admission Neuro: alert and oriented X3, cranial nerves II-XII grossly intact, strength and sensation to light touch equal in bilateral upper and lower extremities   Lab Results: Basic Metabolic Panel:  Recent Labs Lab 02/20/14 2317 02/21/14 0543  NA 136* 138  K 4.4 3.8  CL 99 102  CO2 25 26  GLUCOSE 99 146*  BUN 12 15  CREATININE 0.79 0.78  CALCIUM 9.9 9.1   Liver Function Tests:  Recent Labs Lab 02/21/14 0543  AST 27  ALT 20  ALKPHOS 71  BILITOT 0.2*  PROT 7.3  ALBUMIN 2.8*   CBC:  Recent Labs Lab 02/20/14 2317 02/21/14 0543  WBC 6.5 4.9  NEUTROABS 3.8  --   HGB 14.0 12.3  HCT 41.8 36.8  MCV 90.9 90.9  PLT 236 212   Urine Drug Screen: Drugs of Abuse       Component Value Date/Time   LABOPIA POSITIVE* 10/22/2012 1326   COCAINSCRNUR NONE DETECTED 10/22/2012 1326   LABBENZ NONE DETECTED 10/22/2012 1326   AMPHETMU NONE DETECTED 10/22/2012 1326   THCU POSITIVE* 10/22/2012 1326   LABBARB NONE DETECTED 10/22/2012 1326    Studies/Results: Dg Foot Complete Right  02/21/2014   CLINICAL DATA:  Initial evaluation for spider bite on fifth digit. Swelling.  EXAM: RIGHT FOOT COMPLETE - 3+ VIEW  COMPARISON:  None.  FINDINGS: No acute fracture or dislocation. Joint spaces are maintained. 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 No significant swelling or other soft tissue abnormality identified about the foot. No radiopaque foreign body.  IMPRESSION: No acute abnormality about the right foot.   Electronically Signed   By: Rise MuBenjamin  McClintock M.D.   On: 02/21/2014 00:12   Medications: I have reviewed the patient's current medications. Scheduled Meds: . abacavir  600 mg Oral Daily   And  . lamiVUDine  300 mg Oral Daily  . amLODipine  5 mg Oral Daily  . ciprofloxacin-dexamethasone  4 drop Right Ear BID  . Darunavir Ethanolate  800 mg Oral Q breakfast  . dolutegravir  50 mg Oral Daily  .  heparin  5,000 Units Subcutaneous 3 times per day  . piperacillin-tazobactam (ZOSYN)  IV  3.375 g Intravenous 3 times per day  . ritonavir  100 mg Oral Q breakfast  . sodium chloride  3 mL Intravenous Q12H  . vancomycin  750 mg Intravenous Q12H   Continuous Infusions:  PRN Meds:.acetaminophen **OR** acetaminophen, [START ON 02/22/2014] diazepam Assessment/Plan: Principal Problem:   Cellulitis Active Problems:   Human immunodeficiency virus (HIV) disease  Ms. Dewoody is a 53 year old female with a history of HIV (CD4 count 250, viral load 21 from October 2015), HTN, h/o heroin abuse, anxiety who presents with cellulitis s/p refractory to clindamycin and R ear canal lesion.  Purulent cellulitis of right foot: Appears stable from what was  documented on admission. -Continue broad-spectrum coverage (Abx Day 1) -Follow-up wound culture -Give Toradol 30mg  IV q4h prn for pain given her h/o heroin abuse  R ear canal lesion:  Continue Ciprodex ear drops. -Follow-up with ENT as outpatient  HIV: Continue home meds.  HTN: Normotensive. Continue home meds.  Insomnia/anxiety: Continue home diazepam.  #FEN:  -Diet: Regular  #DVT prophylaxis: heparin 5000 units subcutaneous  #CODE STATUS: FULL CODE  Dispo: Disposition is deferred at this time, awaiting improvement of current medical problems.  Anticipated discharge in approximately 1-2 day(s).   The patient does have a current PCP (No Pcp Per Patient) and does not [facility] after discharge.  The patient does not have transportation limitations that hinder transportation to clinic appointments.  .Services Needed at time of discharge: Y = Yes, Blank = No PT:   OT:   RN:   Equipment:   Other:     LOS: 1 day   Heywood Ilesushil Aitan Rossbach, MD 02/21/2014, 6:36 PM

## 2014-02-21 NOTE — ED Notes (Signed)
Gauze placed around right foot for protection.

## 2014-02-21 NOTE — H&P (Signed)
Date: 02/21/2014               Patient Name:  Courtney Ellis MRN: [id number]  DOB: 10/13/1960 Age / Sex: 53 y.o., female   PCP: No Pcp Per Patient         Medical Service: Internal Medicine Teaching Service         Attending Physician: Dr. Levert FeinsteinJames M Granfortuna, MD    First Contact: Dr. Allena KatzPatel Pager: [phone]  Second Contact: Dr. Mariea ClontsEmokpae Pager: [phone]       After Hours (After 5p/  First Contact Pager: [phone]  weekends / holidays): Second Contact Pager: [phone]   Chief Complaint: cellulits   History of Present Illness:   Patient is a 53 year old female with a history of HIV (CD4 count 250, viral load 21 from October 2015), hypertension, heroin abuse, anxiety who presents with right foot swelling, erythema, and warmth. Patient states that she first noticed that her right foot was itching on 02/16/2014. She states that she took her sock off and noticed something that looked like a spider in the sock. 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 She has noticed a large number of spiders emerging from the cargo. Patient was then evaluated at urgent care on 02/18/2014 and was diagnosed with cellulitis and given a course of clindamycin. Patient reports that her foot did not improve and continued to worsen, becoming more swollen and starting to have purulent drainage by this morning. She states that the foot is also very tender extending well above her ankle.  Patient also reports some right-sided ear pain that has been going on for 1-2 weeks. She also reports having some cough during the same interval that is associated with rhinorrhea. Patient otherwise denies any fevers, chills, chest pain, dyspnea, vomiting, nausea, diarrhea, or constipation. Patient reports that she still smokes around 3 cigarettes a day, but does not drink alcohol and has not used heroin or other illicits in more than a year, since before her recent incarceration.  Upon  evaluation in the emergency department this time, blood cultures were drawn and vancomycin was initiated.    Past Medical History  Diagnosis Date  . Hypertension   . HIV infection   . Heroin abuse    History reviewed. No pertinent past surgical history.  Meds: No current facility-administered medications for this encounter.   Current Outpatient Prescriptions  Medication Sig Dispense Refill  . Abacavir-Dolutegravir-Lamivud 600-50-300 MG TABS Take 1 tablet by mouth daily. 30 tablet 5  . amLODipine (NORVASC) 5 MG tablet Take 1 tablet (5 mg total) by mouth daily. 30 tablet 11  . clindamycin (CLEOCIN) 300 MG capsule Take 1 capsule (300 mg total) by mouth 4 (four) times daily. 40 capsule 0  . Darunavir Ethanolate (PREZISTA) 800 MG tablet Take 1 tablet (800 mg total) by mouth daily. 30 tablet 0  . diazepam (VALIUM) 2 MG tablet Take 1 tablet (2 mg total) by mouth at bedtime as needed for anxiety. 30 tablet 5  . HYDROcodone-acetaminophen (NORCO/VICODIN) 5-325 MG per tablet 1 to 2 tabs every 4 to 6 hours as needed for pain. 20 tablet 0  . mupirocin ointment (BACTROBAN) 2 % Apply 1 application topically 3 (three) times daily. 22 g 0  . ritonavir (NORVIR) 100 MG TABS tablet Take 1 tablet (100 mg total) by mouth daily. 30 tablet 5    Allergies: Allergies as of 02/20/2014  . (No Known Allergies)   Family History  Problem Relation Age of Onset  . Hypertension Mother    History   Social History  . Marital Status: Widowed    Spouse Name: N/A    Number of Children: N/A  . Years of Education: N/A   Occupational History  . Not on file.   Social History Main Topics  . Smoking status: Current Every Day Smoker -- 0.30 packs/day    Types: Cigarettes  . Smokeless tobacco: Never Used     Comment: 2 cigarettes a day  . Alcohol Use: No  . Drug Use: No     Comment: heroin  . Sexual Activity:    Partners: Male     Comment: pt. declined condoms   Other Topics Concern  . Not on file    Social History Narrative    Review of Systems: All pertinent ROS has stated in HPI.   Physical Exam: Blood pressure 107/81, pulse 71, temperature 98 F (36.7 C), temperature source Oral, resp. rate 20, height 5\' 3"  (1.6 m), weight 133 lb (60.328 kg), last menstrual period 09/08/2011, SpO2 99 %. General: resting in bed, no acute distress HEENT: PERRL, EOMI, no scleral icterus, otoscopic exam on the left remarkable for cerumen noted tympanic membrane without significant erythema or effusion, exam on the right limited by a polypoid lesion with central excoriation on the external aspect of the auditory canal which is exquisitely tender, visualization of tympanic membrane limited by the lesion, no significant mastoid tenderness, no frontal or maxillary sinus tenderness, no significant tenderness upon manipulation of pinna Cardiac: RRR, no rubs, murmurs or gallops Pulm: clear to auscultation bilaterally, moving normal volumes of air Abd: soft, nontender, nondistended, BS present Ext: warm and well perfused, right foot with warmth, erythema, and tenderness; swollen compared to left, no crepitus, pulses intact, space between the fourth and fifth digits with a shallow ulceration with purulent drainage (see below) Neuro: alert and oriented X3, cranial nerves II-XII grossly intact Skin: no rashes or lesions noted Psych: appropriate affect and cognition        Lab results: Basic Metabolic Panel:  Recent Labs  78/29/5609/06/05 2317  NA 136*  K 4.4  CL 99  CO2 25  GLUCOSE 99  BUN 12  CREATININE 0.79  CALCIUM 9.9   Liver Function Tests: No results for input(s): AST, ALT, ALKPHOS, BILITOT, PROT, ALBUMIN in the last 72 hours. No results for input(s): LIPASE, AMYLASE in the last 72 hours. No results for input(s): AMMONIA in the last 72 hours. CBC:  Recent Labs  02/20/14 2317  WBC 6.5  NEUTROABS 3.8  HGB 14.0  HCT 41.8  MCV 90.9  PLT 236   Cardiac Enzymes: No results for input(s):  CKTOTAL, CKMB, CKMBINDEX, TROPONINI in the last 72 hours. BNP: No results for input(s): PROBNP in the last 72 hours. D-Dimer: No results for input(s): DDIMER in the last 72 hours. CBG: No results for input(s): GLUCAP in the last 72 hours. Hemoglobin A1C: No results for input(s): HGBA1C in the last 72 hours. Fasting Lipid Panel: No results for input(s): CHOL, HDL, LDLCALC, TRIG, CHOLHDL, LDLDIRECT in the last 72 hours. Thyroid Function Tests: No results for input(s): TSH, T4TOTAL, FREET4, T3FREE, THYROIDAB in the last 72 hours. Anemia Panel: No results for input(s): VITAMINB12, FOLATE, FERRITIN, TIBC, IRON, RETICCTPCT in the last 72 hours. Coagulation: No results for input(s): LABPROT, INR in the last 72 hours. Urine Drug Screen: Drugs of Abuse     Component Value Date/Time   LABOPIA POSITIVE* 10/22/2012 1326  COCAINSCRNUR NONE DETECTED 10/22/2012 1326   LABBENZ NONE DETECTED 10/22/2012 1326   AMPHETMU NONE DETECTED 10/22/2012 1326   THCU POSITIVE* 10/22/2012 1326   LABBARB NONE DETECTED 10/22/2012 1326    Alcohol Level: No results for input(s): ETH in the last 72 hours. Urinalysis: No results for input(s): COLORURINE, LABSPEC, PHURINE, GLUCOSEU, HGBUR, BILIRUBINUR, KETONESUR, PROTEINUR, UROBILINOGEN, NITRITE, LEUKOCYTESUR in the last 72 hours.  Invalid input(s): APPERANCEUR  Imaging results:  Dg Foot Complete Right  02/21/2014   CLINICAL DATA:  Initial evaluation for spider bite on fifth digit. Swelling.  EXAM: RIGHT FOOT COMPLETE - 3+ VIEW  COMPARISON:  None.  FINDINGS: No acute fracture or dislocation. Joint spaces are maintained. 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 No significant swelling or other soft tissue abnormality identified about the foot. No radiopaque foreign body.  IMPRESSION: No acute abnormality about the right foot.   Electronically Signed   By: Rise Mu M.D.   On: 02/21/2014 00:12   Assessment & Plan by  Problem: Principal Problem:   Cellulitis Active Problems:   Human immunodeficiency virus (HIV) disease  Patient is a 53 year old female with a history of HIV (CD4 count 250, viral load 21 from October 2015), hypertension, heroin abuse, anxiety who presents with cellulitis status post failed treatment with oral clindamycin.  Purulent cellulitis of right foot: Patient presenting with erythema, warmth, swelling, and tenderness with purulence. Wound does not probe to bone. No exquisite tenderness, crepitus, bulla, or skin anesthesia. Afebrile with no leukocytosis. Plain film unremarkable for any signs of deep soft tissue infection or bone involvement. Studies on patients with HIV have demonstrated that MRSA is the most common cellulitis pathogen (approximately 50%), followed by Pseudomonas (approximately 8%) (Manfredi, R., Cornelius Moras and Chiodo, F. Journal of Cutaneous Pathology, 29: 168-172 and Infez Med. 2001 Jun;9(2):101-7). The antibiogram for the hospital identifies 75% susceptibility of MRSA to clindamycin, which was the patient's previous regimen.  - Blood culture ordered in the ED  - No further imaging warranted at this point given low suspicion for osteomyelitis.  - Continue vancomycin for gram-positive coverage given failure of clindamycin  - Start Zosyn given treatment failure and higher risk of pseudomonal infection in an immunocompromised patient.  Ear pain: Patient states that is a more internal pain. No significant tenderness upon manipulation of the external ear. No significant mastoid, frontal, or maxillary sinus tenderness. Possibly otitis media, though otoscopic exam was limited by polypoid lesion. Tenderness could also be secondary to the lesion itself, which has been pruritic and was quite tender on exam. An infectious process would be covered by her current regimen of antibiotics.  HIV: CD4 count 250 viral count of 21 from October 2015. Seen by Dr. Luciana Axe in [facility] clinic.   - Continue  triumeq (Abacavir-Dolutegravir-Lamivudine) 600-50-300 mg 1 tablet daily, prezista 800 mg daily, and norvir 100 mg daily.  Hypertension: patient normotensive upon admission  - Continue home amlodipine 5 mg daily  Insomnia/anxiety:  - continue home diazepam  Diet: Regular Prophylaxis: heparin Code: Full  Dispo: Disposition is deferred at this time, awaiting improvement of current medical problems. Anticipated discharge in approximately 2 day(s).   The patient does not have a current PCP (No Pcp Per Patient) and does need an [facility] hospital follow-up appointment after discharge.  The patient does not have transportation limitations that hinder transportation to clinic appointments.  Signed: Harold Barban, M.D., Ph.D. Internal Medicine Teaching Service, PGY-1 02/21/2014, 3:57 AM

## 2014-02-21 NOTE — Progress Notes (Signed)
ANTIBIOTIC CONSULT NOTE - INITIAL  Pharmacy Consult for Vancomycin/Zosyn  Indication: Cellulitis  No Known Allergies  Patient Measurements: Height: 5\' 3"  (160 cm) Weight: 133 lb (60.328 kg) IBW/kg (Calculated) : 52.4  Vital Signs: Temp: 98 F (36.7 C) (11/02 2225) Temp Source: Oral (11/02 2225) BP: 112/70 mmHg (11/03 0415) Pulse Rate: 58 (11/03 0415)  Labs:  Recent Labs  02/20/14 2317  WBC 6.5  HGB 14.0  PLT 236  CREATININE 0.79   Estimated Creatinine Clearance: 68.8 mL/min (by C-G formula based on Cr of 0.79).  Medical History: Past Medical History  Diagnosis Date  . Hypertension   . HIV infection   . Heroin abuse     Assessment: 53 y/o F to start vancomycin/zosyn for RLE cellulitis. WBC WNL, renal function ok, other labs as above.   Goal of Therapy:  Vancomycin trough level 10-15 mcg/ml  Plan:  -Vancomycin 750 mg IV q12h -Zosyn 3.375G IV q8h to be infused over 4 hours -Trend WBC, temp, renal function  -Drug levels as indicated   Abran DukeLedford, Jayln Madeira 02/21/2014,4:23 AM

## 2014-02-21 NOTE — Plan of Care (Signed)
Problem: Phase I Progression Outcomes Goal: OOB as tolerated unless otherwise ordered Outcome: Completed/Met Date Met:  02/21/14     

## 2014-02-21 NOTE — ED Notes (Signed)
Spoke to Dr. Mora Bellmanni in regards to pain. MD gives verbal order for 2 percocets.

## 2014-02-21 NOTE — ED Notes (Signed)
Patient is allowed to eat at this time.

## 2014-02-22 DIAGNOSIS — L989 Disorder of the skin and subcutaneous tissue, unspecified: Secondary | ICD-10-CM

## 2014-02-22 DIAGNOSIS — N179 Acute kidney failure, unspecified: Secondary | ICD-10-CM

## 2014-02-22 LAB — BASIC METABOLIC PANEL
ANION GAP: 12 (ref 5–15)
Anion gap: 11 (ref 5–15)
BUN: 27 mg/dL — ABNORMAL HIGH (ref 6–23)
BUN: 29 mg/dL — ABNORMAL HIGH (ref 6–23)
CALCIUM: 9.4 mg/dL (ref 8.4–10.5)
CO2: 25 mEq/L (ref 19–32)
CO2: 25 mEq/L (ref 19–32)
CREATININE: 2.45 mg/dL — AB (ref 0.50–1.10)
CREATININE: 2.76 mg/dL — AB (ref 0.50–1.10)
Calcium: 9.2 mg/dL (ref 8.4–10.5)
Chloride: 102 mEq/L (ref 96–112)
Chloride: 99 mEq/L (ref 96–112)
GFR calc Af Amer: 22 mL/min — ABNORMAL LOW (ref >90)
GFR calc non Af Amer: 22 mL/min — ABNORMAL LOW (ref >90)
GFR, EST AFRICAN AMERICAN: 25 mL/min — AB (ref >90)
GFR, EST NON AFRICAN AMERICAN: 19 mL/min — AB (ref >90)
GLUCOSE: 102 mg/dL — AB (ref 70–99)
Glucose, Bld: 97 mg/dL (ref 70–99)
Potassium: 4.4 mEq/L (ref 3.7–5.3)
Potassium: 4 mEq/L (ref 3.7–5.3)
SODIUM: 135 meq/L — AB (ref 137–147)
Sodium: 139 mEq/L (ref 137–147)

## 2014-02-22 LAB — DIFFERENTIAL
Basophils Absolute: 0 10*3/uL (ref 0.0–0.1)
Basophils Relative: 0 % (ref 0–1)
EOS PCT: 3 % (ref 0–5)
Eosinophils Absolute: 0.2 10*3/uL (ref 0.0–0.7)
LYMPHS PCT: 5 % — AB (ref 12–46)
Lymphs Abs: 0.3 10*3/uL — ABNORMAL LOW (ref 0.7–4.0)
Monocytes Absolute: 0.1 10*3/uL (ref 0.1–1.0)
Monocytes Relative: 2 % — ABNORMAL LOW (ref 3–12)
NEUTROS ABS: 4.9 10*3/uL (ref 1.7–7.7)
NEUTROS PCT: 90 % — AB (ref 43–77)

## 2014-02-22 LAB — CBC
HEMATOCRIT: 36.5 % (ref 36.0–46.0)
Hemoglobin: 12.1 g/dL (ref 12.0–15.0)
MCH: 30.2 pg (ref 26.0–34.0)
MCHC: 33.2 g/dL (ref 30.0–36.0)
MCV: 91 fL (ref 78.0–100.0)
Platelets: 199 10*3/uL (ref 150–400)
RBC: 4.01 MIL/uL (ref 3.87–5.11)
RDW: 12 % (ref 11.5–15.5)
WBC: 5.7 10*3/uL (ref 4.0–10.5)

## 2014-02-22 LAB — URINALYSIS, ROUTINE W REFLEX MICROSCOPIC
Bilirubin Urine: NEGATIVE
Glucose, UA: NEGATIVE mg/dL
Ketones, ur: 15 mg/dL — AB
Nitrite: NEGATIVE
PROTEIN: 30 mg/dL — AB
Specific Gravity, Urine: 1.028 (ref 1.005–1.030)
UROBILINOGEN UA: 0.2 mg/dL (ref 0.0–1.0)
pH: 5 (ref 5.0–8.0)

## 2014-02-22 LAB — URINE MICROSCOPIC-ADD ON

## 2014-02-22 LAB — CK: CK TOTAL: 69 U/L (ref 7–177)

## 2014-02-22 MED ORDER — SODIUM CHLORIDE 0.9 % IV SOLN
INTRAVENOUS | Status: DC
  Administered 2014-02-22: 20:00:00 via INTRAVENOUS

## 2014-02-22 MED ORDER — DOXYCYCLINE HYCLATE 100 MG PO TABS
100.0000 mg | ORAL_TABLET | Freq: Two times a day (BID) | ORAL | Status: DC
  Administered 2014-02-22 – 2014-02-25 (×7): 100 mg via ORAL
  Filled 2014-02-22 (×8): qty 1

## 2014-02-22 MED ORDER — HYDROCODONE-ACETAMINOPHEN 5-325 MG PO TABS: 1.0000 | ORAL_TABLET | ORAL | Status: DC | PRN

## 2014-02-22 MED ORDER — OXYCODONE-ACETAMINOPHEN 5-325 MG PO TABS
1.0000 | ORAL_TABLET | Freq: Four times a day (QID) | ORAL | Status: DC | PRN
  Administered 2014-02-22 – 2014-02-25 (×6): 2 via ORAL
  Filled 2014-02-22 (×6): qty 2

## 2014-02-22 MED ORDER — KETOROLAC TROMETHAMINE 30 MG/ML IJ SOLN
30.0000 mg | Freq: Once | INTRAMUSCULAR | Status: AC
  Administered 2014-02-22: 30 mg via INTRAVENOUS
  Filled 2014-02-22: qty 1

## 2014-02-22 MED ORDER — LAMIVUDINE 10 MG/ML PO SOLN
100.0000 mg | Freq: Every day | ORAL | Status: DC
  Administered 2014-02-23 – 2014-02-25 (×3): 100 mg via ORAL
  Filled 2014-02-22 (×3): qty 10

## 2014-02-22 MED ORDER — AMOXICILLIN 250 MG PO CAPS
250.0000 mg | ORAL_CAPSULE | Freq: Two times a day (BID) | ORAL | Status: DC
  Administered 2014-02-22 (×2): 250 mg via ORAL
  Filled 2014-02-22 (×6): qty 1

## 2014-02-22 MED ORDER — SODIUM CHLORIDE 0.9 % IV BOLUS (SEPSIS)
500.0000 mL | Freq: Once | INTRAVENOUS | Status: AC
  Administered 2014-02-22: 500 mL via INTRAVENOUS

## 2014-02-22 NOTE — Discharge Summary (Signed)
Name: Courtney Ellis MRN: [id number] DOB: 07/22/1960 53 y.o. PCP: No Pcp Per Patient  Date of Admission: 02/20/2014 10:51 PM Date of Discharge: 02/25/2014 Attending Physician: No att. providers found  Discharge Diagnosis: Principal Problem:   Cellulitis and abscess of foot Active Problems:   Human immunodeficiency virus (HIV) disease   Lesion of ear canal   AKI (acute kidney injury)  Discharge Medications:   Medication List    STOP taking these medications        Abacavir-Dolutegravir-Lamivud 600-50-300 MG Tabs     clindamycin 300 MG capsule  Commonly known as:  CLEOCIN     Darunavir Ethanolate 800 MG tablet  Commonly known as:  PREZISTA      TAKE these medications        amLODipine 5 MG tablet  Commonly known as:  NORVASC  Take 1 tablet (5 mg total) by mouth daily.     ciprofloxacin-dexamethasone otic suspension  Commonly known as:  CIPRODEX  Place 4 drops into the right ear 2 (two) times daily.     diazepam 2 MG tablet  Commonly known as:  VALIUM  Take 1 tablet (2 mg total) by mouth at bedtime as needed for anxiety.     doxycycline 100 MG tablet  Commonly known as:  VIBRA-TABS  Take 1 tablet (100 mg total) by mouth 2 (two) times daily.     HYDROcodone-acetaminophen 5-325 MG per tablet  Commonly known as:  NORCO/VICODIN  1 to 2 tabs every 4 to 6 hours as needed for pain.     mupirocin ointment 2 %  Commonly known as:  BACTROBAN  Apply 1 application topically 3 (three) times daily.     nicotine 7 mg/24hr patch  Commonly known as:  NICODERM CQ - dosed in mg/24 hr  Place 1 patch (7 mg total) onto the skin daily.     ritonavir 100 MG Tabs tablet  Commonly known as:  NORVIR  Take 1 tablet (100 mg total) by mouth daily.        Disposition and follow-up:   [name] [facility] St[facility].  At the hospital follow up visit please address:  1.  AKI: return of renal function to baseline  2.  HIV:  resuming HAART 2/2 resolution of AKI  3.  Cellulitis: completion of antibiotic therapy  4.  Labs / imaging needed at time of follow-up: BMET (standing order)  5.  Pending labs/ test needing follow-up: none  Follow-up Appointments: Follow-up Information    Follow up with [facility] On 02/25/2014.   Contact information:   [address] [place] [id number] [phone]      Discharge Instructions: Discharge Instructions    Call MD for:  redness, tenderness, or signs of infection (pain, swelling, redness, odor or green/yellow discharge around incision site)    Complete by:  As directed      Call MD for:  severe uncontrolled pain    Complete by:  As directed      Call MD for:  temperature >100.4    Complete by:  As directed      Diet - low sodium heart healthy    Complete by:  As directed      Increase activity slowly    Complete by:  As directed      Leave dressing on - Keep it clean, dry, and intact until clinic visit    Complete by:  As directed            Consultations:    Procedures Performed:  [place] [facility]  02/23/2014   CLINICAL DATA:  Acute renal failure, diabetes, history of HIV  EXAM: RENAL/URINARY TRACT ULTRASOUND COMPLETE  COMPARISON:  None.  FINDINGS: Right Kidney:  Length: 11.5 cm. Increased renal cortical echogenicity, trace fluid in the right renal pelvis without overt hydronephrosis. No focal mass.  Left Kidney:  Length: 12.7 cm. Increased renal cortical echogenicity without hydronephrosis or focal mass.  Bladder:  Appears normal for degree of bladder distention.  IMPRESSION: Increased renal cortical echogenicity bilaterally which may be associated with HIV nephropathy or some anti retro viral medications. No acute abnormality.   Electronically Signed   By: Christiana PellantGretchen  Green M.D.   On: 02/23/2014 13:38   Dg Foot Complete Right  02/21/2014   CLINICAL DATA:  Initial evaluation for spider bite on fifth digit. Swelling.  EXAM: RIGHT  FOOT COMPLETE - 3+ VIEW  COMPARISON:  None.  FINDINGS: No acute fracture or dislocation. Joint spaces are maintained. 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 No significant swelling or other soft tissue abnormality identified about the foot. No radiopaque foreign body.  IMPRESSION: No acute abnormality about the right foot.   Electronically Signed   By: Rise MuBenjamin  McClintock M.D.   On: 02/21/2014 00:12    Admission HPI:  Patient is a 53 year old female with a history of HIV (CD4 count 250, viral load 21 from October 2015), hypertension, heroin abuse, anxiety who presents with right foot swelling, erythema, and warmth. Patient states that she first noticed that her right foot was itching on 02/16/2014. She states that she took her sock off and noticed something that looked like a spider in the sock. 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 She has noticed a large number of spiders emerging from the cargo. Patient was then evaluated at urgent care on 02/18/2014 and was diagnosed with cellulitis and given a course of clindamycin. Patient reports that her foot did not improve and continued to worsen, becoming more swollen and starting to have purulent drainage by this morning. She states that the foot is also very tender extending well above her ankle.  Patient also reports some right-sided ear pain that has been going on for 1-2 weeks. She also reports having some cough during the same interval that is associated with rhinorrhea. Patient otherwise denies any fevers, chills, chest pain, dyspnea, vomiting, nausea, diarrhea, or constipation. Patient reports that she still smokes around 3 cigarettes a day, but does not drink alcohol and has not used heroin or other illicits in more than a year, since before her recent incarceration.  Upon evaluation in the emergency department this time, blood cultures were drawn and vancomycin was  initiated.   Hospital Course by problem list:   Cellulitis: She was initially started on vancomycin and Zosyn. Wound cultures were collected and were remarkable for MRSA sensitive to doxycycline. She was transitioned to oral doxycyline 100mg  BID and was tolerating this treatment at the time of discharge. Her wound improved with resolution of purulent drainage. She has been discharged on 5 days of medicine and will complete a total of 10 day antibiotic treatment.  AKI: Likely 2/2 adverse effect of medication (Toradol, vancomycin, Zosyn) in the setting of HIV-associated nephropathy and possibly hypotension. On 11/4, her creatinine increased form 0.8 to 2.5. Vancomycin and Zosyn were discontinued that day in favor of doxycyline  and amoxicillin to cover Staph and Strep. 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 Urine output was adequate (0.5-1.5L) per day. Nephrology was consulted and agreed with supportive management. Her creatinine peaked at 3.3 before trending down to 2.8 on the day of discharge. Her renal function will need to be checked at the time of follow-up to assess new baseline.  HIV: Her antiretrovirals were continued during her hospital stay but were adjusted for creatinine clearance once she developed AKI. As her creatinine had not normalized at the time of discharge, she was asked to hold her home meds. At the the time of follow-up, her creatinine clearance will need to be assessed to determine if she can resume her original regimen. If not, she will need to schedule follow-up with the ID clinic to get new dose within a week.   Lesion of ear canal: ENT was consulted for a lesion seen in the R ear, and they recommended Ciprodex drops. Her pain improved and will need to follow-up with ENT as an outpatient for further evaluation.   HTN: Remained stable on home medications.  Discharge Vitals:   BP 148/77 mmHg  Pulse 87  Temp(Src) 98.3 F (36.8 C) (Oral)  Resp 18  Ht 5\' 3"   (1.6 m)  Wt 131 lb 6.3 oz (59.6 kg)  BMI 23.28 kg/m2  SpO2 99%  LMP 09/08/2011  Discharge Labs:  Results for orders placed or performed during the hospital encounter of 02/20/14 (from the past 24 hour(s))  Renal function panel     Status: Abnormal   Collection Time: 02/25/14  4:12 AM  Result Value Ref Range   Sodium 138 137 - 147 mEq/L   Potassium 4.8 3.7 - 5.3 mEq/L   Chloride 105 96 - 112 mEq/L   CO2 21 19 - 32 mEq/L   Glucose, Bld 94 70 - 99 mg/dL   BUN 24 (H) 6 - 23 mg/dL   Creatinine, Ser 1.61 (H) 0.50 - 1.10 mg/dL   Calcium 9.6 8.4 - 09.6 mg/dL   Phosphorus 4.4 2.3 - 4.6 mg/dL   Albumin 2.6 (L) 3.5 - 5.2 g/dL   GFR calc non Af Amer 19 (L) >90 mL/min   GFR calc Af Amer 21 (L) >90 mL/min   Anion gap 12 5 - 15    Signed: Heywood Iles, MD 02/25/2014, 8:52 PM    Services Ordered on Discharge: None Equipment Ordered on Discharge: None

## 2014-02-22 NOTE — Clinical Documentation Improvement (Signed)
"  HIV disease" is documented throughout the chart. To help build a defendable medical record and comply with ICD-10 coding guidelines, please document the below in your next progress note (where applicable).  - Link any HIV disease related diseases (current or prior). - State when an HIV patient is admitted for a condition unrelated to HIV. - Please give a range of CD4 counts, less that 200,  to fully capture the documented diagnosis of HIV disease. "HIV disease" is documented throughout the chart. To help build a defendable medical record and comply with ICD-10 coding guidelines, please document the below in your next progress note (where applicable).    Thank you for your time with this!   Servando SnareSalena Imanuel Pruiett, RN Clinical Documentation Improvement Specialist ([phone]) [phone] / [phone]

## 2014-02-22 NOTE — Progress Notes (Signed)
Subjective: This AM, she feels her pain and swelling are markedly improved. She attributes this improvement to Toradol IV. Her ear pain has also improved with ear drops started yesterday.  Of note, BMET this AM showed Crt markedly above baseline.   Objective: Vital signs in last 24 hours: Filed Vitals:   02/21/14 0452 02/21/14 1432 02/21/14 2215 02/22/14 0511  BP: 131/75 125/69 126/68 93/57  Pulse: 63 72 60 71  Temp: 97.6 F (36.4 C) 97.6 F (36.4 C) 97.4 F (36.3 C) 98.9 F (37.2 C)  TempSrc: Oral  Oral Oral  Resp: 18  19 16   Height: 5\' 3"  (1.6 m)     Weight: 131 lb 6.3 oz (59.6 kg)     SpO2: 100% 100% 100% 100%   Weight change:   Intake/Output Summary (Last 24 hours) at 02/22/14 0800 Last data filed at 02/22/14 0542  Gross per 24 hour  Intake   1040 ml  Output      0 ml  Net   1040 ml   General: resting in bed, NAD HEENT: PERRL, EOMI, no scleral icterus Cardiac: RRR, no rubs, murmurs or gallops Pulm: clear to auscultation bilaterally Abd: soft, nontender, nondistended, BS present Ext: R foot with resolution of warmth, erythema, and tenderness from yesterday, space between the 4th and 5th digits with a shallow ulceration with trace purulent drainage Neuro: alert and oriented X3, cranial nerves II-XII grossly intact, strength and sensation to light touch equal in bilateral upper and lower extremities   Lab Results: Basic Metabolic Panel:  Recent Labs Lab 02/21/14 0543 02/22/14 0414  NA 138 139  K 3.8 4.4  CL 102 102  CO2 26 25  GLUCOSE 146* 97  BUN 15 27*  CREATININE 0.78 2.45*  CALCIUM 9.1 9.4   Liver Function Tests:  Recent Labs Lab 02/21/14 0543  AST 27  ALT 20  ALKPHOS 71  BILITOT 0.2*  PROT 7.3  ALBUMIN 2.8*   CBC:  Recent Labs Lab 02/20/14 2317 02/21/14 0543 02/22/14 0414  WBC 6.5 4.9 5.7  NEUTROABS 3.8  --   --   HGB 14.0 12.3 12.1  HCT 41.8 36.8 36.5  MCV 90.9 90.9 91.0  PLT 236 212 199   Urine Drug Screen: Drugs of Abuse       Component Value Date/Time   LABOPIA POSITIVE* 10/22/2012 1326   COCAINSCRNUR NONE DETECTED 10/22/2012 1326   LABBENZ NONE DETECTED 10/22/2012 1326   AMPHETMU NONE DETECTED 10/22/2012 1326   THCU POSITIVE* 10/22/2012 1326   LABBARB NONE DETECTED 10/22/2012 1326    Studies/Results: Dg Foot Complete Right  02/21/2014   CLINICAL DATA:  Initial evaluation for spider bite on fifth digit. Swelling.  EXAM: RIGHT FOOT COMPLETE - 3+ VIEW  COMPARISON:  None.  FINDINGS: No acute fracture or dislocation. Joint spaces are maintained. 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 No significant swelling or other soft tissue abnormality identified about the foot. No radiopaque foreign body.  IMPRESSION: No acute abnormality about the right foot.   Electronically Signed   By: Rise MuBenjamin  McClintock M.D.   On: 02/21/2014 00:12   Medications: I have reviewed the patient's current medications. Scheduled Meds: . abacavir  600 mg Oral Daily   And  . lamiVUDine  300 mg Oral Daily  . amLODipine  5 mg Oral Daily  . ciprofloxacin-dexamethasone  4 drop Right Ear BID  . Darunavir Ethanolate  800 mg Oral Q breakfast  . dolutegravir  50 mg Oral Daily  .  heparin  5,000 Units Subcutaneous 3 times per day  . piperacillin-tazobactam (ZOSYN)  IV  3.375 g Intravenous 3 times per day  . ritonavir  100 mg Oral Q breakfast  . sodium chloride  3 mL Intravenous Q12H  . vancomycin  750 mg Intravenous Q12H   Continuous Infusions:  PRN Meds:.acetaminophen **OR** acetaminophen, diazepam Assessment/Plan: Principal Problem:   Cellulitis Active Problems:   Human immunodeficiency virus (HIV) disease   Lesion of ear canal  Ms. Behlke is a 53 year old female with HIV (CD4 count 250, viral load 21 from October 2015), HTN, h/o heroin abuse, anxiety who presents with cellulitis and R ear canal lesion now with AKI.  Acute kidney injury: Crt 2.04, up from 0.78. Likely an adverse effect of vancomycin and  possibly her antiretrovirals.  -Discontinue vancomycin -Give 500cc NS + 150cc/hr NS -Hold Toradol for now and give Percocet 5/325 q6h prn -Repeat UA   Purulent cellulitis of right foot: Improved from yesterday which shows response to treatment. Preliminary Gram stain shows Gram+ cocci making Pseudomonas less likely. -Narrow to doxy & amoxicillin per pharmacy for renal adjustment (Abx Day 2)  R ear canal lesion:  Continue Ciprodex ear drops. -Follow-up with ENT as outpatient  HIV: Continue home meds.  HTN: Normotensive. Continue home meds.  Insomnia/anxiety: Continue home diazepam.  #FEN:  -Diet: Regular -NS @ 150cc/hr  #DVT prophylaxis: heparin 5000 units subcutaneous  #CODE STATUS: FULL CODE  Dispo: Disposition is deferred at this time, awaiting improvement of current medical problems.  Anticipated discharge in approximately 1-2 day(s).   The patient does have a current PCP (No Pcp Per Patient) and [facility].  The patient does not have transportation limitations that hinder transportation to clinic appointments.  .Services Needed at time of discharge: Y = Yes, Blank = No PT:   OT:   RN:   Equipment:   Other:     LOS: 2 days   Heywood Ilesushil Patel, MD 02/22/2014, 8:00 AM

## 2014-02-22 NOTE — Progress Notes (Addendum)
ANTIBIOTIC CONSULT NOTE - FOLLOW UP  Pharmacy Consult:  Vancomycin / Zosyn Indication:   Cellulitis  No Known Allergies  Patient Measurements: Height: _0  (160 cm) Weight: 131 lb 6.3 oz (59.6 kg) IBW/kg (Calculated) : 52.4  Vital Signs: Temp: 98.9 F (37.2 C) (11/04 0511) Temp Source: Oral (11/04 0511) BP: 93/57 mmHg (11/04 0511) Pulse Rate: 71 (11/04 0511) Intake/Output from previous day: 11/03 0701 - 11/04 0700 In: 1040 [P.O.:840; IV Piggyback:200] Out: -   Labs:  Recent Labs  02/20/14 2317 02/21/14 0543 02/22/14 0414 02/22/14 1250  WBC 6.5 4.9 5.7  --   HGB 14.0 12.3 12.1  --   PLT 236 212 199  --   CREATININE 0.79 0.78 2.45* 2.76*   Estimated Creatinine Clearance: 19.9 mL/min (by C-G formula based on Cr of 2.76). No results for input(s): VANCOTROUGH, VANCOPEAK, VANCORANDOM, GENTTROUGH, GENTPEAK, GENTRANDOM, TOBRATROUGH, TOBRAPEAK, TOBRARND, AMIKACINPEAK, AMIKACINTROU, AMIKACIN in the last 72 hours.   Microbiology: Recent Results (from the past 720 hour(s))  Culture, blood (routine x 2)     Status: None (Preliminary result)   Collection Time: 02/20/14 11:15 PM  Result Value Ref Range Status   Specimen Description BLOOD ARM RIGHT  Final   Special Requests BOTTLES DRAWN AEROBIC AND ANAEROBIC 5CC EA  Final   Culture  Setup Time   Final    02/21/2014 03:36 Performed at [facility]    Culture   Final           BLOOD CULTURE RECEIVED NO GROWTH TO DATE CULTURE WILL BE HELD FOR 5 DAYS BEFORE ISSUING A FINAL NEGATIVE REPORT Performed at [facility]    Report Status PENDING  Incomplete  Culture, blood (routine x 2)     Status: None (Preliminary result)   Collection Time: 02/20/14 11:29 PM  Result Value Ref Range Status   Specimen Description BLOOD FOREARM LEFT  Final   Special Requests BOTTLES DRAWN AEROBIC AND ANAEROBIC 5CC EA  Final   Culture  Setup Time   Final    02/21/2014 03:36 Performed at [facility]    Culture   Final         BLOOD CULTURE RECEIVED NO GROWTH TO DATE CULTURE WILL BE HELD FOR 5 DAYS BEFORE ISSUING A FINAL NEGATIVE REPORT Performed at [facility]    Report Status PENDING  Incomplete  Wound culture     Status: None (Preliminary result)   Collection Time: 02/21/14  1:20 PM  Result Value Ref Range Status   Specimen Description WOUND RIGHT FOOT  Final   Special Requests Immunocompromised  Final   Gram Stain   Final    MODERATE WBC PRESENT,BOTH PMN AND MONONUCLEAR RARE SQUAMOUS EPITHELIAL CELLS PRESENT RARE GRAM POSITIVE COCCI IN PAIRS Performed at [facility]    Culture   Final    Culture reincubated for better growth Performed at [facility]    Report Status PENDING  Incomplete      Assessment: 67 YOF admitted with right foot cellulitis and started on vancomycin and Zosyn.  Currently with AKI although patient is producing urine per RN (unsure of how much).  Patient is on multiple nephrotoxic drugs: vancomycin, ketorolac, Zosyn.  Vanc 11/3 >> Zosyn 11/3 >> Clinda PTA  11/2 BCx x2 - NGTD 11/2 wound cx - GPC on Gram staiin  +HIV, CD4 250 - meds: abacavir ([id number]*[id number] negative), darunavir, dolutegravir, lamivudine, ritonavir   Goal of Therapy:  Vancomycin trough level 10-15 mcg/ml  Plan:  - Hold vanc for now, random level in AM - Change Zosyn to 2.25gm IV Q8H - Change lamivudine to 143m PO daily - Monitor renal fxn closely, micro data    Courtney Ellis D. DMina Marble PharmD, BCPS Pager:  [phone]/07/2013, 1:53 PM    =============================   Addendum: - change abx to amoxicillin and doxycycline    Courtney Ellis D. DMina Marble PharmD, BCPS Pager:  [phone]/07/2013, 2:00 PM

## 2014-02-22 NOTE — Plan of Care (Signed)
Problem: Phase I Progression Outcomes Goal: Voiding-avoid urinary catheter unless indicated Outcome: Completed/Met Date Met:  02/22/14 Goal: Hemodynamically stable Outcome: Completed/Met Date Met:  02/22/14 Goal: Temperature < 102 Outcome: Completed/Met Date Met:  02/22/14 Goal: Wound assessment- dressing change as appropriate Outcome: Completed/Met Date Met:  02/22/14     

## 2014-02-23 ENCOUNTER — Inpatient Hospital Stay (HOSPITAL_COMMUNITY): Payer: Medicaid Other

## 2014-02-23 DIAGNOSIS — N179 Acute kidney failure, unspecified: Secondary | ICD-10-CM | POA: Diagnosis not present

## 2014-02-23 DIAGNOSIS — N10 Acute tubulo-interstitial nephritis: Secondary | ICD-10-CM

## 2014-02-23 DIAGNOSIS — A4901 Methicillin susceptible Staphylococcus aureus infection, unspecified site: Secondary | ICD-10-CM

## 2014-02-23 LAB — CBC WITH DIFFERENTIAL/PLATELET
Basophils Absolute: 0 10*3/uL (ref 0.0–0.1)
Basophils Relative: 0 % (ref 0–1)
EOS PCT: 5 % (ref 0–5)
Eosinophils Absolute: 0.2 10*3/uL (ref 0.0–0.7)
HEMATOCRIT: 33.9 % — AB (ref 36.0–46.0)
Hemoglobin: 11.2 g/dL — ABNORMAL LOW (ref 12.0–15.0)
LYMPHS PCT: 29 % (ref 12–46)
Lymphs Abs: 1 10*3/uL (ref 0.7–4.0)
MCH: 30.7 pg (ref 26.0–34.0)
MCHC: 33 g/dL (ref 30.0–36.0)
MCV: 92.9 fL (ref 78.0–100.0)
MONO ABS: 0.5 10*3/uL (ref 0.1–1.0)
Monocytes Relative: 13 % — ABNORMAL HIGH (ref 3–12)
NEUTROS ABS: 1.9 10*3/uL (ref 1.7–7.7)
Neutrophils Relative %: 53 % (ref 43–77)
Platelets: 158 10*3/uL (ref 150–400)
RBC: 3.65 MIL/uL — AB (ref 3.87–5.11)
RDW: 12.2 % (ref 11.5–15.5)
WBC: 3.5 10*3/uL — AB (ref 4.0–10.5)

## 2014-02-23 LAB — BASIC METABOLIC PANEL
Anion gap: 13 (ref 5–15)
BUN: 31 mg/dL — ABNORMAL HIGH (ref 6–23)
CO2: 20 meq/L (ref 19–32)
Calcium: 8.5 mg/dL (ref 8.4–10.5)
Chloride: 101 mEq/L (ref 96–112)
Creatinine, Ser: 3.32 mg/dL — ABNORMAL HIGH (ref 0.50–1.10)
GFR calc Af Amer: 17 mL/min — ABNORMAL LOW (ref >90)
GFR calc non Af Amer: 15 mL/min — ABNORMAL LOW (ref >90)
Glucose, Bld: 108 mg/dL — ABNORMAL HIGH (ref 70–99)
POTASSIUM: 4.2 meq/L (ref 3.7–5.3)
SODIUM: 134 meq/L — AB (ref 137–147)

## 2014-02-23 NOTE — Progress Notes (Signed)
Patient ID: Courtney Ellis, female   DOB: 02/14/1961, 53 y.o.   MRN: [id number] Medicine attending: Patient interviewed and examined today together with the medical resident team. There is been an acute change in her status. She was started on Zosyn plus vancomycin for a cellulitis and ulcerated area with purulent exudate between the toes of her right foot. She was having significant pain. She has a prior history of IV drug abuse. I elected to give her a trial of Toradol. She had very good pain relief with this drug. Unfortunately, there has now been a sudden decline in her kidney function with a rise in her creatinine from 0.8 on November 3 to 2.45 on November 4 with BUN 27. No improvement with a fluid challenge. BUN today 31 with creatinine 3.3. Findings consistent either with antibiotic toxicity (Vancomycin) or Toradol. Both medications were stopped yesterday. She has no underlying diabetes. She is on no other potentially nephrotoxic drugs. 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 Management is supportive. Strict input and output. Monitor electrolytes and renal function.watch for post-ATN diuresis if her renal function starts to improve. Problems with the kidneys felt to be medication related discussed with the patient.

## 2014-02-23 NOTE — Progress Notes (Signed)
Pt a/o, no c/o pain, pt slept throughout night, pt stable

## 2014-02-23 NOTE — Consult Note (Signed)
Reason for Consult: AKI Referring Physician: Dr. Cyndie Chime  HPI: Courtney Ellis is an 53 y.o. female with PMH of HIV and hypertension. Admitted on 02/20/2014 for cellulitis of right toe. Patient was started on vancomycin and zosyn with Toradol for pain. Patient is also on anti-retrovirals for HIV infection. Patient developed subsequent AKI which worsened after nephrotoxic agents discontinued. Patient also with UTI significant for pyuria and bacteruria with hematuria. She looks well, walking around room- making urine   Trend in Creatinine: CREAT  Date/Time Value Ref Range Status  02/06/2014 04:47 PM 0.72 0.50 - 1.10 mg/dL Final  16/01/9603 54:09 AM 0.91 0.50 - 1.10 mg/dL Final  81/19/1478 29:56 AM 0.85 0.50 - 1.10 mg/dL Final  21/30/8657 84:69 AM 0.88 0.50 - 1.10 mg/dL Final  62/95/2841 32:44 AM 0.97 0.50 - 1.10 mg/dL Final  04/23/7251 66:44 PM 0.79 0.50 - 1.10 mg/dL Final  03/47/4259 56:38 AM 0.77 0.50 - 1.10 mg/dL Final    Comment:         CREATININE, SER  Date/Time Value Ref Range Status  02/23/2014 03:47 AM 3.32* 0.50 - 1.10 mg/dL Final  75/64/3329 51:88 PM 2.76* 0.50 - 1.10 mg/dL Final  41/66/0630 16:01 AM 2.45* 0.50 - 1.10 mg/dL Final    Comment:    DELTA CHECK NOTED  02/21/2014 05:43 AM 0.78 0.50 - 1.10 mg/dL Final  09/32/3557 32:20 PM 0.79 0.50 - 1.10 mg/dL Final  25/42/7062 37:62 PM 0.80 0.50 - 1.10 mg/dL Final  83/15/1761 60:73 PM 0.85 0.50 - 1.10 mg/dL Final  71/09/2692 85:46 PM 0.83 0.40-1.20 mg/dL Final  27/06/5007 38:18 PM 0.98 0.40-1.20 mg/dL Final  29/93/7169 67:89 PM 1.07 (0.40-1.20 mg/dL Final  38/01/1750 02:58 PM 0.80 (0.40-1.20 mg/dL Final  52/77/8242 35:36 AM 0.86 0.4 - 1.2 mg/dL Final  14/43/1540 08:67 PM 0.78 0.40-1.20 mg/dL Final  61/95/0932 67:12 PM 0.96 0.40-1.20 mg/dL Final  45/80/9983 38:25 AM 1.11 0.40-1.20 mg/dL Final  05/39/7673 41:93 PM 0.89 0.40-1.20 mg/dL Final  79/05/4095 35:32 PM 0.85 0.40-1.20 mg/dL Final  99/24/2683 41:96 PM 1.24*  0.40-1.20 mg/dL Final  22/29/7989 21:19 AM 0.79 0.40-1.20 mg/dL Final  41/74/0814 48:18 PM 0.75 0.40-1.20 mg/dL Final    PMH:   Past Medical History  Diagnosis Date  . Hypertension   . HIV infection   . Heroin abuse     PSH:  History reviewed. No pertinent past surgical history.  Allergies: No Known Allergies  Medications:   Prior to Admission medications   Medication Sig Start Date End Date Taking? Authorizing Provider  Abacavir-Dolutegravir-Lamivud 600-50-300 MG TABS Take 1 tablet by mouth daily. 02/06/14  Yes Gardiner Barefoot, MD  amLODipine (NORVASC) 5 MG tablet Take 1 tablet (5 mg total) by mouth daily. 02/06/14  Yes Gardiner Barefoot, MD  clindamycin (CLEOCIN) 300 MG capsule Take 1 capsule (300 mg total) by mouth 4 (four) times daily. 02/18/14  Yes Reuben Likes, MD  Darunavir Ethanolate (PREZISTA) 800 MG tablet Take 1 tablet (800 mg total) by mouth daily. 02/06/14  Yes Gardiner Barefoot, MD  diazepam (VALIUM) 2 MG tablet Take 1 tablet (2 mg total) by mouth at bedtime as needed for anxiety. 02/06/14  Yes Gardiner Barefoot, MD  HYDROcodone-acetaminophen (NORCO/VICODIN) 5-325 MG per tablet 1 to 2 tabs every 4 to 6 hours as needed for pain. 02/18/14  Yes Reuben Likes, MD  mupirocin ointment (BACTROBAN) 2 % Apply 1 application topically 3 (three) times daily. 02/18/14  Yes Reuben Likes, MD  ritonavir (NORVIR) 100 MG TABS  tablet Take 1 tablet (100 mg total) by mouth daily. 02/06/14  Yes Gardiner Barefoot, MD    Inpatient medications: . abacavir  600 mg Oral Daily  . amLODipine  5 mg Oral Daily  . ciprofloxacin-dexamethasone  4 drop Right Ear BID  . Darunavir Ethanolate  800 mg Oral Q breakfast  . dolutegravir  50 mg Oral Daily  . doxycycline  100 mg Oral Q12H  . heparin  5,000 Units Subcutaneous 3 times per day  . lamiVUDine  100 mg Oral Daily  . ritonavir  100 mg Oral Q breakfast  . sodium chloride  3 mL Intravenous Q12H    Discontinued Meds:   Medications Discontinued During  This Encounter  Medication Reason  . Abacavir-Dolutegravir-Lamivud 600-50-300 MG TABS 1 tablet Formulary change  . abacavir-lamiVUDine (EPZICOM) 600-300 MG per tablet 1 tablet   . HYDROmorphone (DILAUDID) injection 0.5 mg   . HYDROcodone-acetaminophen (NORCO/VICODIN) 5-325 MG per tablet 1-2 tablet   . acetaminophen (TYLENOL) tablet 650 mg   . acetaminophen (TYLENOL) suppository 650 mg   . piperacillin-tazobactam (ZOSYN) IVPB 3.375 g   . vancomycin (VANCOCIN) IVPB 750 mg/150 ml premix   . lamiVUDine (EPIVIR) tablet 300 mg   . amoxicillin (AMOXIL) capsule 250 mg   . 0.9 %  sodium chloride infusion     Social History:  reports that she has been smoking Cigarettes.  She has been smoking about 0.30 packs per day. She has never used smokeless tobacco. She reports that she does not drink alcohol or use illicit drugs.  Family History:   Family History  Problem Relation Age of Onset  . Hypertension Mother     Pertinent items are noted in HPI. Weight change:   Intake/Output Summary (Last 24 hours) at 02/23/14 1336 Last data filed at 02/23/14 0617  Gross per 24 hour  Intake    480 ml  Output    500 ml  Net    -20 ml   BP 137/80 mmHg  Pulse 73  Temp(Src) 97.9 F (36.6 C) (Oral)  Resp 16  Ht 5\' 3"  (1.6 m)  Wt 131 lb 6.3 oz (59.6 kg)  BMI 23.28 kg/m2  SpO2 100%  LMP 09/08/2011 Filed Vitals:   02/22/14 2105 02/23/14 0614 02/23/14 1000 02/23/14 1302  BP: 131/69 119/71 134/82 137/80  Pulse: 75 64 69 73  Temp: 97.9 F (36.6 C) 97.7 F (36.5 C) 97.9 F (36.6 C) 97.9 F (36.6 C)  TempSrc: Oral Oral Oral Oral  Resp: 16 16 16 16   Height:      Weight:      SpO2: 95% 97% 100% 100%     Physical Exam Gen: well appearing female in no distress Chest: clear to auscultation bilaterally with no rales Heart: RRR, systolic murmur heard Abd: soft, non-tender, non-distended, no sacral edema Extremities: 1+ pitting edema on right with trace pitting on left- foot is bandaged on the  right  Labs: Basic Metabolic Panel:  Recent Labs Lab 02/20/14 2317 02/21/14 0543 02/22/14 0414 02/22/14 1250 02/23/14 0347  NA 136* 138 139 135* 134*  K 4.4 3.8 4.4 4.0 4.2  CL 99 102 102 99 101  CO2 25 26 25 25 20   GLUCOSE 99 146* 97 102* 108*  BUN 12 15 27* 29* 31*  CREATININE 0.79 0.78 2.45* 2.76* 3.32*  ALBUMIN  --  2.8*  --   --   --   CALCIUM 9.9 9.1 9.4 9.2 8.5   Liver Function Tests:  Recent Labs Lab  02/21/14 0543  AST 27  ALT 20  ALKPHOS 71  BILITOT 0.2*  PROT 7.3  ALBUMIN 2.8*   No results for input(s): LIPASE, AMYLASE in the last 168 hours. No results for input(s): AMMONIA in the last 168 hours. CBC:  Recent Labs Lab 02/20/14 2317 02/21/14 0543 02/22/14 0414 02/23/14 0347  WBC 6.5 4.9 5.7 3.5*  NEUTROABS 3.8  --  4.9 1.9  HGB 14.0 12.3 12.1 11.2*  HCT 41.8 36.8 36.5 33.9*  MCV 90.9 90.9 91.0 92.9  PLT 236 212 199 158    Recent Labs Lab 02/22/14 1250  CKTOTAL 69   CBG: No results for input(s): GLUCAP in the last 168 hours.  Iron Studies: No results for input(s): IRON, TIBC, TRANSFERRIN, FERRITIN in the last 168 hours.  Xrays/Other Studies: [facility]  02/23/2014   CLINICAL DATA:  Acute renal failure, diabetes, history of HIV  EXAM: RENAL/URINARY TRACT ULTRASOUND COMPLETE  COMPARISON:  None.  FINDINGS: Right Kidney:  Length: 11.5 cm. Increased renal cortical echogenicity, trace fluid in the right renal pelvis without overt hydronephrosis. No focal mass.  Left Kidney:  Length: 12.7 cm. Increased renal cortical echogenicity without hydronephrosis or focal mass.  Bladder:  Appears normal for degree of bladder distention.  IMPRESSION: Increased renal cortical echogenicity bilaterally which may be associated with HIV nephropathy or some anti retro viral medications. No acute abnormality.   Electronically Signed   By: Christiana PellantGretchen  Green M.D.   On: 02/23/2014 13:38   Background: Patient is a 53 yo female with a history of HIV and hypertension that was  initially treated with vancomycin/zosyn and received Toradol for pain. Creatinine on admission was 0.79 with an initial increase to 2.45 on hospital day #2 (11/4) and a peak of 3.32 on 11/5. Nephrotoxic agents discontinued on 11/4. Renal ultrasound significant for bilateral echogenicity. Patient appears euvolemic. Plan to watch creatinine.  Assessment/Plan: 1. AKI: baseline creatinine 0.79 on admission (11/2). Increased in the past two days to 2.45>>2.76>>3.32 today. UOP of 500ml. Nephrotoxic exposure includes vancomycin (11/3>>11/4), Toradol (11/4), Zosyn (11/3>>11/4). Hemoglobin slightly lowered to 11.2 today. Renal ultrasound significant for echogenicity. Suspect combined insult from hypoperfusion along with exposure to nephrotoxic agents. Agents discontinued. Hopefully will see decline in creatinine. For now, will monitor UOP (currently not oliguric) and fluid status. Renal function panel in AM. 2. Hypertension: on amlodipine 5mg , currently controlled. Will watch for hypotension. Management per primary team. 3. HIV: on lamivudine, abacavir, dolutegravir, and tironavir. Last CD4 count of 250 on 10/19. Last viral load of 21 on 10/19. Although these agents can cause kidney injury, doubt that they are related to recent AKI since patient arrived with normal creatinine. Recommend continuing antiviral treatment. Will watch kidney function as above. 4. Cellulitis: Improving on doxycycline. Management per primary 5. Abnormal urinalysis: patient's urinalysis suggest of infection vs effects from AIN. Patient with no symptoms, so currently not being treated. Will obtain urine culture. Do not suspect a UTI would be the cause of her current AKI, though.  Jacquelin Hawkingalph Nettey, MD PGY-2, [facility] 02/23/2014, 3:48 PM   Patient seen and examined, agree with above note with above modifications. Pleasant 53 year old BF with HIV admitted for cellulitis of toe.  Had normal renal function upon admit.  Received  zosyn/vanc and toradol with borderline blood pressures, then developed severe AKI-creatinine in the mid 2's now low 3's today.  She is not uremic- making urine- ultrasound negative for obstruction.  I feel her AKI is multifactorial (hypotension/infection/?poss AIN  with pyuria and NSAIDS) Offending agents have been discontinued- will send urine for culture and treat as needed.  Hopefully renal function will correct as quickly as it became abnormal  Annie SableKellie Sheyla Zaffino, MD 02/23/2014

## 2014-02-23 NOTE — Progress Notes (Signed)
Subjective: Crt continues to rise this AM, now 3.32. UA yesterday showed WBCs. Wound culture now shows Staph aureus.  This AM, she has no complaints and acknowledges that the medications she received on admission have likely caused her kidney function to deteriorate. She denies any issues with NSAIDs in the past though does not use them that frequently. She agrees to remain in the hospital until things improve.   Objective: Vital signs in last 24 hours: Filed Vitals:   02/22/14 0511 02/22/14 1401 02/22/14 2105 02/23/14 0614  BP: 93/57 110/67 131/69 119/71  Pulse: 71 72 75 64  Temp: 98.9 F (37.2 C) 98.3 F (36.8 C) 97.9 F (36.6 C) 97.7 F (36.5 C)  TempSrc: Oral Oral Oral Oral  Resp: 16 16 16 16   Height:      Weight:      SpO2: 100% 100% 95% 97%   Weight change:   Intake/Output Summary (Last 24 hours) at 02/23/14 0737 Last data filed at 02/23/14 0617  Gross per 24 hour  Intake    960 ml  Output    500 ml  Net    460 ml   General: resting in bed, NAD HEENT: PERRL, EOMI, no scleral icterus Cardiac: RRR, no rubs, murmurs or gallops Pulm: clear to auscultation bilaterally Abd: soft, nontender, nondistended, BS present Ext: R foot without warmth, erythema, and tenderness, space between the 4th and 5th digits with a shallow ulceration with trace purulent drainage Neuro: alert and oriented X3, cranial nerves II-XII grossly intact, strength and sensation to light touch equal in bilateral upper and lower extremities   Lab Results: Basic Metabolic Panel:  Recent Labs Lab 02/22/14 1250 02/23/14 0347  NA 135* 134*  K 4.0 4.2  CL 99 101  CO2 25 20  GLUCOSE 102* 108*  BUN 29* 31*  CREATININE 2.76* 3.32*  CALCIUM 9.2 8.5   Liver Function Tests:  Recent Labs Lab 02/21/14 0543  AST 27  ALT 20  ALKPHOS 71  BILITOT 0.2*  PROT 7.3  ALBUMIN 2.8*   CBC:  Recent Labs Lab 02/22/14 0414 02/23/14 0347  WBC 5.7 3.5*  NEUTROABS 4.9 1.9  HGB 12.1 11.2*  HCT 36.5  33.9*  MCV 91.0 92.9  PLT 199 158   Urine Drug Screen: Drugs of Abuse     Component Value Date/Time   LABOPIA POSITIVE* 10/22/2012 1326   COCAINSCRNUR NONE DETECTED 10/22/2012 1326   LABBENZ NONE DETECTED 10/22/2012 1326   AMPHETMU NONE DETECTED 10/22/2012 1326   THCU POSITIVE* 10/22/2012 1326   LABBARB NONE DETECTED 10/22/2012 1326    Studies/Results: No results found. Medications: I have reviewed the patient's current medications. Scheduled Meds: . abacavir  600 mg Oral Daily  . amLODipine  5 mg Oral Daily  . amoxicillin  250 mg Oral Q12H  . ciprofloxacin-dexamethasone  4 drop Right Ear BID  . Darunavir Ethanolate  800 mg Oral Q breakfast  . dolutegravir  50 mg Oral Daily  . doxycycline  100 mg Oral Q12H  . heparin  5,000 Units Subcutaneous 3 times per day  . lamiVUDine  100 mg Oral Daily  . ritonavir  100 mg Oral Q breakfast  . sodium chloride  3 mL Intravenous Q12H   Continuous Infusions: . sodium chloride 125 mL/hr at 02/22/14 1939   PRN Meds:.diazepam, oxyCODONE-acetaminophen Assessment/Plan: Principal Problem:   Cellulitis Active Problems:   Human immunodeficiency virus (HIV) disease   Lesion of ear canal   AKI (acute kidney injury)   AIN (  acute interstitial nephritis)  [name] is a 53 year old female with HIV (CD4 count 250, viral load 21 from October 2015), HTN, h/o heroin abuse, anxiety who presents with cellulitis and R ear canal lesion now with AKI likely 2/2 AIN.  Acute interstitial nephritis: Crt 3.3, up from 2.8 yesterday. Likely an adverse effect of vancomycin and Toradol. UA remarkable for WBCs though no eosinophilia seen on differential today. UO 500cc yesterday though inaccurate as she was not saving all her urine to calculated. -Encourage her to take PO intake and discontinue IV fluids -Encourage her to save urine for strict I&Os -Continue Percocet 5/325 q6h prn for pain control -Discontinue amoxicillin given culture results -Recheck  BMET -Consulted Nephrology   Purulent cellulitis of right foot: Continue doxycycline 100mg  BID. (Abx Day 3)  R ear canal lesion:  Continue Ciprodex ear drops. -Follow-up with ENT as outpatient  HIV: Continue home meds.  HTN: Normotensive. Continue home amlodipine.  Insomnia/anxiety: Continue home diazepam.  #FEN:  -Diet: Regular  #DVT prophylaxis: heparin 5000 units subcutaneous  #CODE STATUS: FULL CODE  Dispo: Disposition is deferred at this time, awaiting improvement of current medical problems.  Anticipated discharge in approximately 1-2 day(s).   The patient does have a current PCP (No Pcp Per Patient) and [facility] not [facility] after discharge.  The patient does not have transportation limitations that hinder transportation to clinic appointments.  .Services Needed at time of discharge: Y = Yes, Blank = No PT:   OT:   RN:   Equipment:   Other:     LOS: 3 days   Courtney Ilesushil Patel, MD 02/23/2014, 7:37 AM

## 2014-02-24 DIAGNOSIS — N289 Disorder of kidney and ureter, unspecified: Secondary | ICD-10-CM

## 2014-02-24 DIAGNOSIS — L02619 Cutaneous abscess of unspecified foot: Secondary | ICD-10-CM | POA: Diagnosis present

## 2014-02-24 DIAGNOSIS — B9562 Methicillin resistant Staphylococcus aureus infection as the cause of diseases classified elsewhere: Secondary | ICD-10-CM

## 2014-02-24 DIAGNOSIS — L03119 Cellulitis of unspecified part of limb: Secondary | ICD-10-CM

## 2014-02-24 LAB — WOUND CULTURE

## 2014-02-24 LAB — BASIC METABOLIC PANEL
Anion gap: 11 (ref 5–15)
BUN: 26 mg/dL — ABNORMAL HIGH (ref 6–23)
CALCIUM: 9 mg/dL (ref 8.4–10.5)
CO2: 22 mEq/L (ref 19–32)
Chloride: 107 mEq/L (ref 96–112)
Creatinine, Ser: 3.31 mg/dL — ABNORMAL HIGH (ref 0.50–1.10)
GFR, EST AFRICAN AMERICAN: 17 mL/min — AB (ref >90)
GFR, EST NON AFRICAN AMERICAN: 15 mL/min — AB (ref >90)
Glucose, Bld: 111 mg/dL — ABNORMAL HIGH (ref 70–99)
Potassium: 4.5 mEq/L (ref 3.7–5.3)
Sodium: 140 mEq/L (ref 137–147)

## 2014-02-24 LAB — PROTEIN / CREATININE RATIO, URINE
Creatinine, Urine: 90.69 mg/dL
Protein Creatinine Ratio: 0.07 (ref 0.00–0.15)
Total Protein, Urine: 6.3 mg/dL

## 2014-02-24 MED ORDER — NICOTINE 7 MG/24HR TD PT24
7.0000 mg | MEDICATED_PATCH | Freq: Every day | TRANSDERMAL | Status: DC
  Administered 2014-02-24 – 2014-02-25 (×2): 7 mg via TRANSDERMAL
  Filled 2014-02-24 (×2): qty 1

## 2014-02-24 NOTE — Progress Notes (Signed)
Subjective: Crt stable this AM at 3.3. Renal US showed findings consistent with nephropathy 2/2 HIV or antiretrovirals. Cultures grew MRSA.  This AM, she has no complaints. She had questions about where she may have gotten the MRSA, and I explained that it is just a variant of a normal skin bacteria that some folks end up growing and causes issues when it gets into areas of the body where it should not.   Per RN, she has been leaving the hospital to go smoke cigarettes. Patient confirms this and states that she smokes 2 cigarettes a day. She is agreeable to a low-dose nicotine patch and notes that she only recently started smoking again.  Objective: Vital signs in last 24 hours: Filed Vitals:   02/23/14 0614 02/23/14 1000 02/23/14 1302 02/23/14 2122  BP: 119/71 134/82 137/80 127/91  Pulse: 64 69 73 72  Temp: 97.7 F (36.5 C) 97.9 F (36.6 C) 97.9 F (36.6 C) 97.4 F (36.3 C)  TempSrc: Oral Oral Oral Axillary  Resp: 16 16 16 17   Height:      Weight:      SpO2: 97% 100% 100% 100%   Weight change:   Intake/Output Summary (Last 24 hours) at 02/24/14 0657 Last data filed at 02/24/14 0600  Gross per 24 hour  Intake   1480 ml  Output    550 ml  Net    930 ml   General: resting in bed, NAD HEENT: PERRL, EOMI, no scleral icterus Cardiac: RRR, no rubs, murmurs or gallops Pulm: clear to auscultation bilaterally Abd: soft, nontender, nondistended, BS present Ext: R foot wrapped in bandage, space between the 4th and 5th digits with a shallow ulceration without drainage, appears moist Neuro: alert and oriented X3, cranial nerves II-XII grossly intact, strength and sensation to light touch equal in bilateral upper and lower extremities   Lab Results: Basic Metabolic Panel:  Recent Labs Lab 02/23/14 0347 02/24/14 0416  NA 134* 140  K 4.2 4.5  CL 101 107  CO2 20 22  GLUCOSE 108* 111*  BUN 31* 26*  CREATININE 3.32* 3.31*  CALCIUM 8.5 9.0   Liver Function Tests:  Recent  Labs Lab 02/21/14 0543  AST 27  ALT 20  ALKPHOS 71  BILITOT 0.2*  PROT 7.3  ALBUMIN 2.8*   CBC:  Recent Labs Lab 02/22/14 0414 02/23/14 0347  WBC 5.7 3.5*  NEUTROABS 4.9 1.9  HGB 12.1 11.2*  HCT 36.5 33.9*  MCV 91.0 92.9  PLT 199 158   Urine Drug Screen: Drugs of Abuse     Component Value Date/Time   LABOPIA POSITIVE* 10/22/2012 1326   COCAINSCRNUR NONE DETECTED 10/22/2012 1326   LABBENZ NONE DETECTED 10/22/2012 1326   AMPHETMU NONE DETECTED 10/22/2012 1326   THCU POSITIVE* 10/22/2012 1326   LABBARB NONE DETECTED 10/22/2012 1326    Studies/Results: [facility]  02/23/2014   CLINICAL DATA:  Acute renal failure, diabetes, history of HIV  EXAM: RENAL/URINARY TRACT ULTRASOUND COMPLETE  COMPARISON:  None.  FINDINGS: Right Kidney:  Length: 11.5 cm. Increased renal cortical echogenicity, trace fluid in the right renal pelvis without overt hydronephrosis. No focal mass.  Left Kidney:  Length: 12.7 cm. Increased renal cortical echogenicity without hydronephrosis or focal mass.  Bladder:  Appears normal for degree of bladder distention.  IMPRESSION: Increased renal cortical echogenicity bilaterally which may be associated with HIV nephropathy or some anti retro viral medications. No acute abnormality.   Electronically Signed   By: Lucio EdwardGretchen  Green M.D.  On: 02/23/2014 13:38   Medications: I have reviewed the patient's current medications. Scheduled Meds: . abacavir  600 mg Oral Daily  . amLODipine  5 mg Oral Daily  . ciprofloxacin-dexamethasone  4 drop Right Ear BID  . Darunavir Ethanolate  800 mg Oral Q breakfast  . dolutegravir  50 mg Oral Daily  . doxycycline  100 mg Oral Q12H  . heparin  5,000 Units Subcutaneous 3 times per day  . lamiVUDine  100 mg Oral Daily  . ritonavir  100 mg Oral Q breakfast  . sodium chloride  3 mL Intravenous Q12H   Continuous Infusions:   PRN Meds:.diazepam, oxyCODONE-acetaminophen Assessment/Plan: Principal Problem:   Cellulitis Active  Problems:   Human immunodeficiency virus (HIV) disease   Lesion of ear canal   AKI (acute kidney injury)  Courtney Ellis is a 53 year old female with HIV (CD4 count 250, viral load 21 from October 2015), HTN, h/o heroin abuse, anxiety who presents with cellulitis and R ear canal lesion now with AKI likely multifactorial.  AKI: Crt stable from yesterday. Likely an adverse effect of vancomycin and Toradol in the setting of HIV therapy and possibly hypotension. UO 550cc yesterday, mildly improved from 500cc yesterday. -Encourage her to take PO intake and discontinue IV fluids -Encourage her to save urine for strict I&Os -Continue Percocet 5/325 q6h prn for pain control -Discontinue amoxicillin given culture results -Recheck BMET -Consulted Nephrology   Purulent cellulitis of right foot: Continue doxycycline 100mg  BID. (Abx Day 4)  R ear canal lesion:  Continue Ciprodex ear drops. -Follow-up with ENT as outpatient  HIV: Continue home meds.  HTN: Normotensive. Continue home amlodipine.  Insomnia/anxiety: Continue home diazepam.  #FEN:  -Diet: Regular  #DVT prophylaxis: heparin 5000 units subcutaneous  #CODE STATUS: FULL CODE  Dispo: Disposition is deferred at this time, awaiting stability in her renal function.  The patient does have a current PCP (No Pcp Per Patient) and [facility] after discharge.  The patient does not have transportation limitations that hinder transportation to clinic appointments.  .Services Needed at time of discharge: Y = Yes, Blank = No PT:   OT:   RN:   Equipment:   Other:     LOS: 4 days   Heywood Ilesushil Gayland Nicol, MD 02/24/2014, 6:57 AM

## 2014-02-24 NOTE — Plan of Care (Signed)
Problem: Phase III Progression Outcomes Goal: Activity at appropriate level-compared to baseline (UP IN CHAIR FOR HEMODIALYSIS)  Outcome: Completed/Met Date Met:  02/24/14 Goal: Temperature < 100 Outcome: Completed/Met Date Met:  02/24/14

## 2014-02-24 NOTE — Progress Notes (Signed)
Patient ID: Courtney Ellis, female    DOB: 02/10/1961, 53 y.o.   MRN: [id number] Nephrology Progress Note  S: Patient with no complaints today. She has had increased urine output. No shortness of breath. She is hopeful that she will get better soon so she can leave.  O:BP 127/91 mmHg  Pulse 72  Temp(Src) 97.4 F (36.3 C) (Axillary)  Resp 17  Ht 5\' 3"  (1.6 m)  Wt 131 lb 6.3 oz (59.6 kg)  BMI 23.28 kg/m2  SpO2 100%  LMP 09/08/2011  Intake/Output Summary (Last 24 hours) at 02/24/14 [id number] Last data filed at 02/24/14 0600  Gross per 24 hour  Intake   1480 ml  Output    550 ml  Net    930 ml   Intake/Output: I/O last 3 completed shifts: In: 2320 [P.O.:1320; I.V.:1000] Out: 1050 [Urine:1050]  Intake/Output this shift:  Total I/O In: 120 [P.O.:120] Out: -  Weight change:  HYQ:MVHQGen:Well appearing in no distress ION:GEXBMWUCVS:Regular rate and rhythm, systolic murmur heard Resp:Clear to auscultation bilaterally XLK:GMWNAbd:Soft, non-tender, non-distended Ext:1+ pitting edema on right compared to trace on left up to knee   Recent Labs Lab 02/20/14 2317 02/21/14 0543 02/22/14 0414 02/22/14 1250 02/23/14 0347 02/24/14 0416  NA 136* 138 139 135* 134* 140  K 4.4 3.8 4.4 4.0 4.2 4.5  CL 99 102 102 99 101 107  CO2 25 26 25 25 20 22   GLUCOSE 99 146* 97 102* 108* 111*  BUN 12 15 27* 29* 31* 26*  CREATININE 0.79 0.78 2.45* 2.76* 3.32* 3.31*  ALBUMIN  --  2.8*  --   --   --   --   CALCIUM 9.9 9.1 9.4 9.2 8.5 9.0  AST  --  27  --   --   --   --   ALT  --  20  --   --   --   --    Liver Function Tests:  Recent Labs Lab 02/21/14 0543  AST 27  ALT 20  ALKPHOS 71  BILITOT 0.2*  PROT 7.3  ALBUMIN 2.8*   No results for input(s): LIPASE, AMYLASE in the last 168 hours. No results for input(s): AMMONIA in the last 168 hours. CBC:  Recent Labs Lab 02/20/14 2317 02/21/14 0543 02/22/14 0414 02/23/14 0347  WBC 6.5 4.9 5.7 3.5*  NEUTROABS 3.8  --  4.9 1.9  HGB 14.0 12.3 12.1 11.2*  HCT 41.8 36.8  36.5 33.9*  MCV 90.9 90.9 91.0 92.9  PLT 236 212 199 158   Cardiac Enzymes:  Recent Labs Lab 02/22/14 1250  CKTOTAL 69   CBG: No results for input(s): GLUCAP in the last 168 hours.  Iron Studies: No results for input(s): IRON, TIBC, TRANSFERRIN, FERRITIN in the last 72 hours. Studies/Results: [place] Renal  02/23/2014   CLINICAL DATA:  Acute renal failure, diabetes, history of HIV  EXAM: RENAL/URINARY TRACT ULTRASOUND COMPLETE  COMPARISON:  None.  FINDINGS: Right Kidney:  Length: 11.5 cm. Increased renal cortical echogenicity, trace fluid in the right renal pelvis without overt hydronephrosis. No focal mass.  Left Kidney:  Length: 12.7 cm. Increased renal cortical echogenicity without hydronephrosis or focal mass.  Bladder:  Appears normal for degree of bladder distention.  IMPRESSION: Increased renal cortical echogenicity bilaterally which may be associated with HIV nephropathy or some anti retro viral medications. No acute abnormality.   Electronically Signed   By: Christiana PellantGretchen  Green M.D.   On: 02/23/2014 13:38   . abacavir  600 mg Oral  Daily  . amLODipine  5 mg Oral Daily  . ciprofloxacin-dexamethasone  4 drop Right Ear BID  . Darunavir Ethanolate  800 mg Oral Q breakfast  . dolutegravir  50 mg Oral Daily  . doxycycline  100 mg Oral Q12H  . heparin  5,000 Units Subcutaneous 3 times per day  . lamiVUDine  100 mg Oral Daily  . ritonavir  100 mg Oral Q breakfast  . sodium chloride  3 mL Intravenous Q12H    BMET    Component Value Date/Time   NA 140 02/24/2014 0416   K 4.5 02/24/2014 0416   CL 107 02/24/2014 0416   CO2 22 02/24/2014 0416   GLUCOSE 111* 02/24/2014 0416   BUN 26* 02/24/2014 0416   CREATININE 3.31* 02/24/2014 0416   CREATININE 0.72 02/06/2014 1647   CALCIUM 9.0 02/24/2014 0416   GFRNONAA 15* 02/24/2014 0416   GFRNONAA >89 02/06/2014 1647   GFRAA 17* 02/24/2014 0416   GFRAA >89 02/06/2014 1647   CBC    Component Value Date/Time   WBC 3.5* 02/23/2014 0347   RBC  3.65* 02/23/2014 0347   HGB 11.2* 02/23/2014 0347   HCT 33.9* 02/23/2014 0347   PLT 158 02/23/2014 0347   MCV 92.9 02/23/2014 0347   MCH 30.7 02/23/2014 0347   MCHC 33.0 02/23/2014 0347   RDW 12.2 02/23/2014 0347   LYMPHSABS 1.0 02/23/2014 0347   MONOABS 0.5 02/23/2014 0347   EOSABS 0.2 02/23/2014 0347   BASOSABS 0.0 02/23/2014 0347    Background: Patient is a 53 yo female with a history of HIV and hypertension, admitted with cellulitis of the right LE after a purported spider bite, with cultures subsequently + for MRSA who was initially treated with vancomycin/zosyn and received Toradol for pain. Creatinine on admission was 0.79 with an initial increase to 2.45 on hospital day #2 (11/4) and a peak of 3.32 on 11/5. Nephrotoxic agents discontinued on 11/4. Renal ultrasound significant for bilateral echogenicity. Patient appears euvolemic. Creatinine with negligible improvement so far but does appear to have reached a plateau as of 11/6.  Assessment/Plan: 1. AKI: baseline creatinine 0.79 on admission (11/2). Increased in the past two days to 2.45>>2.76>>3.32. Down to 3.31 today. UOP of 1.35L in last 24 hours. Nephrotoxic exposure includes vancomycin (11/3>>11/4), Toradol (11/4), Zosyn (11/3>>11/4). Renal ultrasound significant for echogenicity. Suspect combined insult from hypoperfusion (with a couple of low blood pressures in the 90's) along with exposure to nephrotoxic agents. Agents discontinued. Hopefully will see decline in creatinine. For now, will monitor UOP (currently not oliguric) and fluid status. Recheck renal function panel in AM. 2. Hypertension: on amlodipine 5mg , currently controlled. Will watch for hypotension. Management per primary team. 3. HIV: on lamivudine, abacavir, dolutegravir, and tironavir. Last CD4 count of 250 on 10/19. Last viral load of 21 on 10/19. Although these agents can cause kidney injury, doubt that they are related to recent AKI since patient arrived with  normal creatinine. Recommend continuing antiviral treatment. Will watch kidney function as above. 4. Cellulitis: Improving on doxycycline.Culture + for MRSA.  Management per primary 5. Abnormal urinalysis: patient's urinalysis suggest of infection vs effects from AIN. Patient with no symptoms, so currently not being treated. Urine culture pending. Do not suspect a UTI would be the cause of her current AKI, though.  Jacquelin Hawking, MD PGY-2, [facility] 02/24/2014, 8:24 AM   I have seen and examined this patient and agree with plan and assessment in the note of Dr. Caleb Popp.  Renal  function appears to be reaching a plateau and she remains non-oliguric. Potentially offending meds have been discontinued.    Interesting to me that in 2014 (?? Prior to initiation of TMT for HIV) she had >300 mg% protein on dipstick, and have to wonder if had brewing HIVAN).  Current US 30 mg% protein. For interest will check protein:creatinine ratio.  Echodense kidneys c/w HIV. Will follow.   Caelin Rayl B,MD 02/24/2014 11:53 AM

## 2014-02-25 ENCOUNTER — Encounter (HOSPITAL_COMMUNITY): Payer: Self-pay | Admitting: *Deleted

## 2014-02-25 LAB — RENAL FUNCTION PANEL
Albumin: 2.6 g/dL — ABNORMAL LOW (ref 3.5–5.2)
Anion gap: 12 (ref 5–15)
BUN: 24 mg/dL — ABNORMAL HIGH (ref 6–23)
CALCIUM: 9.6 mg/dL (ref 8.4–10.5)
CO2: 21 mEq/L (ref 19–32)
Chloride: 105 mEq/L (ref 96–112)
Creatinine, Ser: 2.79 mg/dL — ABNORMAL HIGH (ref 0.50–1.10)
GFR calc Af Amer: 21 mL/min — ABNORMAL LOW (ref >90)
GFR calc non Af Amer: 19 mL/min — ABNORMAL LOW (ref >90)
GLUCOSE: 94 mg/dL (ref 70–99)
PHOSPHORUS: 4.4 mg/dL (ref 2.3–4.6)
Potassium: 4.8 mEq/L (ref 3.7–5.3)
Sodium: 138 mEq/L (ref 137–147)

## 2014-02-25 LAB — URINE CULTURE
CULTURE: NO GROWTH
Colony Count: NO GROWTH

## 2014-02-25 MED ORDER — DOXYCYCLINE HYCLATE 100 MG PO TABS: 100.0000 mg | ORAL_TABLET | Freq: Two times a day (BID) | ORAL | Status: DC

## 2014-02-25 MED ORDER — NICOTINE 7 MG/24HR TD PT24: 7.0000 mg | MEDICATED_PATCH | Freq: Every day | TRANSDERMAL | Status: DC

## 2014-02-25 MED ORDER — CIPROFLOXACIN-DEXAMETHASONE 0.3-0.1 % OT SUSP: 4.0000 [drp] | Freq: Two times a day (BID) | OTIC | Status: DC

## 2014-02-25 NOTE — Discharge Instructions (Signed)
Thank you for trusting us with your medical care!  You were hospitalized for cellulitis but developed acute kidney injury.   For your skin infection (cellulitis), please continue taking doxycycline 100mg  TWICE a day until you run of medication.  For your kidney function, please return to the Internal Medicine Clinic for a recheck on Monday.  DO NOT take any of your HIV medications until you come to clinic on Monday.

## 2014-02-25 NOTE — Progress Notes (Signed)
Patient ID: Courtney Ellis, female    DOB: 12/16/1960, 53 y.o.   MRN: [id number] Nephrology Progress Note  S: Patient states she is doing well. She is ready to go home.  O:BP 115/87 mmHg  Pulse 60  Temp(Src) 98.6 F (37 C) (Oral)  Resp 16  Ht 5\' 3"  (1.6 m)  Wt 131 lb 6.3 oz (59.6 kg)  BMI 23.28 kg/m2  SpO2 97%  LMP 09/08/2011  Intake/Output Summary (Last 24 hours) at 02/25/14 0708 Last data filed at 02/24/14 1807  Gross per 24 hour  Intake   1203 ml  Output   1450 ml  Net   -247 ml   Intake/Output: I/O last 3 completed shifts: In: 1323 [P.O.:1320; I.V.:3] Out: 2250 [Urine:2250]  Intake/Output this shift:    Weight change:  WUJ:WJXBGen:Well appearing in no distress, laying in bed. Daughter in room with her JYN:WGNFAOZCVS:Regular rate and rhythm, systolic murmur heard Resp:Clear to auscultation bilaterally HYQ:MVHQAbd:Soft, non-tender, non-distended Ext:1+ pitting edema on right compared to trace on left up to knee. Right foot with dressing   Recent Labs Lab 02/20/14 2317 02/21/14 0543 02/22/14 0414 02/22/14 1250 02/23/14 0347 02/24/14 0416 02/25/14 0412  NA 136* 138 139 135* 134* 140 138  K 4.4 3.8 4.4 4.0 4.2 4.5 4.8  CL 99 102 102 99 101 107 105  CO2 25 26 25 25 20 22 21   GLUCOSE 99 146* 97 102* 108* 111* 94  BUN 12 15 27* 29* 31* 26* 24*  CREATININE 0.79 0.78 2.45* 2.76* 3.32* 3.31* 2.79*  ALBUMIN  --  2.8*  --   --   --   --  2.6*  CALCIUM 9.9 9.1 9.4 9.2 8.5 9.0 9.6  PHOS  --   --   --   --   --   --  4.4  AST  --  27  --   --   --   --   --   ALT  --  20  --   --   --   --   --    Liver Function Tests:  Recent Labs Lab 02/21/14 0543 02/25/14 0412  AST 27  --   ALT 20  --   ALKPHOS 71  --   BILITOT 0.2*  --   PROT 7.3  --   ALBUMIN 2.8* 2.6*   Recent Labs Lab 02/20/14 2317 02/21/14 0543 02/22/14 0414 02/23/14 0347  WBC 6.5 4.9 5.7 3.5*  NEUTROABS 3.8  --  4.9 1.9  HGB 14.0 12.3 12.1 11.2*  HCT 41.8 36.8 36.5 33.9*  MCV 90.9 90.9 91.0 92.9  PLT 236 212 199 158    Results for Courtney SpiceSTOKES, Kyah M (MRN [id number]) as of 02/25/2014 10:51  Ref. Range 02/24/2014 14:56  Protein Creatinine Ratio Latest Range: 0.00-0.15  0.07    Recent Labs Lab 02/22/14 1250  CKTOTAL 69   Studies/Results: [place] Renal  02/23/2014   CLINICAL DATA:  Acute renal failure, diabetes, history of HIV  EXAM: RENAL/URINARY TRACT ULTRASOUND COMPLETE  COMPARISON:  None.  FINDINGS: Right Kidney:  Length: 11.5 cm. Increased renal cortical echogenicity, trace fluid in the right renal pelvis without overt hydronephrosis. No focal mass.  Left Kidney:  Length: 12.7 cm. Increased renal cortical echogenicity without hydronephrosis or focal mass.  Bladder:  Appears normal for degree of bladder distention.  IMPRESSION: Increased renal cortical echogenicity bilaterally which may be associated with HIV nephropathy or some anti retro viral medications. No acute abnormality.   Electronically Signed  By: Christiana PellantGretchen  Green M.D.   On: 02/23/2014 13:38   . abacavir  600 mg Oral Daily  . amLODipine  5 mg Oral Daily  . ciprofloxacin-dexamethasone  4 drop Right Ear BID  . Darunavir Ethanolate  800 mg Oral Q breakfast  . dolutegravir  50 mg Oral Daily  . doxycycline  100 mg Oral Q12H  . heparin  5,000 Units Subcutaneous 3 times per day  . lamiVUDine  100 mg Oral Daily  . nicotine  7 mg Transdermal Daily  . ritonavir  100 mg Oral Q breakfast  . sodium chloride  3 mL Intravenous Q12H    Background: Patient is a 53 yo female with a history of HIV and hypertension, admitted with cellulitis of the right LE after a purported spider bite, with cultures subsequently + for MRSA who was initially treated with vancomycin/zosyn and received Toradol for pain. Creatinine on admission was 0.79 with an initial increase to 2.45 on hospital day #2 (11/4) with a peak of 3.32 on 11/5 and is now down-trending. Nephrotoxic agents discontinued on 11/4. Renal ultrasound significant for bilateral echogenicity. Patient appears euvolemic.  Creatinine reached plateau of 3.3 with a steadily decreasing creatinine and continued adequate urine output.  Assessment/Plan: 1. AKI: baseline creatinine 0.79 on admission (11/2). Increased in the past two days to 2.45>>2.76>>3.32. Down to 2.79 today (11/7). UOP of 1.45L in last 24 hours. Nephrotoxic exposure includes vancomycin (11/3>>11/4), Toradol (11/4), Zosyn (11/3>>11/4). Renal ultrasound significant for echogenicity. Suspect combined insult from hypoperfusion (with a couple of low blood pressures in the 90's) along with exposure to nephrotoxic agents. Agents discontinued. Seeing a decline in creatinine. Continue to monitor UOP (currently not oliguric) and fluid status. Can possibly be discharged from a renal standpoint if she has good follow-up for repeat check of creatinine on Monday. 2. Hypertension: on amlodipine 5mg , currently controlled. Will watch for hypotension. Management per primary team. 3. HIV: on lamivudine, abacavir, dolutegravir, and tironavir. Last CD4 count of 250 on 10/19. Last viral load of 21 on 10/19. Although these agents can cause kidney injury, doubt that they are related to recent AKI since patient arrived with normal creatinine. Recommend continuing antiviral treatment. Will watch kidney function as above. 4. Cellulitis: Improving on doxycycline. Culture + for MRSA.  Management per primary 5. Abnormal urinalysis: patient's urinalysis suggest of infection vs effects from AIN. Patient with no symptoms, so currently not being treated. Urine culture: no growth  Jacquelin Hawkingalph Nettey, MD PGY-2, [facility] 02/25/2014, 7:08 AM  I have seen and examined this patient and agree with plan and assessment by Dr. Caleb PoppNettey.  Creatinine is clearly starting to trend down. UOP good. No electrolyte issues.  I am OK with discharge if there is followup first of the week - I spoke with Dr. Cyndie ChimeGranfortuna and he indicated she will be followed up in the Internal Medicine Clinic (since no  prior PCP). (Outpatient nephrology followup not needed unless for some reason does not return to baseline).   Tyshell Ramberg B,MD 02/25/2014 10:49 AM

## 2014-02-25 NOTE — Progress Notes (Signed)
Subjective: Crt 2.8 this AM, improved from 3.3 yesterday..  This AM, she has no complaints and is wondering when she can go home. I explained to her that her renal function appeared to be improving but that she will need to follow-up first thing on Monday morning to recheck her kidney function.  Objective: Vital signs in last 24 hours: Filed Vitals:   02/23/14 2122 02/24/14 1327 02/24/14 2118 02/25/14 0531  BP: 127/91 139/87 159/94 115/87  Pulse: 72 72 75 60  Temp: 97.4 F (36.3 C) 98.3 F (36.8 C) 97.8 F (36.6 C) 98.6 F (37 C)  TempSrc: Axillary Oral Oral Oral  Resp: 17 17 18 16   Height:      Weight:      SpO2: 100% 100% 100% 97%   Weight change:   Intake/Output Summary (Last 24 hours) at 02/25/14 0709 Last data filed at 02/24/14 1807  Gross per 24 hour  Intake   1203 ml  Output   1450 ml  Net   -247 ml   General: resting in bed, NAD HEENT: PERRL, EOMI, no scleral icterus Cardiac: RRR, no rubs, murmurs or gallops Pulm: clear to auscultation bilaterally Abd: soft, nontender, nondistended, BS present Ext: R foot wrapped in bandage, space between the 4th and 5th digits with a shallow ulceration stable from yesterday, without drainage Neuro: alert and oriented X3, cranial nerves II-XII grossly intact, strength and sensation to light touch equal in bilateral upper and lower extremities   Lab Results: Basic Metabolic Panel:  Recent Labs Lab 02/24/14 0416 02/25/14 0412  NA 140 138  K 4.5 4.8  CL 107 105  CO2 22 21  GLUCOSE 111* 94  BUN 26* 24*  CREATININE 3.31* 2.79*  CALCIUM 9.0 9.6  PHOS  --  4.4   Liver Function Tests:  Recent Labs Lab 02/21/14 0543 02/25/14 0412  AST 27  --   ALT 20  --   ALKPHOS 71  --   BILITOT 0.2*  --   PROT 7.3  --   ALBUMIN 2.8* 2.6*    CBC:  Recent Labs Lab 02/22/14 0414 02/23/14 0347  WBC 5.7 3.5*  NEUTROABS 4.9 1.9  HGB 12.1 11.2*  HCT 36.5 33.9*  MCV 91.0 92.9  PLT 199 158   Urine Drug Screen: Drugs of  Abuse     Component Value Date/Time   LABOPIA POSITIVE* 10/22/2012 1326   COCAINSCRNUR NONE DETECTED 10/22/2012 1326   LABBENZ NONE DETECTED 10/22/2012 1326   AMPHETMU NONE DETECTED 10/22/2012 1326   THCU POSITIVE* 10/22/2012 1326   LABBARB NONE DETECTED 10/22/2012 1326    Studies/Results: [place] Renal  02/23/2014   CLINICAL DATA:  Acute renal failure, diabetes, history of HIV  EXAM: RENAL/URINARY TRACT ULTRASOUND COMPLETE  COMPARISON:  None.  FINDINGS: Right Kidney:  Length: 11.5 cm. Increased renal cortical echogenicity, trace fluid in the right renal pelvis without overt hydronephrosis. No focal mass.  Left Kidney:  Length: 12.7 cm. Increased renal cortical echogenicity without hydronephrosis or focal mass.  Bladder:  Appears normal for degree of bladder distention.  IMPRESSION: Increased renal cortical echogenicity bilaterally which may be associated with HIV nephropathy or some anti retro viral medications. No acute abnormality.   Electronically Signed   By: Christiana PellantGretchen  Green M.D.   On: 02/23/2014 13:38   Medications: I have reviewed the patient's current medications. Scheduled Meds: . abacavir  600 mg Oral Daily  . amLODipine  5 mg Oral Daily  . ciprofloxacin-dexamethasone  4 drop Right Ear  BID  . Darunavir Ethanolate  800 mg Oral Q breakfast  . dolutegravir  50 mg Oral Daily  . doxycycline  100 mg Oral Q12H  . heparin  5,000 Units Subcutaneous 3 times per day  . lamiVUDine  100 mg Oral Daily  . nicotine  7 mg Transdermal Daily  . ritonavir  100 mg Oral Q breakfast  . sodium chloride  3 mL Intravenous Q12H   Continuous Infusions:   PRN Meds:.diazepam, oxyCODONE-acetaminophen Assessment/Plan: Principal Problem:   Cellulitis Active Problems:   Human immunodeficiency virus (HIV) disease   Lesion of ear canal   AKI (acute kidney injury)   Cellulitis and abscess of foot  Ms. Kimes is a 53 year old female with HIV (CD4 count 250, viral load 21 from October 2015), HTN, h/o heroin  abuse, anxiety who presents with cellulitis and R ear canal lesion now with AKI likely multifactorial.  AKI: Crt improved to 2.8 today. Likely an adverse effect of vancomycin and Toradol in the setting of HIV therapy and possibly hypotension. UO 1.45L yesterday, a little more than 1.35L. -Continue PO intake of fluids with strict I&Os -Continue Percocet 5/325 q6h prn for pain control -Recheck BMET on Monday as outpatient -Nephrology following, appreciate recs   Purulent cellulitis of right foot: Continue doxycycline 100mg  BID. (Abx Day 5)  R ear canal lesion:  Continue Ciprodex ear drops. -Follow-up with ENT as outpatient  HIV: Continue home meds.  HTN: Normotensive. Continue home amlodipine.  Insomnia/anxiety: Continue home diazepam.  #FEN:  -Diet: Regular  #DVT prophylaxis: heparin 5000 units subcutaneous  #CODE STATUS: FULL CODE  Dispo: Today.  The patient does have a current PCP (No Pcp Per Patient) and does not [facility] after discharge.  The patient does not have transportation limitations that hinder transportation to clinic appointments.  .Services Needed at time of discharge: Y = Yes, Blank = No PT:   OT:   RN:   Equipment:   Other:     LOS: 5 days   Heywood Ilesushil Mima Cranmore, MD 02/25/2014, 7:09 AM

## 2014-02-27 ENCOUNTER — Other Ambulatory Visit: Payer: Medicaid Other

## 2014-02-27 ENCOUNTER — Telehealth: Payer: Self-pay | Admitting: *Deleted

## 2014-02-27 DIAGNOSIS — N179 Acute kidney failure, unspecified: Secondary | ICD-10-CM

## 2014-02-27 LAB — CULTURE, BLOOD (ROUTINE X 2)
CULTURE: NO GROWTH
Culture: NO GROWTH

## 2014-02-27 NOTE — Telephone Encounter (Signed)
Patient needs appointments for labs, follow up with Dr. Luciana Axeomer, elastography.  Per patient, [facility], will be getting her work schedule tomorrow.  Patient has missed a lot of work, wants to condense these appointments as much as possible.  Will wait to schedule elastography (and get authorization) until patient knows she will be able to make the appointment. Per Dr. Luciana Axeomer, should be following up for labs at/around 03/13/14. Andree CossHowell, Kawanna Christley M, RN

## 2014-02-28 LAB — BASIC METABOLIC PANEL
BUN: 26 mg/dL — ABNORMAL HIGH (ref 6–23)
CO2: 27 meq/L (ref 19–32)
CREATININE: 2.37 mg/dL — AB (ref 0.50–1.10)
Calcium: 9.9 mg/dL (ref 8.4–10.5)
Chloride: 103 mEq/L (ref 96–112)
Glucose, Bld: 151 mg/dL — ABNORMAL HIGH (ref 70–99)
Potassium: 5.1 mEq/L (ref 3.5–5.3)
Sodium: 137 mEq/L (ref 135–145)

## 2014-03-23 ENCOUNTER — Ambulatory Visit (INDEPENDENT_AMBULATORY_CARE_PROVIDER_SITE_OTHER): Payer: Medicaid Other | Admitting: Internal Medicine

## 2014-03-23 ENCOUNTER — Encounter: Payer: Self-pay | Admitting: Internal Medicine

## 2014-03-23 ENCOUNTER — Ambulatory Visit: Payer: Medicaid Other | Admitting: Internal Medicine

## 2014-03-23 VITALS — Temp 98.1°F | Ht 63.0 in | Wt 130.0 lb

## 2014-03-23 DIAGNOSIS — B20 Human immunodeficiency virus [HIV] disease: Secondary | ICD-10-CM

## 2014-03-23 NOTE — Progress Notes (Signed)
   Subjective:    Patient ID: Courtney Ellis, female    DOB: 11-04-1960, 53 y.o.   MRN: [id number]  HPI She comes in for follow up of HIV.  She was last seen 2 months ago after release from jail and was on darunavir/n, tivicay and truvada.  In jail she was changed to Triumeq and presista/n due to some mild renal insufficiency.  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  She has been out of meds 2 days.  Also with active chronic hepatitis C. Missed appt with new PCP.  Takes her meds with 2 missed doses since last visit.   Now with a full-time job with good benefits.  Got a car, doing well.    Review of Systems  Constitutional: Negative for activity change, appetite change and fatigue.  HENT: Negative for trouble swallowing.   Eyes: Negative for visual disturbance.  Respiratory: Negative for cough and shortness of breath.   Cardiovascular: Negative for chest pain.  Gastrointestinal: Negative for nausea and diarrhea.  Skin: Negative for rash.  Neurological: Negative for dizziness, light-headedness and headaches.  Psychiatric/Behavioral: Negative for sleep disturbance and dysphoric mood.       Objective:   Physical Exam  Constitutional: She appears well-developed and well-nourished. No distress.  HENT:  Mouth/Throat: No oropharyngeal exudate.  Eyes: No scleral icterus.  Cardiovascular: Normal rate, regular rhythm and normal heart sounds.   No murmur heard. Pulmonary/Chest: Effort normal and breath sounds normal. No respiratory distress.  Lymphadenopathy:    She has no cervical adenopathy.  Skin: No rash noted.  Multiple tattos          Assessment & Plan:

## 2014-03-23 NOTE — Assessment & Plan Note (Signed)
Labs today and rtc 3 months unless concerns

## 2014-03-24 ENCOUNTER — Other Ambulatory Visit: Payer: Self-pay | Admitting: *Deleted

## 2014-03-24 LAB — BASIC METABOLIC PANEL
BUN: 20 mg/dL (ref 6–23)
CHLORIDE: 102 meq/L (ref 96–112)
CO2: 27 mEq/L (ref 19–32)
CREATININE: 1.04 mg/dL (ref 0.50–1.10)
Calcium: 9.6 mg/dL (ref 8.4–10.5)
GLUCOSE: 101 mg/dL — AB (ref 70–99)
Potassium: 4.3 mEq/L (ref 3.5–5.3)
Sodium: 136 mEq/L (ref 135–145)

## 2014-03-24 LAB — T-HELPER CELL (CD4) - (RCID CLINIC ONLY)
CD4 T CELL ABS: 240 /uL — AB (ref 400–2700)
CD4 T CELL HELPER: 11 % — AB (ref 33–55)

## 2014-03-24 MED ORDER — DIAZEPAM 2 MG PO TABS: 2.0000 mg | ORAL_TABLET | Freq: Every evening | ORAL | Status: DC | PRN

## 2014-03-27 LAB — HIV-1 RNA QUANT-NO REFLEX-BLD
HIV 1 RNA Quant: 39 copies/mL — ABNORMAL HIGH (ref <20)
HIV-1 RNA Quant, Log: 1.59 {Log} — ABNORMAL HIGH (ref <1.30)

## 2014-03-31 ENCOUNTER — Other Ambulatory Visit: Payer: Self-pay | Admitting: Internal Medicine

## 2014-04-17 ENCOUNTER — Telehealth: Payer: Self-pay | Admitting: *Deleted

## 2014-04-17 ENCOUNTER — Other Ambulatory Visit: Payer: Self-pay | Admitting: *Deleted

## 2014-04-17 DIAGNOSIS — I10 Essential (primary) hypertension: Secondary | ICD-10-CM

## 2014-04-17 DIAGNOSIS — F411 Generalized anxiety disorder: Secondary | ICD-10-CM

## 2014-04-17 DIAGNOSIS — B20 Human immunodeficiency virus [HIV] disease: Secondary | ICD-10-CM

## 2014-04-17 MED ORDER — RITONAVIR 100 MG PO TABS: 100.0000 mg | ORAL_TABLET | Freq: Every day | ORAL | Status: DC

## 2014-04-17 MED ORDER — ABACAVIR-DOLUTEGRAVIR-LAMIVUD 600-50-300 MG PO TABS: 1.0000 | ORAL_TABLET | Freq: Every day | ORAL | Status: DC

## 2014-04-17 MED ORDER — DARUNAVIR ETHANOLATE 800 MG PO TABS: 800.0000 mg | ORAL_TABLET | Freq: Every day | ORAL | Status: DC

## 2014-04-17 MED ORDER — AMLODIPINE BESYLATE 5 MG PO TABS: 5.0000 mg | ORAL_TABLET | Freq: Every day | ORAL | Status: DC

## 2014-04-17 MED ORDER — DIAZEPAM 2 MG PO TABS: 2.0000 mg | ORAL_TABLET | Freq: Every evening | ORAL | Status: DC | PRN

## 2014-04-17 NOTE — Telephone Encounter (Signed)
Patient called asking for advice. She is unable to afford her copays.  RN transferred prescriptions to [facility] with patient's permission, notifying pharmacist of her financial status.  Pharmacist will waive the copays, will contact patient when the medications are ready for pick up.  Patient will also set up future deliveries of medications. Andree CossHowell, Mykal Kirchman M, RN

## 2014-06-22 ENCOUNTER — Encounter: Payer: Self-pay | Admitting: Internal Medicine

## 2014-06-22 ENCOUNTER — Ambulatory Visit (INDEPENDENT_AMBULATORY_CARE_PROVIDER_SITE_OTHER): Payer: Medicaid Other | Admitting: Internal Medicine

## 2014-06-22 VITALS — BP 159/89 | HR 59 | Temp 98.1°F | Wt 132.0 lb

## 2014-06-22 DIAGNOSIS — G4700 Insomnia, unspecified: Secondary | ICD-10-CM

## 2014-06-22 DIAGNOSIS — B20 Human immunodeficiency virus [HIV] disease: Secondary | ICD-10-CM

## 2014-06-22 DIAGNOSIS — B182 Chronic viral hepatitis C: Secondary | ICD-10-CM

## 2014-06-22 MED ORDER — TRAZODONE HCL 50 MG PO TABS: 50.0000 mg | ORAL_TABLET | Freq: Every day | ORAL | Status: DC

## 2014-06-22 NOTE — Progress Notes (Signed)
   Subjective:    Patient ID: Courtney Ellis, female    DOB: 04/07/1961, 54 y.o.   MRN: [id number]  HPI She comes in for follow up of HIV.  She was last seen 3 months ago and has been on darunavir/n, Serbiativicay and truvada.  In jail she was changed to Triumeq and Prezista/n due to some mild renal insufficiency.  Last creat though 1.04.  Taking medication well and no issues.  Unfortunately medicaid has expired.     Review of Systems  Constitutional: Negative for activity change, appetite change and fatigue.  HENT: Negative for trouble swallowing.   Eyes: Negative for visual disturbance.  Respiratory: Negative for cough and shortness of breath.   Cardiovascular: Negative for chest pain.  Gastrointestinal: Negative for nausea and diarrhea.  Skin: Negative for rash.  Neurological: Negative for dizziness, light-headedness and headaches.  Psychiatric/Behavioral: Negative for sleep disturbance and dysphoric mood.       Objective:   Physical Exam  Constitutional: She appears well-developed and well-nourished. No distress.  HENT:  Mouth/Throat: No oropharyngeal exudate.  Eyes: No scleral icterus.  Cardiovascular: Normal rate, regular rhythm and normal heart sounds.   No murmur heard. Pulmonary/Chest: Effort normal and breath sounds normal. No respiratory distress.  Lymphadenopathy:    She has no cervical adenopathy.  Skin: No rash noted.  Multiple tattos          Assessment & Plan:

## 2014-06-22 NOTE — Assessment & Plan Note (Signed)
Doing well and hopefully will not have a lapse in her medication either by re-applying for Medicaid or other drug assistance program. We'll check labs today and she can return in 3 months.

## 2014-06-22 NOTE — Assessment & Plan Note (Signed)
We'll get her in for care after her next visit once her insurance situation has resolved.

## 2014-06-22 NOTE — Assessment & Plan Note (Signed)
She has been taking Valium but now is not as effective. I will try trazodone when she has medication coverage. If this is not effective she can go back to Valium if needed.

## 2014-06-23 LAB — T-HELPER CELL (CD4) - (RCID CLINIC ONLY)
CD4 % Helper T Cell: 13 % — ABNORMAL LOW (ref 33–55)
CD4 T CELL ABS: 300 /uL — AB (ref 400–2700)

## 2014-06-26 LAB — HIV-1 RNA QUANT-NO REFLEX-BLD
HIV 1 RNA QUANT: 71 {copies}/mL — AB (ref <20)
HIV-1 RNA QUANT, LOG: 1.85 {Log} — AB (ref <1.30)

## 2014-06-29 ENCOUNTER — Other Ambulatory Visit: Payer: Self-pay | Admitting: *Deleted

## 2014-06-29 DIAGNOSIS — B20 Human immunodeficiency virus [HIV] disease: Secondary | ICD-10-CM

## 2014-06-29 MED ORDER — ABACAVIR-DOLUTEGRAVIR-LAMIVUD 600-50-300 MG PO TABS: 1.0000 | ORAL_TABLET | Freq: Every day | ORAL | Status: DC

## 2014-06-29 MED ORDER — DARUNAVIR ETHANOLATE 800 MG PO TABS: 800.0000 mg | ORAL_TABLET | Freq: Every day | ORAL | Status: DC

## 2014-06-29 MED ORDER — RITONAVIR 100 MG PO TABS: 100.0000 mg | ORAL_TABLET | Freq: Every day | ORAL | Status: DC

## 2014-06-29 NOTE — Telephone Encounter (Signed)
ADAP Application 

## 2014-10-24 ENCOUNTER — Ambulatory Visit: Payer: Medicaid Other | Admitting: Internal Medicine

## 2014-11-06 ENCOUNTER — Other Ambulatory Visit: Payer: Self-pay | Admitting: Internal Medicine

## 2014-11-06 ENCOUNTER — Other Ambulatory Visit: Payer: Self-pay | Admitting: *Deleted

## 2014-11-06 DIAGNOSIS — F411 Generalized anxiety disorder: Secondary | ICD-10-CM

## 2014-11-06 DIAGNOSIS — B20 Human immunodeficiency virus [HIV] disease: Secondary | ICD-10-CM

## 2014-11-06 MED ORDER — DARUNAVIR ETHANOLATE 800 MG PO TABS: 800.0000 mg | ORAL_TABLET | Freq: Every day | ORAL | Status: DC

## 2014-11-17 ENCOUNTER — Telehealth: Payer: Self-pay | Admitting: *Deleted

## 2014-11-17 NOTE — Telephone Encounter (Signed)
RW/ADAP renewal, pt has MD appt 11/23/14.  Requested pt arrive 30 minutes early to renew RW/ADAP.  Pt also needs PAP smear appt scheduled.

## 2014-11-22 ENCOUNTER — Telehealth: Payer: Self-pay | Admitting: *Deleted

## 2014-11-22 NOTE — Telephone Encounter (Signed)
Left message reminding of appointment 8/4 3:00

## 2014-11-23 ENCOUNTER — Ambulatory Visit: Payer: Medicaid Other | Admitting: Internal Medicine

## 2014-11-23 ENCOUNTER — Ambulatory Visit: Payer: Medicaid Other

## 2014-12-04 ENCOUNTER — Other Ambulatory Visit: Payer: Self-pay | Admitting: Internal Medicine

## 2014-12-04 DIAGNOSIS — B20 Human immunodeficiency virus [HIV] disease: Secondary | ICD-10-CM

## 2014-12-27 ENCOUNTER — Other Ambulatory Visit: Payer: Self-pay | Admitting: Internal Medicine

## 2015-01-02 ENCOUNTER — Other Ambulatory Visit (HOSPITAL_COMMUNITY)
Admission: RE | Admit: 2015-01-02 | Discharge: 2015-01-02 | Disposition: A | Discharge status: Home / Self Care | Payer: Medicaid Other | Source: Ambulatory Visit | Attending: Internal Medicine | Admitting: Internal Medicine

## 2015-01-02 ENCOUNTER — Other Ambulatory Visit: Payer: Self-pay | Admitting: *Deleted

## 2015-01-02 ENCOUNTER — Ambulatory Visit (INDEPENDENT_AMBULATORY_CARE_PROVIDER_SITE_OTHER): Payer: Medicaid Other | Admitting: Internal Medicine

## 2015-01-02 ENCOUNTER — Encounter: Payer: Self-pay | Admitting: Internal Medicine

## 2015-01-02 VITALS — BP 121/83 | HR 80 | Temp 98.2°F | Wt 136.0 lb

## 2015-01-02 DIAGNOSIS — B20 Human immunodeficiency virus [HIV] disease: Secondary | ICD-10-CM | POA: Diagnosis not present

## 2015-01-02 DIAGNOSIS — Z79899 Other long term (current) drug therapy: Secondary | ICD-10-CM | POA: Diagnosis not present

## 2015-01-02 DIAGNOSIS — Z23 Encounter for immunization: Secondary | ICD-10-CM | POA: Diagnosis not present

## 2015-01-02 DIAGNOSIS — B182 Chronic viral hepatitis C: Secondary | ICD-10-CM | POA: Diagnosis present

## 2015-01-02 DIAGNOSIS — F411 Generalized anxiety disorder: Secondary | ICD-10-CM

## 2015-01-02 DIAGNOSIS — I10 Essential (primary) hypertension: Secondary | ICD-10-CM

## 2015-01-02 DIAGNOSIS — Z113 Encounter for screening for infections with a predominantly sexual mode of transmission: Secondary | ICD-10-CM | POA: Diagnosis not present

## 2015-01-02 LAB — COMPLETE METABOLIC PANEL WITH GFR
ALBUMIN: 3.7 g/dL (ref 3.6–5.1)
ALK PHOS: 70 U/L (ref 33–130)
ALT: 27 U/L (ref 6–29)
AST: 35 U/L (ref 10–35)
BILIRUBIN TOTAL: 0.4 mg/dL (ref 0.2–1.2)
BUN: 14 mg/dL (ref 7–25)
CALCIUM: 9.4 mg/dL (ref 8.6–10.4)
CO2: 25 mmol/L (ref 20–31)
Chloride: 102 mmol/L (ref 98–110)
Creat: 0.84 mg/dL (ref 0.50–1.05)
GFR, EST NON AFRICAN AMERICAN: 80 mL/min (ref >=60)
GFR, Est African American: >89 mL/min (ref >=60)
Glucose, Bld: 89 mg/dL (ref 65–99)
POTASSIUM: 4.2 mmol/L (ref 3.5–5.3)
Sodium: 134 mmol/L — ABNORMAL LOW (ref 135–146)
Total Protein: 7.4 g/dL (ref 6.1–8.1)

## 2015-01-02 LAB — LIPID PANEL
CHOLESTEROL: 189 mg/dL (ref 125–200)
HDL: 49 mg/dL (ref >=46)
LDL Cholesterol: 110 mg/dL (ref <130)
TRIGLYCERIDES: 148 mg/dL (ref <150)
Total CHOL/HDL Ratio: 3.9 Ratio (ref <=5.0)
VLDL: 30 mg/dL (ref <30)

## 2015-01-02 MED ORDER — DARUNAVIR ETHANOLATE 800 MG PO TABS: 800.0000 mg | ORAL_TABLET | Freq: Every day | ORAL | Status: DC

## 2015-01-02 MED ORDER — ABACAVIR-DOLUTEGRAVIR-LAMIVUD 600-50-300 MG PO TABS: 1.0000 | ORAL_TABLET | Freq: Every day | ORAL | Status: DC

## 2015-01-02 MED ORDER — RITONAVIR 100 MG PO TABS: 100.0000 mg | ORAL_TABLET | Freq: Every day | ORAL | Status: DC

## 2015-01-02 MED ORDER — DIAZEPAM 2 MG PO TABS: ORAL_TABLET | ORAL | Status: DC

## 2015-01-02 MED ORDER — AMLODIPINE BESYLATE 5 MG PO TABS: 5.0000 mg | ORAL_TABLET | Freq: Every day | ORAL | Status: DC

## 2015-01-02 NOTE — Assessment & Plan Note (Signed)
Labs today and can return in 4 months unless concerns.

## 2015-01-02 NOTE — Progress Notes (Signed)
   Subjective:    Patient ID: Courtney Ellis, female    DOB: [birth year], 54 y.o.   MRN: [id number]  HPI She comes in for follow up of HIV.  She was last seen 6 months ago and has been on darunavir/n and Triumeq.  In jail she was changed to Triumeq and Prezista/n due to some mild renal insufficiency.  Taking medication well and no issues.  Last CD4 300 and viral load 71 copies.  Denies any missed doses. Trazodone has not worked well for sleep and wants to go back to diazepam.    Review of Systems  Constitutional: Negative for activity change, appetite change and fatigue.  HENT: Negative for trouble swallowing.   Eyes: Negative for visual disturbance.  Skin: Negative for rash.  Neurological: Negative for light-headedness.  Psychiatric/Behavioral: Positive for sleep disturbance. Negative for dysphoric mood.       Objective:   Physical Exam  Constitutional: She appears well-developed and well-nourished. No distress.  HENT:  Mouth/Throat: No oropharyngeal exudate.  Eyes: No scleral icterus.  Cardiovascular: Normal rate, regular rhythm and normal heart sounds.   No murmur heard. Pulmonary/Chest: Effort normal and breath sounds normal. No respiratory distress.  Lymphadenopathy:    She has no cervical adenopathy.  Skin: No rash noted.  Multiple tattos    SHx: no current smoking, now 2 months in her new apt with her 2 sons.        Assessment & Plan:

## 2015-01-02 NOTE — Assessment & Plan Note (Signed)
Will go back to valium

## 2015-01-02 NOTE — Assessment & Plan Note (Signed)
Stable on medication

## 2015-01-02 NOTE — Assessment & Plan Note (Signed)
I will recheck her viral load and genotype and consider treatment options next visit.  Will need elastography as well.

## 2015-01-02 NOTE — Assessment & Plan Note (Signed)
Will recheck today.

## 2015-01-03 LAB — CBC WITH DIFFERENTIAL/PLATELET
BASOS PCT: 0 % (ref 0–1)
Basophils Absolute: 0 10*3/uL (ref 0.0–0.1)
EOS ABS: 0.1 10*3/uL (ref 0.0–0.7)
Eosinophils Relative: 1 % (ref 0–5)
HCT: 35.5 % — ABNORMAL LOW (ref 36.0–46.0)
HEMOGLOBIN: 12.2 g/dL (ref 12.0–15.0)
Lymphocytes Relative: 37 % (ref 12–46)
Lymphs Abs: 2 10*3/uL (ref 0.7–4.0)
MCH: 30.2 pg (ref 26.0–34.0)
MCHC: 34.4 g/dL (ref 30.0–36.0)
MCV: 87.9 fL (ref 78.0–100.0)
MONO ABS: 0.5 10*3/uL (ref 0.1–1.0)
MPV: 9.7 fL (ref 8.6–12.4)
Monocytes Relative: 9 % (ref 3–12)
NEUTROS ABS: 2.9 10*3/uL (ref 1.7–7.7)
NEUTROS PCT: 53 % (ref 43–77)
PLATELETS: 325 10*3/uL (ref 150–400)
RBC: 4.04 MIL/uL (ref 3.87–5.11)
RDW: 13.3 % (ref 11.5–15.5)
WBC: 5.4 10*3/uL (ref 4.0–10.5)

## 2015-01-03 LAB — HIV-1 RNA QUANT-NO REFLEX-BLD
HIV 1 RNA Quant: <20 copies/mL (ref <20)
HIV-1 RNA Quant, Log: <1.3 {Log} (ref <1.30)

## 2015-01-03 LAB — RPR

## 2015-01-04 LAB — URINE CYTOLOGY ANCILLARY ONLY
CHLAMYDIA, DNA PROBE: NEGATIVE
NEISSERIA GONORRHEA: NEGATIVE

## 2015-01-04 LAB — HCV RNA QUANT RFLX ULTRA OR GENOTYP
HCV QUANT LOG: 6.2 {Log} — AB (ref <1.18)
HCV QUANT: 1582774 [IU]/mL — AB (ref <15)

## 2015-01-04 LAB — T-HELPER CELL (CD4) - (RCID CLINIC ONLY)
CD4 % Helper T Cell: 14 % — ABNORMAL LOW (ref 33–55)
CD4 T CELL ABS: 250 /uL — AB (ref 400–2700)

## 2015-01-08 LAB — HEPATITIS C GENOTYPE

## 2015-01-10 ENCOUNTER — Telehealth: Payer: Self-pay | Admitting: *Deleted

## 2015-01-10 NOTE — Telephone Encounter (Signed)
Yes, valium   She said the other medication did not work. thanks

## 2015-01-10 NOTE — Telephone Encounter (Signed)
Phone call to [employer]  Share Dr. Ephriam Knuckles recent office note.  Per office note 01/02/15 from Dr. Luciana Axe pt was placed on Valium for insomnia.  Will send this message to Dr. Luciana Axe to confirm Valium for insomnia rather than Restoril.  Per [employer] the patient now has restored Medicaid coverage.

## 2015-01-12 ENCOUNTER — Ambulatory Visit: Payer: Medicaid Other

## 2015-02-02 ENCOUNTER — Encounter (HOSPITAL_COMMUNITY): Payer: Self-pay

## 2015-02-02 ENCOUNTER — Emergency Department (HOSPITAL_COMMUNITY)
Admission: EM | Admit: 2015-02-02 | Discharge: 2015-02-02 | Discharge status: Against Medical Advice | Payer: Medicaid Other | Attending: Emergency Medicine | Admitting: Emergency Medicine

## 2015-02-02 DIAGNOSIS — Z720 Tobacco use: Secondary | ICD-10-CM | POA: Diagnosis not present

## 2015-02-02 DIAGNOSIS — I10 Essential (primary) hypertension: Secondary | ICD-10-CM | POA: Insufficient documentation

## 2015-02-02 DIAGNOSIS — Z5329 Procedure and treatment not carried out because of patient's decision for other reasons: Secondary | ICD-10-CM

## 2015-02-02 DIAGNOSIS — Z79899 Other long term (current) drug therapy: Secondary | ICD-10-CM | POA: Diagnosis not present

## 2015-02-02 DIAGNOSIS — Z5321 Procedure and treatment not carried out due to patient leaving prior to being seen by health care provider: Secondary | ICD-10-CM | POA: Diagnosis not present

## 2015-02-02 DIAGNOSIS — Z21 Asymptomatic human immunodeficiency virus [HIV] infection status: Secondary | ICD-10-CM | POA: Insufficient documentation

## 2015-02-02 DIAGNOSIS — Z5320 Procedure and treatment not carried out because of patient's decision for unspecified reasons: Secondary | ICD-10-CM

## 2015-02-02 DIAGNOSIS — T401X1A Poisoning by heroin, accidental (unintentional), initial encounter: Secondary | ICD-10-CM | POA: Insufficient documentation

## 2015-02-02 NOTE — ED Notes (Signed)
Pt came in via EMS. Pt took heroin around 1100 this morning. Pt also took xannax last night. Pt was found apneic on the scene. EMS gave 2mg  Narcan IN and 1mg  Narcan IV.

## 2015-02-02 NOTE — ED Notes (Signed)
Pt left AMA before getting treatment. Pt refused to sign and left w/ IV. Was chased down. IV removed.

## 2015-02-03 ENCOUNTER — Emergency Department (HOSPITAL_COMMUNITY)
Admission: EM | Admit: 2015-02-03 | Discharge: 2015-02-03 | Disposition: A | Discharge status: Home / Self Care | Payer: Medicaid Other | Attending: Emergency Medicine | Admitting: Emergency Medicine

## 2015-02-03 ENCOUNTER — Encounter (HOSPITAL_COMMUNITY): Payer: Self-pay | Admitting: Emergency Medicine

## 2015-02-03 DIAGNOSIS — Z21 Asymptomatic human immunodeficiency virus [HIV] infection status: Secondary | ICD-10-CM | POA: Diagnosis not present

## 2015-02-03 DIAGNOSIS — R0602 Shortness of breath: Secondary | ICD-10-CM | POA: Diagnosis not present

## 2015-02-03 DIAGNOSIS — Y9289 Other specified places as the place of occurrence of the external cause: Secondary | ICD-10-CM | POA: Diagnosis not present

## 2015-02-03 DIAGNOSIS — Z79899 Other long term (current) drug therapy: Secondary | ICD-10-CM | POA: Diagnosis not present

## 2015-02-03 DIAGNOSIS — Z720 Tobacco use: Secondary | ICD-10-CM | POA: Diagnosis not present

## 2015-02-03 DIAGNOSIS — R55 Syncope and collapse: Secondary | ICD-10-CM | POA: Diagnosis not present

## 2015-02-03 DIAGNOSIS — I10 Essential (primary) hypertension: Secondary | ICD-10-CM | POA: Insufficient documentation

## 2015-02-03 DIAGNOSIS — Y9389 Activity, other specified: Secondary | ICD-10-CM | POA: Insufficient documentation

## 2015-02-03 DIAGNOSIS — Y998 Other external cause status: Secondary | ICD-10-CM | POA: Diagnosis not present

## 2015-02-03 DIAGNOSIS — T401X1A Poisoning by heroin, accidental (unintentional), initial encounter: Secondary | ICD-10-CM | POA: Insufficient documentation

## 2015-02-03 DIAGNOSIS — X58XXXA Exposure to other specified factors, initial encounter: Secondary | ICD-10-CM | POA: Insufficient documentation

## 2015-02-03 NOTE — ED Notes (Signed)
Pt denies SI/HI 

## 2015-02-03 NOTE — Discharge Instructions (Signed)
° °Accidental Overdose °A drug overdose occurs when a chemical substance (drug or medication) is used in amounts large enough to overcome a person. This may result in severe illness or death. This is a type of poisoning. Accidental overdoses of medications or other substances come from a variety of reasons. When this happens accidentally, it is often because the person taking the substance does not know enough about what they have taken. Drugs which commonly cause overdose deaths are alcohol, psychotropic medications (medications which affect the mind), pain medications, illegal drugs (street drugs) such as cocaine and heroin, and multiple drugs taken at the same time. It may result from careless behavior (such as over-indulging at a party). Other causes of overdose may include multiple drug use, a lapse in memory, or drug use after a period of no drug use.  °Sometimes overdosing occurs because a person cannot remember if they have taken their medication.  °A common unintentional overdose in young children involves multi-vitamins containing iron. Iron is a part of the hemoglobin molecule in blood. It is used to transport oxygen to living cells. When taken in small amounts, iron allows the body to restock hemoglobin. In large amounts, it causes problems in the body. If this overdose is not treated, it can lead to death. °Never take medicines that show signs of tampering or do not seem quite right. Never take medicines in the dark or in poor lighting. Read the label and check each dose of medicine before you take it. When adults are poisoned, it happens most often through carelessness or lack of information. Taking medicines in the dark or taking medicine prescribed for someone else to treat the same type of problem is a dangerous practice. °SYMPTOMS  °Symptoms of overdose depend on the medication and amount taken. They can vary from over-activity with stimulant over-dosage, to sleepiness from depressants such as  alcohol, narcotics and tranquilizers. Confusion, dizziness, nausea and vomiting may be present. If problems are severe enough coma and death may result. °DIAGNOSIS  °Diagnosis and management are generally straightforward if the drug is known. Otherwise it is more difficult. At times, certain symptoms and signs exhibited by the patient, or blood tests, can reveal the drug in question.  °TREATMENT  °In an emergency department, most patients can be treated with supportive measures. Antidotes may be available if there has been an overdose of opioids or benzodiazepines. A rapid improvement will often occur if this is the cause of overdose. °At home or away from medical care: °· There may be no immediate problems or warning signs in children. °· Not everything works well in all cases of poisoning. °· Take immediate action. Poisons may act quickly. °· If you think someone has swallowed medicine or a household product, and the person is unconscious, having seizures (convulsions), or is not breathing, immediately call for an ambulance. °IF a person is conscious and appears to be doing OK but has swallowed a poison: °· Do not wait to see what effect the poison will have. Immediately call a poison control center (listed in the white pages of your telephone book under "Poison Control" or inside the front cover with other emergency numbers). Some poison control centers have TTY capability for the deaf. Check with your local center if you or someone in your family requires this service. °· Keep the container so you can read the label on the product for ingredients. °· Describe what, when, and how much was taken and the age and condition of the person   poisoned. Inform them if the person is vomiting, choking, drowsy, shows a change in color or temperature of skin, is conscious or unconscious, or is convulsing. °· Do not cause vomiting unless instructed by medical personnel. Do not induce vomiting or force liquids into a person who  is convulsing, unconscious, or very drowsy. °Stay calm and in control.  °· Activated charcoal also is sometimes used in certain types of poisoning and you may wish to add a supply to your emergency medicines. It is available without a prescription. Call a poison control center before using this medication. °PREVENTION  °Thousands of children die every year from unintentional poisoning. This may be from household chemicals, poisoning from carbon monoxide in a car, taking their parent's medications, or simply taking a few iron pills or vitamins with iron. Poisoning comes from unexpected sources. °· Store medicines out of the sight and reach of children, preferably in a locked cabinet. Do not keep medications in a food cabinet. Always store your medicines in a secure place. Get rid of expired medications. °· If you have children living with you or have them as occasional guests, you should have child-resistant caps on your medicine containers. Keep everything out of reach. Child proof your home. °· If you are called to the telephone or to answer the door while you are taking a medicine, take the container with you or put the medicine out of the reach of small children. °· Do not take your medication in front of children. Do not tell your child how good a medication is and how good it is for them. They may get the idea it is more of a treat. °· If you are an adult and have accidentally taken an overdose, you need to consider how this happened and what can be done to prevent it from happening again. If this was from a street drug or alcohol, determine if there is a problem that needs addressing. If you are not sure a problems exists, it is easy to talk to a professional and ask them if they think you have a problem. It is better to handle this problem in this way before it happens again and has a much worse consequence. °  °This information is not intended to replace advice given to you by your health care provider. Make  sure you discuss any questions you have with your health care provider. °  °Document Released: 06/21/2004 Document Revised: 04/28/2014 Document Reviewed: 09/25/2014 °Elsevier Interactive Patient Education ©2016 Elsevier Inc. ° ° ° °Emergency Department Resource Guide °1) Find a Doctor and Pay Out of Pocket °Although you won't have to find out who is covered by your insurance plan, it is a good idea to ask around and get recommendations. You will then need to call the office and see if the doctor you have chosen will accept you as a new patient and what types of options they offer for patients who are self-pay. Some doctors offer discounts or will set up payment plans for their patients who do not have insurance, but you will need to ask so you aren't surprised when you get to your appointment. ° °2) Contact Your Local Health Department °Not all health departments have doctors that can see patients for sick visits, but many do, so it is worth a call to see if yours does. If you don't know where your local health department is, you can check in your phone book. The CDC also has a tool to help you locate   your state's health department, and many state websites also have listings of all of their local health departments. ° °3) Find a Walk-in Clinic °If your illness is not likely to be very severe or complicated, you may want to try a walk in clinic. These are popping up all over the country in pharmacies, drugstores, and shopping centers. They're usually staffed by nurse practitioners or physician assistants that have been trained to treat common illnesses and complaints. They're usually fairly quick and inexpensive. However, if you have serious medical issues or chronic medical problems, these are probably not your best option. ° °No Primary Care Doctor: °- Call [facility] at  832-8000 - they can help you locate a primary care doctor that  accepts your insurance, provides certain services, etc. °- Physician Referral  Service- 1-800-533-3463 ° °Chronic Pain Problems: °Organization         Address  Phone   Notes  °[facility]  (336) 297-2271 Patients need to be referred by their primary care doctor.  ° °Medication Assistance: °Organization         Address  Phone   Notes  °[place] [facility] [address] °[place], [place] [address] (336) 641-8030 --Must be a resident of [place] °-- Must have NO insurance coverage whatsoever (no Medicaid/ Medicare, etc.) °-- The pt. MUST have a primary care doctor that directs their care regularly and follows them in the community °  °[facility]  (866) 331-1348   °[employer]  (888) 892-1162   ° °Agencies that provide inexpensive medical care: °Organization         Address  Phone   Notes  °[facility]  (336) 832-8035   °[facility]    (336) 832-7272   °[facility] [address] °[place], [place] [address] (336) 832-4777   °[facility] [address], °[place] (336) 271-4999   °[facility]    (336) 373-0678   °[facility]    (336) 272-1050   °[facility] ° [address], [place] Phone:  (336) 832-4444, Fax:  (336) 832-4440 Hours of Operation:  9 am - 6 pm, M-F.  Also accepts Medicaid/Medicare and self-pay.  °[facility] ° [address], [place] Phone: (336) [phone], Fax: (336) [phone]. Hours of Operation:  8:30 am - 5:30 pm, M-F.  Also accepts Medicaid and self-pay.  °[facility] [place] [address], [place] Phone: (336) 878-6027   °[facility] [address], [place], [place] (336)723-1848, Ext. 123 Mondays & Thursdays: 7-9 AM.  First 15 patients are seen on a first come, first serve basis. °  ° °Medicaid-accepting [place] Providers: ° °Organization         Address  Phone   Notes  °[facility] [address], [place] (336) 641-2100 Also accepts self-pay patients.  °[facility] [address], [place] ° (336) 856-9996   °[facility] [address], [place] (336) 288-8857   °[facility] [address] [place] [address], [place] (336) 299-7000   °Veita Bland [address],   ° (336) 373-1557 Only accepts [place] Access Medicaid patients after they have their name applied to their card.  ° °Self-Pay (no insurance) in [place]: ° °Organization           Address  Phone   Notes  °Sickle Cell Patients, [facility] [address], [place] (336) 832-1970   °[facility] [address], [place] (336) 832-4400   °[facility] [place] ° [address], [place] (336) 992-4800   °[facility]/Dr. Osei-Bonsu ° [address] [place] [address], [place] or [address], [place] (336) [phone] Phone number for both [place] and [place] locations is the same.  °[facility] [address], [place] (336) 299-0000   °[facility] [address] [place] [address], [place] or [address] (336) 852-7530 °(336) 878-2260   °[facility] [address], [place] (336) 350-1642, phone; (336) 294-5005, fax Sees patients 1st and 3rd Saturday of every month.  Must not qualify for public or private insurance (i.e. Medicaid, Medicare, [place] Health Choice, Veterans' Benefits) • Household income should be no more than 200% of the poverty level •The clinic cannot treat you if you are pregnant or think you are pregnant • Sexually transmitted diseases are not treated at the clinic.  ° ° °Dental Care: °Organization         Address  Phone  Notes  °[facility] [address], [place] (336) 641-6152 Accepts children up to age 21 who are enrolled in  Medicaid or [place] Health Choice; pregnant women with a Medicaid card; and children who have applied for Medicaid or [place] Health Choice, but were declined, whose parents can pay a reduced fee at time of service.  °[facility] [place]  [address], [place] (336) 641-7733 Accepts children up to age 21 who are enrolled in Medicaid or [place] Health Choice; pregnant women with a Medicaid card; and children who have applied for Medicaid or [place] Health Choice, but were declined, whose parents can pay a reduced fee at time of service.  °[facility] ° [address], [place] (336) 641-4533 Patients are seen by appointment only. Walk-ins are not accepted. [facility] will see patients 18 years of age and older. °Monday - Tuesday (8am-5pm) °Most Wednesdays (8:30-5pm) °$30 per visit, cash only  °[facility] ° [address], [place] (336) [phone] Patients are seen by appointment only. Walk-ins are not accepted. [facility] will see patients 18 years of age and older. °One Wednesday Evening (Monthly: Volunteer Based).  $30 per visit, cash only  °[facility]  (919) [phone] for adults; Children under age 4, call Graduate Pediatric Dentistry at ([phone]) [phone]. Children aged 4-14, please call ([phone]) [phone] to request a pediatric application. ° Dental services are provided in all areas of dental care including fillings, crowns and bridges, complete and partial dentures, implants, gum treatment, root canals, and extractions. Preventive care is also provided. Treatment is provided to both adults and children. °Patients are selected via a lottery and there is often a waiting list. °  °[facility] [address], °[place] ° (336) 763-8833 [email or web address] °  °[facility] [address], [place], [place] (336)723-1848, Ext. 123 Second and Fourth Thursday of each month, opens at 6:30  AM; Clinic ends at 9 AM.  Patients are seen on a first-come first-served basis, and a limited number are seen during each clinic.  ° °[facility] ° [address], [place], [place] (336) 723-7904   Eligibility Requirements °You must have lived in [place], [place], or [place] counties for at least the last three months. °  You cannot be eligible for state or federal sponsored healthcare insurance, including Veterans Administration, Medicaid, or Medicare. °  You generally cannot be eligible for healthcare insurance through your employer.  °  How to apply: °Eligibility screenings are held every Tuesday and Wednesday afternoon from 1:00 pm until 4:00 pm. You do not need an appointment for the interview!  °[facility] [address], [place], [place] 336-631-2330   °[facility]  336-342-8273   °[facility]  336-703-3100   °[facility]  336-570-6415   ° °Behavioral Health Resources in the Community: °Intensive Outpatient Programs °Organization         Address  Phone  Notes  °[facility] [address], [place], [place] 336-878-6098   °[facility] [address], [place], [place] 336-832-9800   °[facility] [address], [place], [place] ° 336-882-2125   °[facility] [address],  °[place], [place] 1-800-853-5163 or 336-641-4981   °Substance Abuse Resources °Organization         Address  Phone  Notes  °[facility]  336-882-2125   °[facility]  336-784-9470   °[facility]  336-285-9073   °[facility]  336-845-3988   °Residential & Outpatient Substance Abuse Program  1-800-659-3381   °Psychological Services °Organization         Address  Phone  Notes  °[facility]  336- 832-9600   °[facility]  336- 378-7881   °[facility] [address], [place] 1-800-853-5163 or  336-641-4981   ° °Mobile Crisis Teams °Organization         Address  Phone  Notes  °[facility], Mobile Crisis Care Unit  1-877-626-1772   °Assertive °[facility] ° [address] [place], [place] 336-834-9664   °Sharon DeEsch [address] °[place] 336-554-5454   ° °Self-Help/Support Groups °Organization         Address  Phone             Notes  °[facility] [place] - variety of support groups  336- 373-1402 Call for more information  °Narcotics Anonymous (NA), [facility] [address], °[place]  2 meetings at this location  ° °Residential Treatment Programs °Organization         Address  Phone  Notes  °[facility] [address],    °[place]  1-866-801-8205   °[facility] ° [address], [place], [place] 704-293-8524   °[facility] [address], [place] 336-845-3988 Admissions: 8am-3pm M-F  °[facility] [address]    °[place], [place] 336-841-1104   °[facility] [address], [place], [place] 336-379-7146   °[facility] [address]  °[place], [place] 336-285-9073   °[facility] Intensive Outpatient [address], [place], [place] 336-852-3033   °[facility] [address]  °[place], [place] 1-877-615-2722 or 336-784-9470   °[facility] [address] [place], [place] 336-227-7417 Accepts Medicaid  °[facility] [address]  °[place] 1-800-659-3381 Substance Abuse/Addiction Treatment  ° °[place] Behavioral Health Resources °Organization           Address  Phone  Notes  °[facility]  (888) 581-9988   °Julie Brannon, PhD [address] [place], [place]   (336) 349-5553 or (336) 951-0000   °[facility]   [address] °[place], [place] (336) 349-4454   °[facility] [address],  [place], [place] (336) 342-8316 Insurance/Medicaid/sponsorship through [facility]  °[facility] [address]                                    [place], [place] (336) 342-8316 Therapy/tele-psych/case  °[facility] [address]  ° [place], [place] (336) 349-2233    °Dr. Arfeen  (336) 349-4544   °[facility] [place]  [employer] [place] [facility] 1) [address], [place] °2) [address], [place] °3)  [address], [place] (336) 349-3220 °(336) 342-7768 ° °(336) 342-8140   °[place] [facility] (336) 342-1394 or (336) 342-3537 (After Hours)    ° ° ° ° °

## 2015-02-03 NOTE — ED Notes (Signed)
Pt reports using heroin today. Pt was found by family. States that she was alone when she was using heroin.

## 2015-02-03 NOTE — ED Notes (Signed)
Received pt via EMS with c/o from home with c/o found by family in bathroom suffering from heroin overdose. Pt had to be Bag Valve mask and given 2 mg of Narcan.

## 2015-02-03 NOTE — ED Notes (Signed)
MD at bedside. 

## 2015-02-03 NOTE — ED Notes (Signed)
MD at bedside. Pt requesting to leave AMA.

## 2015-02-03 NOTE — ED Provider Notes (Signed)
CSN: [id number]     Arrival date & time 02/03/15  1601 History   First MD Initiated Contact with Patient 02/03/15 1606     Chief Complaint  Patient presents with  . Drug Overdose  . Drug Problem   Patient is a 54 y.o. female presenting with Overdose and drug problem.  Drug Overdose This is a recurrent problem. The current episode started today. The problem occurs daily. The problem has been unchanged. Pertinent negatives include no congestion, coughing, fever or vomiting. The treatment provided significant relief.  Drug Problem Pertinent negatives include no congestion, coughing, fever or vomiting.    Past Medical History  Diagnosis Date  . Hypertension   . HIV infection (HCC)   . Heroin abuse    History reviewed. No pertinent past surgical history. Family History  Problem Relation Age of Onset  . Hypertension Mother    Social History  Substance Use Topics  . Smoking status: Current Every Day Smoker -- 0.20 packs/day    Types: Cigarettes  . Smokeless tobacco: Never Used     Comment: 3 cigarettes a day  . Alcohol Use: No   OB History    No data available     Review of Systems  Constitutional: Negative for fever.  HENT: Negative for congestion.   Respiratory: Positive for shortness of breath. Negative for cough.   Gastrointestinal: Negative for vomiting.  Neurological: Positive for syncope.  All other systems reviewed and are negative.  Allergies  Review of patient's allergies indicates no known allergies.  Home Medications   Prior to Admission medications   Medication Sig Start Date End Date Taking? Authorizing Provider  Abacavir-Dolutegravir-Lamivud (TRIUMEQ) 600-50-300 MG TABS Take 1 tablet by mouth daily. 01/02/15   Gardiner Barefoot, MD  amLODipine (NORVASC) 5 MG tablet Take 1 tablet (5 mg total) by mouth daily. 01/02/15   Gardiner Barefoot, MD  Darunavir Ethanolate (PREZISTA) 800 MG tablet Take 1 tablet (800 mg total) by mouth daily. 01/02/15   Gardiner Barefoot, MD   diazepam (VALIUM) 2 MG tablet TAKE ONE TABLET   BY MOUTH   AT BEDTIME 01/02/15   Gardiner Barefoot, MD  ritonavir (NORVIR) 100 MG TABS tablet Take 1 tablet (100 mg total) by mouth daily. 01/02/15   Gardiner Barefoot, MD  traZODone (DESYREL) 50 MG tablet Take 1 tablet (50 mg total) by mouth at bedtime. 06/22/14   Gardiner Barefoot, MD   BP 125/83 mmHg  Pulse 85  Temp(Src) 97.8 F (36.6 C)  Resp 10  Ht  (1.6 m)  Wt 136 lb (61.689 kg)  BMI 24.10 kg/m2  SpO2 99%  LMP 09/08/2011 Physical Exam  Constitutional: She is oriented to person, place, and time. She appears well-developed and well-nourished. No distress.  HENT:  Head: Atraumatic.  Eyes: Pupils are equal, round, and reactive to light.  Neck: Normal range of motion.  Cardiovascular: Normal rate.   Pulmonary/Chest: Effort normal. No respiratory distress.  Abdominal: She exhibits no distension.  Musculoskeletal: Normal range of motion. She exhibits no edema.  Neurological: She is alert and oriented to person, place, and time.  Skin: Skin is warm and dry. She is not diaphoretic.  Psychiatric: She has a normal mood and affect. Her behavior is normal. Judgment and thought content normal.  Nursing note and vitals reviewed.   ED Course  Procedures (including critical care time)   EKG Interpretation   Date/Time:  Saturday February 03 2015 16:10:38 EDT Ventricular Rate:  77 PR  Interval:  199 QRS Duration: 80 QT Interval:  379 QTC Calculation: 429 R Axis:   59 Text Interpretation:  Sinus rhythm Nonspecific T abnormalities, lateral  leads No significant change since last tracing Confirmed by Mirian MoGentry,  Matthew [phone]([id number] on 02/03/2015 4:31:08 PM      MDM   Patient presents emergency department today after a heroin overdose. She presented here yesterday for the same but left AMA after receiving Narcan by EMS. Patient here received Narcan by EMS in an bag valve ventilation. Patient states she is not trying to kill herself. She also endorses  taking Xanax 2 nights ago. She denies any drugs. Her son is with her states that she's been having heroin addiction for the past 21 years. She's been clean for the past few months prior to her previously episodes. Patient had desired to seek help and to stop.  Patient observed and discharged home with resources. Patient and son in agreement with plan.    Final diagnoses:  Heroin overdose, accidental or unintentional, initial encounter      Deirdre PeerJeremiah Shela Esses, MD 02/04/15 [id number]  Mirian MoMatthew Gentry, MD 02/05/15 [phone]

## 2015-02-04 NOTE — ED Provider Notes (Signed)
CSN: [id number]     Arrival date & time 02/02/15  1241 History   First MD Initiated Contact with Patient 02/02/15 1254     Chief Complaint  Patient presents with  . Drug Overdose     (Consider location/radiation/quality/duration/timing/severity/associated sxs/prior Treatment) Patient is a 54 y.o. female presenting with Overdose. The history is provided by the patient.  Drug Overdose This is a new problem. The current episode started less than 1 hour ago. The problem occurs constantly. The problem has been resolved. Pertinent negatives include no chest pain and no abdominal pain. Nothing aggravates the symptoms. Relieved by: Narcan given by EMS.    Past Medical History  Diagnosis Date  . Hypertension   . HIV infection (HCC)   . Heroin abuse    History reviewed. No pertinent past surgical history. Family History  Problem Relation Age of Onset  . Hypertension Mother    Social History  Substance Use Topics  . Smoking status: Current Every Day Smoker -- 0.20 packs/day    Types: Cigarettes  . Smokeless tobacco: Never Used     Comment: 3 cigarettes a day  . Alcohol Use: No   OB History    No data available     Review of Systems  Cardiovascular: Negative for chest pain.  Gastrointestinal: Negative for abdominal pain.  All other systems reviewed and are negative.     Allergies  Review of patient's allergies indicates no known allergies.  Home Medications   Prior to Admission medications   Medication Sig Start Date End Date Taking? Authorizing Provider  Abacavir-Dolutegravir-Lamivud (TRIUMEQ) 600-50-300 MG TABS Take 1 tablet by mouth daily. 01/02/15   Gardiner Barefootobert W Comer, MD  amLODipine (NORVASC) 5 MG tablet Take 1 tablet (5 mg total) by mouth daily. 01/02/15   Gardiner Barefootobert W Comer, MD  Darunavir Ethanolate (PREZISTA) 800 MG tablet Take 1 tablet (800 mg total) by mouth daily. 01/02/15   Gardiner Barefootobert W Comer, MD  diazepam (VALIUM) 2 MG tablet TAKE ONE TABLET   BY MOUTH   AT BEDTIME 01/02/15    Gardiner Barefootobert W Comer, MD  ritonavir (NORVIR) 100 MG TABS tablet Take 1 tablet (100 mg total) by mouth daily. 01/02/15   Gardiner Barefootobert W Comer, MD  traZODone (DESYREL) 50 MG tablet Take 1 tablet (50 mg total) by mouth at bedtime. 06/22/14   Gardiner Barefootobert W Comer, MD   BP 164/85 mmHg  Pulse 75  Temp(Src) 97.5 F (36.4 C) (Oral)  Resp 20  Ht 5\' 3"  (1.6 m)  Wt 136 lb (61.689 kg)  BMI 24.10 kg/m2  SpO2 97%  LMP 09/08/2011 Physical Exam  Constitutional: She is oriented to person, place, and time. She appears well-developed and well-nourished. No distress.  HENT:  Head: Normocephalic.  Eyes: Conjunctivae are normal.  Neck: Neck supple. No tracheal deviation present.  Cardiovascular: Normal rate, regular rhythm and normal heart sounds.   Pulmonary/Chest: Effort normal and breath sounds normal. No respiratory distress.  Abdominal: Soft. She exhibits no distension.  Neurological: She is alert and oriented to person, place, and time.  Skin: Skin is warm and dry.  Psychiatric: She has a normal mood and affect. She expresses inappropriate judgment (poor decision making).    ED Course  Procedures (including critical care time) Labs Review Labs Reviewed - No data to display  Imaging Review No results found. I have personally reviewed and evaluated these images and lab results as part of my medical decision-making.   EKG Interpretation   Date/Time:  Friday February 02 2015  12:55:27 EDT Ventricular Rate:  71 PR Interval:  208 QRS Duration: 72 QT Interval:  378 QTC Calculation: 411 R Axis:   67 Text Interpretation:  Sinus rhythm Borderline prolonged PR interval  Confirmed by Cristoval Teall MD, Saraiah Bhat ([id number]) on 02/02/2015 1:09:54 PM      MDM   Final diagnoses:  Left against medical advice  Heroin overdose, accidental or unintentional, initial encounter    54 y.o. female presents after heroin overdose. She admits to taking her normal meds and then taking heroin in attempt to get high. She is alert, breathing  normally, is ambulatory, and capable of decision making currently and despite recommendation to stay for further monitoring or recurrence she left against medical advice and was advised to be around people in case she were to worsen.     Lyndal Pulley, MD 02/04/15 [phone]

## 2015-04-18 ENCOUNTER — Other Ambulatory Visit: Payer: Self-pay | Admitting: Internal Medicine

## 2015-05-29 ENCOUNTER — Other Ambulatory Visit: Payer: Medicaid Other

## 2015-05-31 ENCOUNTER — Other Ambulatory Visit: Payer: Medicaid Other

## 2015-05-31 ENCOUNTER — Other Ambulatory Visit: Payer: Self-pay | Admitting: Internal Medicine

## 2015-05-31 DIAGNOSIS — Z21 Asymptomatic human immunodeficiency virus [HIV] infection status: Secondary | ICD-10-CM

## 2015-05-31 LAB — CBC WITH DIFFERENTIAL/PLATELET
Basophils Absolute: 0 10*3/uL (ref 0.0–0.1)
Basophils Relative: 0 % (ref 0–1)
Eosinophils Absolute: 0.2 10*3/uL (ref 0.0–0.7)
Eosinophils Relative: 3 % (ref 0–5)
HEMATOCRIT: 42.5 % (ref 36.0–46.0)
HEMOGLOBIN: 14 g/dL (ref 12.0–15.0)
LYMPHS ABS: 2.4 10*3/uL (ref 0.7–4.0)
LYMPHS PCT: 38 % (ref 12–46)
MCH: 29.4 pg (ref 26.0–34.0)
MCHC: 32.9 g/dL (ref 30.0–36.0)
MCV: 89.1 fL (ref 78.0–100.0)
MONO ABS: 0.6 10*3/uL (ref 0.1–1.0)
MONOS PCT: 10 % (ref 3–12)
MPV: 10.1 fL (ref 8.6–12.4)
NEUTROS ABS: 3.1 10*3/uL (ref 1.7–7.7)
Neutrophils Relative %: 49 % (ref 43–77)
Platelets: 357 10*3/uL (ref 150–400)
RBC: 4.77 MIL/uL (ref 3.87–5.11)
RDW: 13.5 % (ref 11.5–15.5)
WBC: 6.3 10*3/uL (ref 4.0–10.5)

## 2015-05-31 LAB — COMPREHENSIVE METABOLIC PANEL
ALT: 35 U/L — ABNORMAL HIGH (ref 6–29)
AST: 48 U/L — ABNORMAL HIGH (ref 10–35)
Albumin: 3.8 g/dL (ref 3.6–5.1)
Alkaline Phosphatase: 100 U/L (ref 33–130)
BUN: 9 mg/dL (ref 7–25)
CHLORIDE: 101 mmol/L (ref 98–110)
CO2: 27 mmol/L (ref 20–31)
Calcium: 10.1 mg/dL (ref 8.6–10.4)
Creat: 0.79 mg/dL (ref 0.50–1.05)
GLUCOSE: 126 mg/dL — AB (ref 65–99)
POTASSIUM: 4.6 mmol/L (ref 3.5–5.3)
Sodium: 135 mmol/L (ref 135–146)
Total Bilirubin: 0.5 mg/dL (ref 0.2–1.2)
Total Protein: 8.3 g/dL — ABNORMAL HIGH (ref 6.1–8.1)

## 2015-06-01 LAB — T-HELPER CELL (CD4) - (RCID CLINIC ONLY)
CD4 % Helper T Cell: 13 % — ABNORMAL LOW (ref 33–55)
CD4 T CELL ABS: 330 /uL — AB (ref 400–2700)

## 2015-06-01 LAB — HIV-1 RNA QUANT-NO REFLEX-BLD
HIV 1 RNA Quant: 67 copies/mL — ABNORMAL HIGH (ref <20)
HIV-1 RNA Quant, Log: 1.83 Log copies/mL — ABNORMAL HIGH (ref <1.30)

## 2015-06-18 ENCOUNTER — Other Ambulatory Visit: Payer: Self-pay | Admitting: Internal Medicine

## 2015-06-19 ENCOUNTER — Ambulatory Visit (INDEPENDENT_AMBULATORY_CARE_PROVIDER_SITE_OTHER): Payer: Medicaid Other | Admitting: Internal Medicine

## 2015-06-19 ENCOUNTER — Ambulatory Visit: Payer: Medicaid Other | Admitting: *Deleted

## 2015-06-19 ENCOUNTER — Other Ambulatory Visit: Payer: Self-pay | Admitting: *Deleted

## 2015-06-19 VITALS — BP 158/88 | HR 70 | Temp 97.9°F

## 2015-06-19 DIAGNOSIS — F1910 Other psychoactive substance abuse, uncomplicated: Secondary | ICD-10-CM

## 2015-06-19 DIAGNOSIS — B20 Human immunodeficiency virus [HIV] disease: Secondary | ICD-10-CM

## 2015-06-19 DIAGNOSIS — G4700 Insomnia, unspecified: Secondary | ICD-10-CM | POA: Diagnosis not present

## 2015-06-19 MED ORDER — DIAZEPAM 5 MG PO TABS: 5.0000 mg | ORAL_TABLET | Freq: Every evening | ORAL | Status: DC | PRN

## 2015-06-19 NOTE — Progress Notes (Signed)
   Subjective:    Patient ID: Courtney Ellis, female    DOB: 1960-09-25, 55 y.o.   MRN: [id number]  HPI She comes in for follow up of HIV and substance abuse.  She was last seen 5 months ago and has been on darunavir/n and Triumeq.  In jail prior to that she was changed to Triumeq and Prezista/n due to some mild renal insufficiency. Last CD4 330 and viral load 67 copies prior to this visit.  Was off about 2 weeks after relapsing with heroin.  Was in ED x 2 with overdose.  Has an intake tomorrow for services and interested in suboxone.  Wants to get back to working, being drug free.  Interested in SA counseling, other help.     Review of Systems  Constitutional: Negative for activity change, appetite change and fatigue.  HENT: Negative for trouble swallowing.   Eyes: Negative for visual disturbance.  Skin: Negative for rash.  Neurological: Negative for light-headedness.  Psychiatric/Behavioral: Positive for sleep disturbance. Negative for dysphoric mood.       Objective:   Physical Exam  Constitutional: She appears well-developed and well-nourished. No distress.  HENT:  Mouth/Throat: No oropharyngeal exudate.  Eyes: No scleral icterus.  Cardiovascular: Normal rate, regular rhythm and normal heart sounds.   No murmur heard. Pulmonary/Chest: Effort normal and breath sounds normal. No respiratory distress.  Lymphadenopathy:    She has no cervical adenopathy.  Skin: No rash noted.  Multiple tattos    SHx: active relapse of drug use with heroin      Assessment & Plan:

## 2015-06-19 NOTE — Assessment & Plan Note (Signed)
Once she is off of heroin, ok to restart the valium at 5 mg at night as needed PRN, #30.

## 2015-06-19 NOTE — Assessment & Plan Note (Signed)
Relapsed, saw our SA counselor. She also is interested in [place] our Scientist, physiological and would appreciate the support.

## 2015-06-19 NOTE — Assessment & Plan Note (Signed)
Back on medications and viral load ok.  Will have her come back in about 6 weeks to check and will repeat labs then.

## 2015-06-19 NOTE — BH Specialist Note (Signed)
Counselor met with Courtney Ellis today in the exam room for observed suspicious behaviors.  Patient is known for her poly substance abuse.  Patient was oriented times four with good affect and dress.  Patient shared that she needed to stop hanging out with people who use.  Patient said that she has made some sincere effort lately to get her act together because she does not want to keep getting high and possibly dying. Patient shared that she abuses heroin and was really tired of it. Counselor encouraged patient to follow through with the outpatient services so she can have a better social support network and accountability with the drug screens randomly done with the outpatient services. Counselor recommended that patient check back in with counselor in a couple of weeks. Patient agreed.  Rolena Infante, MA [facility]

## 2015-06-21 ENCOUNTER — Telehealth: Payer: Self-pay | Admitting: *Deleted

## 2015-06-21 NOTE — Telephone Encounter (Signed)
RN received a referral on 06/19/15 for [facility] Care Nurse Services. Patient has a history of Polysubstance abuse poor medication management which is the reason for the referral. Patient has been contacted within the  48 hr time frame for referrals.  RN contacted the patient today, 06/21/15 and spoke with [name]. After introducing myself, Courtney Ellis states she has to make a quick call for transportation and will give me call back. RN agreed and thanked Courtney Ellis

## 2015-07-02 ENCOUNTER — Telehealth: Payer: Self-pay | Admitting: *Deleted

## 2015-07-02 NOTE — Telephone Encounter (Signed)
Following up on referrals, contacted the patient and left a message stating my name, reason for my call and that I would love to be a support for the patient. Asked the patient to return my call and I would like to further offer services to the patient 

## 2015-07-02 NOTE — Telephone Encounter (Signed)
Following up on referrals, contacted the patient and left a message stating my name, reason for my call and that I would love to be a support for the patient. Asked the patient to return my call and I would like to further offer services to the patient

## 2015-07-03 ENCOUNTER — Telehealth: Payer: Self-pay | Admitting: *Deleted

## 2015-07-03 NOTE — Telephone Encounter (Signed)
Called patient to see how she was doing, if she had started the [facility] program, if she would see Ambre.  Patient sounded groggy on the phone, but stated she got her kids off to school and had a few things to take care of today.  She states she has started the [facility] program and is supposed to go back in 2 weeks for bloodwork.  She states she is still trying to get her head around it, but is ready for the commitment. She has support through [facility] and will answer Ambre's text today to engage in her services. RN told patient that I was proud of her, and that we are here to support her any way that she needs.  RN will call to check in on her again. Andree CossHowell, Nyela Cortinas M, RN

## 2015-07-06 ENCOUNTER — Telehealth: Payer: Self-pay | Admitting: *Deleted

## 2015-07-06 NOTE — Telephone Encounter (Signed)
Pt left message stating the she was coughing up mucus.  Unable to contact pt on return phone call.  Advised to to increase liquids and try OTC robitussin per the package instructions.  Advised to go to Urgent Care if she starts having a fever.

## 2015-08-21 ENCOUNTER — Encounter: Payer: Self-pay | Admitting: Internal Medicine

## 2015-08-21 ENCOUNTER — Ambulatory Visit (INDEPENDENT_AMBULATORY_CARE_PROVIDER_SITE_OTHER): Payer: Medicaid Other | Admitting: Internal Medicine

## 2015-08-21 VITALS — BP 125/83 | HR 72 | Temp 97.9°F | Ht 63.0 in | Wt 135.0 lb

## 2015-08-21 DIAGNOSIS — F1910 Other psychoactive substance abuse, uncomplicated: Secondary | ICD-10-CM | POA: Diagnosis not present

## 2015-08-21 DIAGNOSIS — B20 Human immunodeficiency virus [HIV] disease: Secondary | ICD-10-CM | POA: Diagnosis present

## 2015-08-21 DIAGNOSIS — B182 Chronic viral hepatitis C: Secondary | ICD-10-CM | POA: Diagnosis not present

## 2015-08-21 MED ORDER — CETIRIZINE HCL 10 MG PO CAPS: 1.0000 | ORAL_CAPSULE | Freq: Every day | ORAL | Status: DC

## 2015-08-21 MED ORDER — DARUNAVIR-COBICISTAT 800-150 MG PO TABS: 1.0000 | ORAL_TABLET | Freq: Every day | ORAL | Status: DC

## 2015-08-21 NOTE — Progress Notes (Signed)
Patient ID: Courtney Ellis, female   DOB: 04/05/1961, 55 y.o.   MRN: [id number] CC: Follow up for HIV  Interval history: Currently is asymptomatic and well-controlled on Prezista/n and Triumeq.  Since last visit she denies missing any further doses.  She has unfortunately struggled again with drug use and relapsed again 4 days ago after running out of suboxone.  She though continues to be determined to remain drug free and has been able to continue working and actually got full-time hours, overtime.  Her kids still are doing well and good students.  She continues on Step [facility] Step and is followed by Me[facility].     Hoping to streamline her regimen as well.   Has hepatitis C.    Prior to Admission medications   Medication Sig Start Date End Date Taking? Authorizing Provider  Abacavir-Dolutegravir-Lamivud (TRIUMEQ) 600-50-300 MG TABS Take 1 tablet by mouth daily. 01/02/15  Yes Gardiner Barefootobert W Baani Bober, MD  amLODipine (NORVASC) 5 MG tablet TAKE 1 TABLET (5 MG TOTAL) BY MOUTH DAILY. 04/18/15  Yes Gardiner Barefootobert W Lonald Troiani, MD  diazepam (VALIUM) 5 MG tablet Take 1 tablet (5 mg total) by mouth at bedtime as needed for anxiety. 06/19/15  Yes Gardiner Barefootobert W Larinda Herter, MD  traZODone (DESYREL) 50 MG tablet Take 1 tablet (50 mg total) by mouth at bedtime. 06/22/14  Yes Gardiner Barefootobert W Datha Kissinger, MD  darunavir-cobicistat (PREZCOBIX) 800-150 MG tablet Take 1 tablet by mouth daily. Swallow whole. Do NOT crush, break or chew tablets. Take with food. 08/21/15   Gardiner Barefootobert W Yvette Loveless, MD    Review of Systems Constitutional: negative for fevers, chills and sweats Respiratory: positive for sputum, negative for hemoptysis Musculoskeletal: negative for myalgias and arthralgias All other systems reviewed and are negative   Physical Exam: CONSTITUTIONAL:in no apparent distress and alert  Filed Vitals:   08/21/15 0950  BP: 125/83  Pulse: 72  Temp: 97.9 F (36.6 C)   Eyes: anicteric HENT: no thrush, no cervical lymphadenopathy Respiratory: Normal respiratory  effort; CTA B CArdiovascular: RRR  Lab Results  Component Value Date   HIV1RNAQUANT 67* 05/31/2015   HIV1RNAQUANT <20 01/02/2015   HIV1RNAQUANT 71* 06/22/2014   No components found for: HIV1GENOTYPRPLUS No components found for: THELPERCELL

## 2015-08-21 NOTE — Assessment & Plan Note (Signed)
With some active drug use she is still not in a place to start treatment but will consider it when she is able to remain completely drug free.

## 2015-08-21 NOTE — Assessment & Plan Note (Signed)
She continues to struggle but is still trying and for the most part is drug free with short relapses.  She will continue with Step by Step.

## 2015-08-21 NOTE — Assessment & Plan Note (Signed)
Will streamline to Prezcobix in place of Prezista/n. Continue triumeq. Labs today and rtc 3 months

## 2015-08-22 LAB — HIV-1 RNA QUANT-NO REFLEX-BLD: HIV-1 RNA Quant, Log: <1.3 Log copies/mL (ref <1.30)

## 2015-08-23 LAB — T-HELPER CELL (CD4) - (RCID CLINIC ONLY)
CD4 T CELL HELPER: 15 % — AB (ref 33–55)
CD4 T Cell Abs: 360 /uL — ABNORMAL LOW (ref 400–2700)

## 2015-08-29 ENCOUNTER — Encounter: Payer: Self-pay | Admitting: *Deleted

## 2015-09-24 ENCOUNTER — Telehealth: Payer: Self-pay | Admitting: *Deleted

## 2015-09-24 NOTE — Telephone Encounter (Signed)
Patient left message in Triage stating she didn't feel well.  RN returned the call, got her voicemail.  Left message asking her to call back if she still needed advice or to be seen. Courtney Ellis, Hazelynn Mckenny M, RN

## 2015-09-25 ENCOUNTER — Telehealth: Payer: Self-pay | Admitting: *Deleted

## 2015-09-25 ENCOUNTER — Encounter: Payer: Self-pay | Admitting: Internal Medicine

## 2015-09-25 ENCOUNTER — Ambulatory Visit (INDEPENDENT_AMBULATORY_CARE_PROVIDER_SITE_OTHER): Payer: Medicaid Other | Admitting: Internal Medicine

## 2015-09-25 VITALS — BP 148/91 | HR 69 | Temp 98.0°F | Wt 134.0 lb

## 2015-09-25 DIAGNOSIS — J189 Pneumonia, unspecified organism: Secondary | ICD-10-CM | POA: Diagnosis present

## 2015-09-25 DIAGNOSIS — B20 Human immunodeficiency virus [HIV] disease: Secondary | ICD-10-CM

## 2015-09-25 DIAGNOSIS — I10 Essential (primary) hypertension: Secondary | ICD-10-CM

## 2015-09-25 MED ORDER — ALBUTEROL SULFATE HFA 108 (90 BASE) MCG/ACT IN AERS: 2.0000 | INHALATION_SPRAY | Freq: Four times a day (QID) | RESPIRATORY_TRACT | Status: DC | PRN

## 2015-09-25 MED ORDER — AZITHROMYCIN 250 MG PO TABS: ORAL_TABLET | ORAL | Status: DC

## 2015-09-25 MED ORDER — GUAIFENESIN-CODEINE 100-10 MG/5ML PO SOLN: 5.0000 mL | Freq: Four times a day (QID) | ORAL | Status: DC | PRN

## 2015-09-25 NOTE — Telephone Encounter (Signed)
Pt offered appt for Wed., June 7 4:15 OM with Dr. Drue SecondSnider.  Pt scheduled for this appt.

## 2015-09-25 NOTE — Progress Notes (Signed)
Patient ID: Courtney Ellis, female   DOB: 11/24/1960, 55 y.o.   MRN: [id number]     RFV: sick visit, persistent courgh  Patient ID: Courtney Ellis, female   DOB: 05/12/1960, 55 y.o.   MRN: [id number]  HPI 54yo F with HIV disease-HCV co infection currently on triumeq-DRVc. Seen last week with Dr. Luciana Axeomer. She feels that her symptoms are not improved and still is significantly short of breath, wheezing with productive cough, low grade temperature.  Outpatient Encounter Prescriptions as of 09/25/2015  Medication Sig  . Abacavir-Dolutegravir-Lamivud (TRIUMEQ) 600-50-300 MG TABS Take 1 tablet by mouth daily.  Marland Kitchen. amLODipine (NORVASC) 5 MG tablet TAKE 1 TABLET (5 MG TOTAL) BY MOUTH DAILY.  Marland Kitchen. Cetirizine HCl 10 MG CAPS Take 1 capsule (10 mg total) by mouth daily.  . darunavir-cobicistat (PREZCOBIX) 800-150 MG tablet Take 1 tablet by mouth daily. Swallow whole. Do NOT crush, break or chew tablets. Take with food.  . diazepam (VALIUM) 5 MG tablet Take 1 tablet (5 mg total) by mouth at bedtime as needed for anxiety.  . traZODone (DESYREL) 50 MG tablet Take 1 tablet (50 mg total) by mouth at bedtime.   No facility-administered encounter medications on file as of 09/25/2015.     Patient Active Problem List   Diagnosis Date Noted  . Substance abuse 06/19/2015  . Lesion of ear canal 02/21/2014  . Routine screening for STI (sexually transmitted infection) 02/06/2014  . Encounter for long-term (current) use of medications 02/06/2014  . HEADACHE, TENSION 06/11/2010  . ELEVATED BLOOD PRESSURE 08/03/2007  . INSOMNIA, CHRONIC 02/17/2007  . Chronic hepatitis C without hepatic coma (HCC) 11/24/2006  . DEPRESSIVE DISORDER, MAJOR RCR, UNSPECIFIED 07/28/2006  . Human immunodeficiency virus (HIV) disease (HCC) 05/15/2006     Health Maintenance Due  Topic Date Due  . TETANUS/TDAP  03/29/1981  . MAMMOGRAM  03/29/2012  . COLONOSCOPY  03/29/2012  . PAP SMEAR  05/03/2013     Review of Systems  Physical Exam   BP  148/91 mmHg  Pulse 69  Temp(Src) 98 F (36.7 C) (Oral)  Wt 134 lb (60.782 kg)  LMP 09/08/2011 Physical Exam  Constitutional:  oriented to person, place, and time. appears well-developed and well-nourished. No distress.  HENT: [place]/AT, PERRLA, no scleral icterus Mouth/Throat: Oropharynx is clear and moist. No oropharyngeal exudate.  Cardiovascular: Normal rate, regular rhythm and normal heart sounds. Exam reveals no gallop and no friction rub.  No murmur heard.  Pulmonary/Chest: Effort normal and breath sounds normal. No respiratory distress. Expiratory wheezing. Decrease breath sounds at left base. Neck = supple, no nuchal rigidity Abdominal: Soft. Bowel sounds are normal.  exhibits no distension. There is no tenderness.  Lymphadenopathy: no cervical adenopathy. No axillary adenopathy Neurological: alert and oriented to person, place, and time.  Skin: Skin is warm and dry. No rash noted. No erythema.  Psychiatric: a normal mood and affect.  behavior is normal.   Lab Results  Component Value Date   CD4TCELL 15* 08/21/2015   Lab Results  Component Value Date   CD4TABS 360* 08/21/2015   CD4TABS 330* 05/31/2015   CD4TABS 250* 01/02/2015   Lab Results  Component Value Date   HIV1RNAQUANT <20 08/21/2015   Lab Results  Component Value Date   HEPBSAB NO 06/15/2006   No results found for: RPR  CBC Lab Results  Component Value Date   WBC 6.3 05/31/2015   RBC 4.77 05/31/2015   HGB 14.0 05/31/2015   HCT 42.5 05/31/2015   PLT  357 05/31/2015   MCV 89.1 05/31/2015   MCH 29.4 05/31/2015   MCHC 32.9 05/31/2015   RDW 13.5 05/31/2015   LYMPHSABS 2.4 05/31/2015   MONOABS 0.6 05/31/2015   EOSABS 0.2 05/31/2015   BASOSABS 0.0 05/31/2015   BMET Lab Results  Component Value Date   NA 135 05/31/2015   K 4.6 05/31/2015   CL 101 05/31/2015   CO2 27 05/31/2015   GLUCOSE 126* 05/31/2015   BUN 9 05/31/2015   CREATININE 0.79 05/31/2015   CALCIUM 10.1 05/31/2015   GFRNONAA 80  01/02/2015   GFRAA >89 01/02/2015     Assessment and Plan  Upper respiratory infection with possible CAP = will give albuterol inhaler treatment in clinic to see if it improves her symptoms. Continue with mucinex, gave azithromycin 5 day course.  hiv disease = continue on taking her regimen  Hypertension = will assess at next visit if need to increase amlodipine dosing. At this visit permissive due to acute illness

## 2015-09-26 MED ORDER — ALBUTEROL SULFATE (2.5 MG/3ML) 0.083% IN NEBU
2.5000 mg | INHALATION_SOLUTION | Freq: Once | RESPIRATORY_TRACT | Status: AC
  Administered 2015-09-25: 2.5 mg via RESPIRATORY_TRACT

## 2015-09-26 NOTE — Progress Notes (Signed)
Nebulizer treatment administered per verbal order from Dr. Drue SecondSnider. Patiet with 99% O2 saturation on room air before and after treatment, but patient reported feeling "like I'm breathing easier" at end of treatment, better air flow after treatment. Andree CossHowell, Rachard Isidro M, RN

## 2015-11-21 ENCOUNTER — Ambulatory Visit: Payer: Medicaid Other | Admitting: Internal Medicine

## 2015-12-11 ENCOUNTER — Ambulatory Visit: Payer: Medicaid Other | Admitting: Internal Medicine

## 2016-01-18 ENCOUNTER — Other Ambulatory Visit: Payer: Self-pay | Admitting: Internal Medicine

## 2016-01-18 DIAGNOSIS — B20 Human immunodeficiency virus [HIV] disease: Secondary | ICD-10-CM

## 2016-01-18 DIAGNOSIS — I10 Essential (primary) hypertension: Secondary | ICD-10-CM

## 2016-02-11 ENCOUNTER — Ambulatory Visit: Payer: Medicaid Other | Admitting: *Deleted

## 2016-02-11 ENCOUNTER — Ambulatory Visit (INDEPENDENT_AMBULATORY_CARE_PROVIDER_SITE_OTHER): Payer: Medicaid Other | Admitting: Internal Medicine

## 2016-02-11 ENCOUNTER — Encounter: Payer: Self-pay | Admitting: Internal Medicine

## 2016-02-11 VITALS — BP 137/83 | HR 73 | Temp 98.0°F | Ht 63.0 in | Wt 141.8 lb

## 2016-02-11 DIAGNOSIS — Z23 Encounter for immunization: Secondary | ICD-10-CM | POA: Diagnosis not present

## 2016-02-11 DIAGNOSIS — Z113 Encounter for screening for infections with a predominantly sexual mode of transmission: Secondary | ICD-10-CM

## 2016-02-11 DIAGNOSIS — B182 Chronic viral hepatitis C: Secondary | ICD-10-CM

## 2016-02-11 DIAGNOSIS — Z79899 Other long term (current) drug therapy: Secondary | ICD-10-CM | POA: Diagnosis not present

## 2016-02-11 DIAGNOSIS — F329 Major depressive disorder, single episode, unspecified: Secondary | ICD-10-CM

## 2016-02-11 DIAGNOSIS — B20 Human immunodeficiency virus [HIV] disease: Secondary | ICD-10-CM

## 2016-02-11 DIAGNOSIS — F32A Depression, unspecified: Secondary | ICD-10-CM

## 2016-02-11 DIAGNOSIS — R030 Elevated blood-pressure reading, without diagnosis of hypertension: Secondary | ICD-10-CM

## 2016-02-11 DIAGNOSIS — F1910 Other psychoactive substance abuse, uncomplicated: Secondary | ICD-10-CM

## 2016-02-11 LAB — CBC WITH DIFFERENTIAL/PLATELET
BASOS ABS: 0 {cells}/uL (ref 0–200)
Basophils Relative: 0 %
EOS PCT: 3 %
Eosinophils Absolute: 168 cells/uL (ref 15–500)
HCT: 35.4 % (ref 35.0–45.0)
Hemoglobin: 11.5 g/dL — ABNORMAL LOW (ref 11.7–15.5)
LYMPHS PCT: 39 %
Lymphs Abs: 2184 cells/uL (ref 850–3900)
MCH: 29.9 pg (ref 27.0–33.0)
MCHC: 32.5 g/dL (ref 32.0–36.0)
MCV: 92.2 fL (ref 80.0–100.0)
MONOS PCT: 11 %
MPV: 10.4 fL (ref 7.5–12.5)
Monocytes Absolute: 616 cells/uL (ref 200–950)
NEUTROS ABS: 2632 {cells}/uL (ref 1500–7800)
Neutrophils Relative %: 47 %
PLATELETS: 289 10*3/uL (ref 140–400)
RBC: 3.84 MIL/uL (ref 3.80–5.10)
RDW: 13.8 % (ref 11.0–15.0)
WBC: 5.6 10*3/uL (ref 3.8–10.8)

## 2016-02-11 MED ORDER — ALBUTEROL SULFATE HFA 108 (90 BASE) MCG/ACT IN AERS: 2.0000 | INHALATION_SPRAY | Freq: Four times a day (QID) | RESPIRATORY_TRACT | 2 refills | Status: DC | PRN

## 2016-02-11 MED ORDER — DIAZEPAM 5 MG PO TABS: 5.0000 mg | ORAL_TABLET | Freq: Every evening | ORAL | 5 refills | Status: DC | PRN

## 2016-02-11 NOTE — Assessment & Plan Note (Signed)
Stable on norvasc

## 2016-02-11 NOTE — BH Specialist Note (Signed)
Counselor met with Foster in the exam room for a warm hand off per Dr. Linus Salmons request.  Patient admitted that she is still using heroin and would like to get on Suboxone.  Patient was oriented times four with good affect and dress.  Patient did not seem sincere about wanting treatment.  Patient did communicate that she would like to get back on Suboxone but not sure she could do any type of meetings.  Counselor explained to patient that medication treats symptoms and therapy treated problems.  One must go with the other for success to likely. Counselor provided patient to additional resources for Suboxone clinics.  Patient shared that she would call and make an appointment with one tomorrow some time. Counselor encouraged patient to call as soon as possible and get seen. Patient said she would.  Rolena Infante, MA, LPC [facility]

## 2016-02-11 NOTE — Assessment & Plan Note (Signed)
Will check lipid panel. 

## 2016-02-11 NOTE — Assessment & Plan Note (Signed)
At this time, I will defer treatment until she can remain stable off of heroin.

## 2016-02-11 NOTE — Assessment & Plan Note (Signed)
Labs today and rtc 4 months unless concerns.  

## 2016-02-11 NOTE — Progress Notes (Signed)
Patient ID: Courtney Ellis, female   DOB: 04/05/1961, 55 y.o.   MRN: [id number] CC: Follow up for HIV  Interval history: Currently is asymptomatic and well-controlled on Prezcobix and Triumeq.  She though continues to be determined to remain drug free and has been able to continue working and actually got full-time hours, overtime.  Her kids still are doing well and good students.  She was on suboxone and in a program but was working 12 hour days and could not continue so did not continue on suboxone and therefore is still stuggling to remain drug-free.  Recent cough with congestion for 2 weeks.  Has hepatitis C.    Prior to Admission medications   Medication Sig Start Date End Date Taking? Authorizing Provider  Abacavir-Dolutegravir-Lamivud (TRIUMEQ) 600-50-300 MG TABS Take 1 tablet by mouth daily. 01/02/15  Yes Gardiner Barefootobert W Mame Twombly, MD  amLODipine (NORVASC) 5 MG tablet TAKE 1 TABLET (5 MG TOTAL) BY MOUTH DAILY. 04/18/15  Yes Gardiner Barefootobert W Keyana Guevara, MD  diazepam (VALIUM) 5 MG tablet Take 1 tablet (5 mg total) by mouth at bedtime as needed for anxiety. 06/19/15  Yes Gardiner Barefootobert W Muaz Shorey, MD  traZODone (DESYREL) 50 MG tablet Take 1 tablet (50 mg total) by mouth at bedtime. 06/22/14  Yes Gardiner Barefootobert W Ninamarie Keel, MD  darunavir-cobicistat (PREZCOBIX) 800-150 MG tablet Take 1 tablet by mouth daily. Swallow whole. Do NOT crush, break or chew tablets. Take with food. 08/21/15   Gardiner Barefootobert W Brynlynn Walko, MD    Review of Systems Constitutional: negative for fevers, chills and sweats Respiratory: positive for sputum, negative for hemoptysis Musculoskeletal: negative for myalgias and arthralgias All other systems reviewed and are negative   Physical Exam: CONSTITUTIONAL:in no apparent distress and alert  There were no vitals filed for this visit. Eyes: anicteric HENT: no thrush, no cervical lymphadenopathy Respiratory: Normal respiratory effort; CTA B CArdiovascular: RRR  Lab Results  Component Value Date   HIV1RNAQUANT <20 08/21/2015   HIV1RNAQUANT 67 (H) 05/31/2015   HIV1RNAQUANT <20 01/02/2015   No components found for: HIV1GENOTYPRPLUS No components found for: THELPERCELL   Social History   Social History  . Marital status: Widowed    Spouse name: N/A  . Number of children: N/A  . Years of education: N/A   Occupational History  . Not on file.   Social History Main Topics  . Smoking status: Current Every Day Smoker    Packs/day: 0.20    Types: Cigarettes  . Smokeless tobacco: Never Used     Comment: 3 cigarettes a day  . Alcohol use No  . Drug use:     Types: IV     Comment: heroin  . Sexual activity: Not Currently    Partners: Male     Comment: pt. declined condoms   Other Topics Concern  . Not on file   Social History Narrative  . No narrative on file   Trying to cut down on smoking.

## 2016-02-11 NOTE — Assessment & Plan Note (Signed)
Struggling, she was visited by our counselor.

## 2016-02-11 NOTE — Assessment & Plan Note (Signed)
Will check today

## 2016-02-12 LAB — COMPLETE METABOLIC PANEL WITH GFR
ALBUMIN: 3.1 g/dL — AB (ref 3.6–5.1)
ALK PHOS: 102 U/L (ref 33–130)
ALT: 25 U/L (ref 6–29)
AST: 34 U/L (ref 10–35)
BILIRUBIN TOTAL: 0.3 mg/dL (ref 0.2–1.2)
BUN: 11 mg/dL (ref 7–25)
CO2: 30 mmol/L (ref 20–31)
CREATININE: 0.77 mg/dL (ref 0.50–1.05)
Calcium: 9.4 mg/dL (ref 8.6–10.4)
Chloride: 103 mmol/L (ref 98–110)
GFR, Est African American: >89 mL/min (ref >=60)
GFR, Est Non African American: 88 mL/min (ref >=60)
GLUCOSE: 78 mg/dL (ref 65–99)
Potassium: 4.5 mmol/L (ref 3.5–5.3)
Sodium: 139 mmol/L (ref 135–146)
TOTAL PROTEIN: 7 g/dL (ref 6.1–8.1)

## 2016-02-12 LAB — T-HELPER CELL (CD4) - (RCID CLINIC ONLY)
CD4 % Helper T Cell: 14 % — ABNORMAL LOW (ref 33–55)
CD4 T Cell Abs: 320 /uL — ABNORMAL LOW (ref 400–2700)

## 2016-02-12 LAB — LIPID PANEL
CHOL/HDL RATIO: 3.2 ratio (ref <=5.0)
CHOLESTEROL: 178 mg/dL (ref 125–200)
HDL: 55 mg/dL (ref >=46)
LDL Cholesterol: 92 mg/dL (ref <130)
Triglycerides: 153 mg/dL — ABNORMAL HIGH (ref <150)
VLDL: 31 mg/dL — ABNORMAL HIGH (ref <30)

## 2016-02-12 LAB — RPR

## 2016-02-13 LAB — HIV-1 RNA QUANT-NO REFLEX-BLD
HIV 1 RNA Quant: <20 copies/mL (ref <20)
HIV-1 RNA Quant, Log: <1.3 Log copies/mL (ref <1.30)

## 2016-02-18 ENCOUNTER — Other Ambulatory Visit: Payer: Self-pay | Admitting: Internal Medicine

## 2016-02-20 ENCOUNTER — Encounter: Payer: Self-pay | Admitting: *Deleted

## 2016-02-22 ENCOUNTER — Ambulatory Visit: Payer: Medicaid Other

## 2016-05-06 ENCOUNTER — Other Ambulatory Visit: Payer: Self-pay | Admitting: Internal Medicine

## 2016-05-16 DIAGNOSIS — H538 Other visual disturbances: Secondary | ICD-10-CM | POA: Diagnosis not present

## 2016-05-16 DIAGNOSIS — H2511 Age-related nuclear cataract, right eye: Secondary | ICD-10-CM | POA: Diagnosis not present

## 2016-05-23 ENCOUNTER — Other Ambulatory Visit: Payer: Self-pay | Admitting: Internal Medicine

## 2016-05-23 DIAGNOSIS — J302 Other seasonal allergic rhinitis: Secondary | ICD-10-CM

## 2016-06-06 ENCOUNTER — Ambulatory Visit: Payer: Medicaid Other

## 2016-06-12 ENCOUNTER — Ambulatory Visit (INDEPENDENT_AMBULATORY_CARE_PROVIDER_SITE_OTHER): Payer: Medicaid Other | Admitting: Internal Medicine

## 2016-06-12 ENCOUNTER — Encounter: Payer: Self-pay | Admitting: Internal Medicine

## 2016-06-12 VITALS — BP 106/71 | HR 68 | Temp 97.9°F | Ht 63.0 in | Wt 138.0 lb

## 2016-06-12 DIAGNOSIS — F1910 Other psychoactive substance abuse, uncomplicated: Secondary | ICD-10-CM | POA: Diagnosis not present

## 2016-06-12 DIAGNOSIS — I10 Essential (primary) hypertension: Secondary | ICD-10-CM

## 2016-06-12 DIAGNOSIS — F5105 Insomnia due to other mental disorder: Secondary | ICD-10-CM

## 2016-06-12 DIAGNOSIS — F99 Mental disorder, not otherwise specified: Secondary | ICD-10-CM | POA: Diagnosis not present

## 2016-06-12 DIAGNOSIS — B20 Human immunodeficiency virus [HIV] disease: Secondary | ICD-10-CM

## 2016-06-12 LAB — COMPLETE METABOLIC PANEL WITH GFR
ALT: 20 U/L (ref 6–29)
AST: 31 U/L (ref 10–35)
Albumin: 4.1 g/dL (ref 3.6–5.1)
Alkaline Phosphatase: 87 U/L (ref 33–130)
BUN: 15 mg/dL (ref 7–25)
CALCIUM: 10.2 mg/dL (ref 8.6–10.4)
CHLORIDE: 101 mmol/L (ref 98–110)
CO2: 27 mmol/L (ref 20–31)
Creat: 0.88 mg/dL (ref 0.50–1.05)
GFR, EST NON AFRICAN AMERICAN: 75 mL/min (ref >=60)
GFR, Est African American: 86 mL/min (ref >=60)
Glucose, Bld: 91 mg/dL (ref 65–99)
POTASSIUM: 6.1 mmol/L — AB (ref 3.5–5.3)
SODIUM: 135 mmol/L (ref 135–146)
Total Bilirubin: 0.5 mg/dL (ref 0.2–1.2)
Total Protein: 8.8 g/dL — ABNORMAL HIGH (ref 6.1–8.1)

## 2016-06-12 MED ORDER — TRAZODONE HCL 100 MG PO TABS: 100.0000 mg | ORAL_TABLET | Freq: Every day | ORAL | 5 refills | Status: DC

## 2016-06-12 NOTE — Assessment & Plan Note (Signed)
Good bp on norvasc

## 2016-06-12 NOTE — Assessment & Plan Note (Signed)
Doing well with this.  Labs today and rtc 4 months 

## 2016-06-12 NOTE — Assessment & Plan Note (Signed)
Still struggling with this and she was seen by our counselor and will follow up with him.

## 2016-06-12 NOTE — Progress Notes (Signed)
CC: Follow up for HIV  Interval history: she continues on Prezcobix and Triumeq and is taking daily with no missed doses.  Her biggest issue continues to be her struggles with depression and heroin use.  She recently relapsed with heroin but fortunately after using, did not notice withdrawal symptoms and has not been using.  She is having a lot of issues sleeping.  She is tearful today.  Having difficulty finding a full-time job  Prior to Admission medications   Medication Sig Start Date End Date Taking? Authorizing Provider  amLODipine (NORVASC) 5 MG tablet TAKE 1 TABLET (5 MG TOTAL) BY MOUTH DAILY. 01/18/16  Yes Gardiner Barefootobert W Comer, MD  cetirizine (ZYRTEC) 10 MG tablet TAKE 1 CAPSULE (10 MG TOTAL) BY MOUTH DAILY. 05/23/16  Yes Gardiner Barefootobert W Comer, MD  diazepam (VALIUM) 5 MG tablet Take 1 tablet (5 mg total) by mouth at bedtime as needed for anxiety. 02/11/16  Yes Gardiner Barefootobert W Comer, MD  PREZCOBIX 800-150 MG tablet TAKE 1 TABLET BY MOUTH DAILY. SWALLOW WHOLE. DO NOT CRUSH, BREAK OR CHEW TABLETS. TAKE WITH FOOD. 02/18/16  Yes Gardiner Barefootobert W Comer, MD  PROVENTIL HFA 108 [phone](90 Base) MCG/ACT inhaler INHALE TWO PUFFS INTO LUNGS EVERY SIX HOURS AS NEEDED FOR WHEEZING OR SHORTNESS OF BREATH 05/06/16  Yes Gardiner Barefootobert W Comer, MD  TRIUMEQ 600-50-300 MG tablet TAKE 1 TABLET BY MOUTH DAILY. 01/18/16  Yes Gardiner Barefootobert W Comer, MD  traZODone (DESYREL) 100 MG tablet Take 1 tablet (100 mg total) by mouth at bedtime. 06/12/16   Gardiner Barefootobert W Comer, MD    Review of Systems Constitutional: negative for fatigue and malaise Gastrointestinal: negative for diarrhea Integument/breast: negative for rash Behavioral/Psych: positive for depression and sleep disturbance, negative for SI All other systems reviewed and are negative    Physical Exam: CONSTITUTIONAL:in no apparent distress  Vitals:   06/12/16 0930  BP: 106/71  Pulse: 68  Temp: 97.9 F (36.6 C)   Eyes: anicteric HENT: no thrush, no cervical lymphadenopathy Respiratory: Normal respiratory  effort; CTA B Cardiovascular: RRR  Lab Results  Component Value Date   HIV1RNAQUANT <20 02/11/2016   HIV1RNAQUANT <20 08/21/2015   HIV1RNAQUANT 67 (H) 05/31/2015   No components found for: HIV1GENOTYPRPLUS No components found for: THELPERCELL  SH: recent heroin use, + tobacco

## 2016-06-12 NOTE — Assessment & Plan Note (Signed)
Will try 100 mg trazodone to help her sleep

## 2016-06-13 ENCOUNTER — Telehealth: Payer: Self-pay | Admitting: *Deleted

## 2016-06-13 ENCOUNTER — Ambulatory Visit: Payer: Medicaid Other

## 2016-06-13 LAB — T-HELPER CELL (CD4) - (RCID CLINIC ONLY)
CD4 % Helper T Cell: 13 % — ABNORMAL LOW (ref 33–55)
CD4 T Cell Abs: 350 /uL — ABNORMAL LOW (ref 400–2700)

## 2016-06-13 NOTE — Telephone Encounter (Signed)
Left message for her to come a little early for repeat blood work on Tuesday.  Asked her to call us or go to the ED if she develops any symptoms. Andree CossHowell, Naw Lasala M, RN

## 2016-06-13 NOTE — Telephone Encounter (Signed)
-----   Message from Gardiner Barefootobert W Comer, MD sent at 06/13/2016 10:45 AM EST ----- When she comes in to see Bernette RedbirdKenny on Tuesday, can we recheck a bmp to be sure her K is not still high? thanks

## 2016-06-13 NOTE — Telephone Encounter (Signed)
No Show for PAP smear appt.  Requested that patient call for a new appt.

## 2016-06-16 LAB — HIV-1 RNA QUANT-NO REFLEX-BLD
HIV 1 RNA QUANT: DETECTED {copies}/mL — AB
HIV-1 RNA Quant, Log: <1.3 Log copies/mL — AB

## 2016-06-17 ENCOUNTER — Ambulatory Visit: Payer: Medicaid Other

## 2016-06-17 ENCOUNTER — Other Ambulatory Visit: Payer: Medicaid Other

## 2016-06-17 DIAGNOSIS — F4323 Adjustment disorder with mixed anxiety and depressed mood: Secondary | ICD-10-CM

## 2016-06-17 NOTE — BH Specialist Note (Signed)
I met with Courtney Ellis today after having had a "warm handoff" with her last week in the exam room. She was late for her appointment and was sleepy during the session, but responded well to learning about cognitive distortions, identifying several on Burns' checklist. I also gave her a handout on setting limits after she talked about how she has looked after her siblings all these years at her expense. We plan to meet again in one week and I told her I would address her anxiety issues.  Curley Spice, LCSW

## 2016-06-18 ENCOUNTER — Telehealth: Payer: Self-pay | Admitting: *Deleted

## 2016-06-18 DIAGNOSIS — E875 Hyperkalemia: Secondary | ICD-10-CM

## 2016-06-18 NOTE — Telephone Encounter (Signed)
-----   Message from Gardiner Barefootobert W Comer, MD sent at 06/17/2016  7:47 PM EST ----- Looks like she did not get a repeat K today.  Can we get her back for one soon? thanks

## 2016-06-18 NOTE — Telephone Encounter (Signed)
Needing repeat BMP due to potassium issue.  Patient able to come tomorrow for labs.  RN will notify Dr. Luciana Axeomer.

## 2016-06-19 ENCOUNTER — Other Ambulatory Visit: Payer: Medicaid Other

## 2016-06-19 ENCOUNTER — Telehealth: Payer: Self-pay | Admitting: *Deleted

## 2016-06-19 DIAGNOSIS — E875 Hyperkalemia: Secondary | ICD-10-CM

## 2016-06-19 LAB — BASIC METABOLIC PANEL
BUN: 9 mg/dL (ref 7–25)
CO2: 26 mmol/L (ref 20–31)
Calcium: 9.6 mg/dL (ref 8.6–10.4)
Chloride: 103 mmol/L (ref 98–110)
Creat: 0.74 mg/dL (ref 0.50–1.05)
GLUCOSE: 97 mg/dL (ref 65–99)
Potassium: 4 mmol/L (ref 3.5–5.3)
SODIUM: 138 mmol/L (ref 135–146)

## 2016-06-19 NOTE — Telephone Encounter (Signed)
Walk-in to [facility] - question about altered blood potassium level.  Patient came in to [facility] for follow-up lab work ordered by Dr. Luciana Axeomer.  Patient's blooed potassium level was elevated when recently drawn.  Patient requested information on why this may have happened.  RN explained that there were several reasons why this may have happened.  One of the most common is dehydration.  RN explained that the chemicals in the body are responsive to the amount of liquid that is consumed and that each of the chemicals in the body combine to make the heart, kidneys and brain.  Patient did say that she did not drink enough during the day and that her drug problem may impact her fluid and food intake.  She shared that she is trying to do better about her drug problem and that she would try to increase her fluid intake throughout the day.

## 2016-06-26 ENCOUNTER — Ambulatory Visit: Payer: Medicaid Other

## 2016-06-27 ENCOUNTER — Encounter (HOSPITAL_COMMUNITY)
Admission: AD | Disposition: A | Discharge status: Home / Self Care | Payer: Self-pay | Source: Ambulatory Visit | Attending: Obstetrics & Gynecology

## 2016-06-27 ENCOUNTER — Inpatient Hospital Stay (HOSPITAL_COMMUNITY): Payer: Medicaid Other | Admitting: Anesthesiology

## 2016-06-27 ENCOUNTER — Encounter (HOSPITAL_COMMUNITY): Payer: Self-pay | Admitting: Anesthesiology

## 2016-06-27 ENCOUNTER — Ambulatory Visit: Admit: 2016-06-27 | Payer: Medicaid Other | Admitting: Obstetrics & Gynecology

## 2016-06-27 ENCOUNTER — Encounter (HOSPITAL_COMMUNITY): Payer: Self-pay | Admitting: *Deleted

## 2016-06-27 ENCOUNTER — Ambulatory Visit: Payer: Medicaid Other

## 2016-06-27 ENCOUNTER — Ambulatory Visit (HOSPITAL_COMMUNITY)
Admission: AD | Admit: 2016-06-27 | Discharge: 2016-06-27 | Disposition: A | Discharge status: Home / Self Care | Payer: Medicaid Other | Source: Ambulatory Visit | Attending: Obstetrics & Gynecology | Admitting: Obstetrics & Gynecology

## 2016-06-27 DIAGNOSIS — F329 Major depressive disorder, single episode, unspecified: Secondary | ICD-10-CM | POA: Diagnosis not present

## 2016-06-27 DIAGNOSIS — Z79899 Other long term (current) drug therapy: Secondary | ICD-10-CM | POA: Diagnosis not present

## 2016-06-27 DIAGNOSIS — T192XXA Foreign body in vulva and vagina, initial encounter: Secondary | ICD-10-CM | POA: Diagnosis not present

## 2016-06-27 DIAGNOSIS — F17210 Nicotine dependence, cigarettes, uncomplicated: Secondary | ICD-10-CM | POA: Diagnosis not present

## 2016-06-27 DIAGNOSIS — X58XXXA Exposure to other specified factors, initial encounter: Secondary | ICD-10-CM | POA: Insufficient documentation

## 2016-06-27 DIAGNOSIS — I10 Essential (primary) hypertension: Secondary | ICD-10-CM | POA: Diagnosis not present

## 2016-06-27 DIAGNOSIS — F419 Anxiety disorder, unspecified: Secondary | ICD-10-CM | POA: Diagnosis not present

## 2016-06-27 DIAGNOSIS — Z21 Asymptomatic human immunodeficiency virus [HIV] infection status: Secondary | ICD-10-CM | POA: Insufficient documentation

## 2016-06-27 DIAGNOSIS — R102 Pelvic and perineal pain: Secondary | ICD-10-CM

## 2016-06-27 DIAGNOSIS — Z113 Encounter for screening for infections with a predominantly sexual mode of transmission: Secondary | ICD-10-CM

## 2016-06-27 HISTORY — DX: Anxiety disorder, unspecified: F41.9

## 2016-06-27 HISTORY — DX: Depression, unspecified: F32.A

## 2016-06-27 HISTORY — DX: Major depressive disorder, single episode, unspecified: F32.9

## 2016-06-27 HISTORY — DX: Inflammatory liver disease, unspecified: K75.9

## 2016-06-27 HISTORY — PX: DILATION AND CURETTAGE OF UTERUS: SHX78

## 2016-06-27 LAB — BASIC METABOLIC PANEL
Anion gap: 8 (ref 5–15)
BUN: 18 mg/dL (ref 6–20)
CALCIUM: 9.6 mg/dL (ref 8.9–10.3)
CHLORIDE: 103 mmol/L (ref 101–111)
CO2: 25 mmol/L (ref 22–32)
CREATININE: 0.86 mg/dL (ref 0.44–1.00)
GFR calc Af Amer: >60 mL/min (ref >60)
GFR calc non Af Amer: >60 mL/min (ref >60)
GLUCOSE: 95 mg/dL (ref 65–99)
Potassium: 4.2 mmol/L (ref 3.5–5.1)
Sodium: 136 mmol/L (ref 135–145)

## 2016-06-27 LAB — RAPID URINE DRUG SCREEN, HOSP PERFORMED
AMPHETAMINES: NOT DETECTED
Barbiturates: NOT DETECTED
Benzodiazepines: POSITIVE — AB
Cocaine: NOT DETECTED
Opiates: POSITIVE — AB
Tetrahydrocannabinol: NOT DETECTED

## 2016-06-27 LAB — CBC
HCT: 38.9 % (ref 36.0–46.0)
Hemoglobin: 12.8 g/dL (ref 12.0–15.0)
MCH: 30 pg (ref 26.0–34.0)
MCHC: 32.9 g/dL (ref 30.0–36.0)
MCV: 91.1 fL (ref 78.0–100.0)
PLATELETS: 260 10*3/uL (ref 150–400)
RBC: 4.27 MIL/uL (ref 3.87–5.11)
RDW: 13.5 % (ref 11.5–15.5)
WBC: 7.1 10*3/uL (ref 4.0–10.5)

## 2016-06-27 SURGERY — EXAM UNDER ANESTHESIA
Anesthesia: Choice

## 2016-06-27 SURGERY — DILATION AND CURETTAGE
Anesthesia: General | Site: Vagina

## 2016-06-27 MED ORDER — PROPOFOL 10 MG/ML IV BOLUS
INTRAVENOUS | Status: DC | PRN
  Administered 2016-06-27: 180 mg via INTRAVENOUS

## 2016-06-27 MED ORDER — MIDAZOLAM HCL 2 MG/2ML IJ SOLN
INTRAMUSCULAR | Status: DC | PRN
  Administered 2016-06-27: 2 mg via INTRAVENOUS

## 2016-06-27 MED ORDER — ONDANSETRON HCL 4 MG/2ML IJ SOLN
INTRAMUSCULAR | Status: AC
  Filled 2016-06-27: qty 2

## 2016-06-27 MED ORDER — FENTANYL CITRATE (PF) 100 MCG/2ML IJ SOLN: 25.0000 ug | INTRAMUSCULAR | Status: DC | PRN

## 2016-06-27 MED ORDER — PROPOFOL 10 MG/ML IV BOLUS
INTRAVENOUS | Status: AC
  Filled 2016-06-27: qty 20

## 2016-06-27 MED ORDER — KETOROLAC TROMETHAMINE 30 MG/ML IM SOLN
30.0000 mg | Freq: Once | INTRAMUSCULAR | Status: AC
  Administered 2016-06-27: 30 mg via INTRAMUSCULAR

## 2016-06-27 MED ORDER — LIDOCAINE HCL (CARDIAC) 20 MG/ML IV SOLN
INTRAVENOUS | Status: AC
  Filled 2016-06-27: qty 5

## 2016-06-27 MED ORDER — DEXAMETHASONE SODIUM PHOSPHATE 10 MG/ML IJ SOLN
INTRAMUSCULAR | Status: DC | PRN
  Administered 2016-06-27: 4 mg via INTRAVENOUS

## 2016-06-27 MED ORDER — FENTANYL CITRATE (PF) 100 MCG/2ML IJ SOLN
INTRAMUSCULAR | Status: DC | PRN
  Administered 2016-06-27: 100 ug via INTRAVENOUS
  Administered 2016-06-27: 50 ug via INTRAVENOUS

## 2016-06-27 MED ORDER — FAMOTIDINE IN NACL 20-0.9 MG/50ML-% IV SOLN
20.0000 mg | Freq: Once | INTRAVENOUS | Status: AC
  Administered 2016-06-27: 20 mg via INTRAVENOUS
  Filled 2016-06-27: qty 50

## 2016-06-27 MED ORDER — LACTATED RINGERS IV SOLN
INTRAVENOUS | Status: DC
  Administered 2016-06-27: 17:00:00 via INTRAVENOUS

## 2016-06-27 MED ORDER — FENTANYL CITRATE (PF) 100 MCG/2ML IJ SOLN
INTRAMUSCULAR | Status: AC
  Filled 2016-06-27: qty 2

## 2016-06-27 MED ORDER — LIDOCAINE HCL (CARDIAC) 20 MG/ML IV SOLN
INTRAVENOUS | Status: DC | PRN
  Administered 2016-06-27: 40 mg via INTRAVENOUS

## 2016-06-27 MED ORDER — MEPERIDINE HCL 25 MG/ML IJ SOLN: 6.2500 mg | INTRAMUSCULAR | Status: DC | PRN

## 2016-06-27 MED ORDER — METOCLOPRAMIDE HCL 5 MG/ML IJ SOLN: 10.0000 mg | Freq: Once | INTRAMUSCULAR | Status: DC | PRN

## 2016-06-27 MED ORDER — OXYCODONE HCL 5 MG PO TABS: 5.0000 mg | ORAL_TABLET | Freq: Once | ORAL | Status: DC | PRN

## 2016-06-27 MED ORDER — MIDAZOLAM HCL 2 MG/2ML IJ SOLN
INTRAMUSCULAR | Status: AC
  Filled 2016-06-27: qty 2

## 2016-06-27 MED ORDER — OXYCODONE HCL 5 MG/5ML PO SOLN: 5.0000 mg | Freq: Once | ORAL | Status: DC | PRN

## 2016-06-27 SURGICAL SUPPLY — 21 items
CATH ROBINSON RED A/P 16FR (CATHETERS) ×3 IMPLANT
CLOTH BEACON ORANGE TIMEOUT ST (SAFETY) ×3 IMPLANT
DECANTER SPIKE VIAL GLASS SM (MISCELLANEOUS) ×1 IMPLANT
GLOVE BIO SURGEON STRL SZ 6.5 (GLOVE) ×2 IMPLANT
GLOVE BIO SURGEONS STRL SZ 6.5 (GLOVE) ×1
GLOVE BIOGEL PI IND STRL 6.5 (GLOVE) ×1 IMPLANT
GLOVE BIOGEL PI IND STRL 7.0 (GLOVE) ×1 IMPLANT
GLOVE BIOGEL PI INDICATOR 6.5 (GLOVE) ×2
GLOVE BIOGEL PI INDICATOR 7.0 (GLOVE) ×2
GOWN STRL REUS W/TWL LRG LVL3 (GOWN DISPOSABLE) ×6 IMPLANT
KIT BERKELEY 1ST TRIMESTER 3/8 (MISCELLANEOUS) IMPLANT
NS IRRIG 1000ML POUR BTL (IV SOLUTION) ×3 IMPLANT
PACK VAGINAL MINOR WOMEN LF (CUSTOM PROCEDURE TRAY) ×3 IMPLANT
PAD OB MATERNITY 4.3X12.25 (PERSONAL CARE ITEMS) ×3 IMPLANT
PAD PREP 24X48 CUFFED NSTRL (MISCELLANEOUS) ×3 IMPLANT
SET BERKELEY SUCTION TUBING (SUCTIONS) ×1 IMPLANT
TOWEL OR 17X24 6PK STRL BLUE (TOWEL DISPOSABLE) ×6 IMPLANT
VACURETTE 10 RIGID CVD (CANNULA) IMPLANT
VACURETTE 7MM CVD STRL WRAP (CANNULA) IMPLANT
VACURETTE 8 RIGID CVD (CANNULA) IMPLANT
VACURETTE 9 RIGID CVD (CANNULA) IMPLANT

## 2016-06-27 NOTE — Op Note (Signed)
Preoperative diagnosis: Vaginal foreign object  Postoperative diagnosis: Same  Anesthesia: General  Procedure: Exam under anesthesia, Removal of foreign objects  Surgeon: Dr. Myna HidalgoJennifer Lynisha Osuch  Estimated blood loss: Minimal Specimen: pap  Procedure: Pt was placed in the lithotomy position and general anesthesia was used. She was then prepped and draped in a sterile  fashion and her bladder is emptied with an in and out red rubber catheter.  Plastic device visible at introitus.  Upon closer inspection, the plastic device either lacerated through a portion of the vagina or it grew around the device.  Scissors were used to cut the device and it was removed in its entirety.  No vaginal bleeding was noted.  The vagina vault was examined a small mucosal tag was noted at the 3 o'clock position- benign appearing.  Pap smear was completed- normal appearing cervix.  No other abnormalities were seen.  All instruments were removed. Instrument and sponge counts were correct. Estimated blood loss is minimal.  Pt tolerated procedure well without complications and taken to recovery.  Myna HidalgoJennifer Ressie Slevin, DO [phone] (pager) [phone] (office)

## 2016-06-27 NOTE — Anesthesia Postprocedure Evaluation (Signed)
Anesthesia Post Note  Patient: Courtney Ellis  Procedure(s) Performed: Procedure(s) (LRB): Removal Foreighn Body Vagina (N/A)  Patient location during evaluation: PACU Anesthesia Type: General Level of consciousness: awake and alert and oriented Pain management: pain level controlled Vital Signs Assessment: post-procedure vital signs reviewed and stable Respiratory status: spontaneous breathing, nonlabored ventilation and respiratory function stable Cardiovascular status: blood pressure returned to baseline and stable Postop Assessment: no signs of nausea or vomiting Anesthetic complications: no        Last Vitals:  Vitals:   06/27/16 1900 06/27/16 1915  BP: 140/88 (!) 149/86  Pulse: (!) 59   Resp: 12   Temp:  36.2 C    Last Pain:  Vitals:   06/27/16 1915  TempSrc:   PainSc: 0-No pain   Pain Goal: Patients Stated Pain Goal: 4 (06/27/16 1915)               Hezekiah Veltre A.

## 2016-06-27 NOTE — H&P (Signed)
56yo PM female who presents for exam under anesthesia due to foreign vaginal object.  Pt presented to me from Dr. Gladstone Lighter office due to a foreign object within the vaginal canal. They were trying to complete a pap smear when they noted this object and were unable to remove it. The patient does report that 3 weeks ago while having sex, her partner put in something, but she did not realize it wasn't removed. Since that time she has noted a foul odor and slight discharge. Denies pelvic or abdominal pain.   Current Medications  Taking   Norvasc(AmLODIPine Besylate) 5 MG Tablet 1 tablet Orally Once a day   ZyrTEC(Cetirizine HCl) 10mg  tablet one orally daily   Valium(DiazePAM) 5 MG Tablet 1 tablet as needed Orally Twice a day   Prezcobix(Darunavir-Cobicistat) 800-150 MG Tablet 1 tablet with food Orally Once a day   Triumeq(Abacavir-Dolutegravir-Lamivud) 600-50-300 MG Tablet 1 tablet Orally Once a day   Trazodone HCl 100 MG Tablet 1 tablet at bedtime Orally Once a day   Proventil HFA(Albuterol Sulfate HFA) 108 (90 Base) MCG/ACT Aerosol Solution 2 puffs as needed Inhalation every 6 hrs   Medication List reviewed and reconciled with the patient    Past Medical History  HIV.   Drug abuse .           Surgical History  c-section    Family History  Father: deceased, stomach cancer  Mother: deceased, cancer  Paternal aunt: diagnosed with Diabetes, Ovarian cancer   Social History  General:  Tobacco use  cigarettes: Current smoker Frequency: intermittent smoker Tobacco history last updated 06/27/2016 no Alcohol.  Recreational drug use: yes, yes, heroin.  Marital Status: widowed.    Gyn History  Sexual activity currently sexually active.  Periods : postmenopausal.  LMP 7 years ago.  Denies H/O Birth control.  Last pap smear date unsure.  Last mammogram date within the last two years.  STD HIV.    OB History  Pregnancy # 1 live birth, vaginal delivery.  Pregnancy # 2 live  birth, vaginal delivery.  Pregnancy # 3 live birth, C-section.    Allergies  N.K.D.A.   Hospitalization/Major Diagnostic Procedure  No Hospitalization History.   Review of Systems  CONSTITUTIONAL:  no Chills. Fatigue yes. no Fever. Night sweats yes.  HEENT:  Blurrred vision no. no Double vision.  CARDIOLOGY:  no Chest pain.  RESPIRATORY:  no Shortness of breath. no Cough.  UROLOGY:  no Urinary frequency. no Urinary incontinence. no Urinary urgency.  GASTROENTEROLOGY:  no Abdominal pain. Appetite change yes. no Change in bowel movements. Nausea yes.  FEMALE REPRODUCTIVE:  no Breast lumps or discharge. no Breast pain.  NEUROLOGY:  no Dizziness. no Headache. no Loss of consciousness.  PSYCHOLOGY:  Anxiety yes. Depression yes.  SKIN:  no Rash. no Hives.  HEMATOLOGY/LYMPH:  no Anemia. Using Blood Thinners no.     Examination  General Examination: GENERAL APPEARANCE no acute distress.  SKIN: warm and dry, no rashes .  NECK: supple, normal appearance .  LUNGS: regular breathing rate and effort .  HEART: no murmurs, regular rate and rhythm.  ABDOMEN: soft, non-tender, no rebound or guarding.  FEMALE GENITOURINARY: normal external genitalia, visible plastic device at introitus- unable to remove with gentle traction, speculum placed- device appears suctioned to posteiror left vaginal side wall, cervix - no discharge or lesions or CMT.  MUSCULOSKELETAL no calf tenderness bilaterally .  EXTREMITIES: no edema present .  PSYCH: appropriate mood and affect .  A/P: 56yo PM female for exam under anesthesia and removal of foreign object -NPO -LR @ 125cc/hr -SCDs to OR -Risk/benefits and alternatives reviewed with patient and pt wishes to proceed. []  Urine tox to be completed- pt reports last drug use ~ 2 days ago  Myna HidalgoJennifer Jarrin Staley, DO [phone] (pager) [phone] (office)

## 2016-06-27 NOTE — Discharge Instructions (Addendum)
HOME INSTRUCTIONS  Please note any unusual or excessive bleeding, pain, swelling. Mild dizziness or drowsiness are normal for about 24 hours after surgery.   Shower when comfortable  Restrictions: No driving for 24 hours or while taking pain medications.  Activity:  Nothing in the vagina for one week.    Diet:  You may return to your regular diet.  Do not eat large meals.  Eat small frequent meals throughout the day.  Continue to drink a good amount of water at least 6-8 glasses of water per day, hydration is very important for the healing process.  Always take prescription pain medication with food, it may cause constipation, increase fluids and fiber and you may want to take an over-the-counter stool softener like Colace as needed up to 2x a day.    Alcohol -- Avoid for 24 hours and while taking pain medications.  Nausea: Take sips of ginger ale or soda  Fever -- Call physician if temperature over 101 degrees  Post Anesthesia Home Care Instructions  Activity: Get plenty of rest for the remainder of the day. A responsible adult should stay with you for 24 hours following the procedure.  For the next 24 hours, DO NOT: -Drive a car -Advertising copywriterperate machinery -Drink alcoholic beverages -Take any medication unless instructed by your physician -Make any legal decisions or sign important papers.  Meals: Start with liquid foods such as gelatin or soup. Progress to regular foods as tolerated. Avoid greasy, spicy, heavy foods. If nausea and/or vomiting occur, drink only clear liquids until the nausea and/or vomiting subsides. Call your physician if vomiting continues.  Special Instructions/Symptoms: Your throat may feel dry or sore from the anesthesia or the breathing tube placed in your throat during surgery. If this causes discomfort, gargle with warm salt water. The discomfort should disappear within 24 hours.

## 2016-06-27 NOTE — Progress Notes (Unsigned)
Subjective:     Courtney Ellis is a 56 y.o. woman who comes in today for a  pap smear only.  Previous abnormal Pap smears: no. Contraception: condoms.  Foreign object observed in vagina.  RN asked Dr. Drue SecondSnider to help in removing the object.  Patient reports yellow, odiferous vaginal discharge.  Objective:    LMP 09/08/2011  Pelvic Exam:  Pap smear obtained. PAP smear not obtained.  [facility] [address] Dr. Drue SecondSnider  Assessment:    Screening pap smear.   Plan:   Will [facility].

## 2016-06-27 NOTE — MAU Note (Signed)
Patient states she has something in her vagina. She has noticed an odder. She is also having yellowish discharge.

## 2016-06-27 NOTE — Anesthesia Preprocedure Evaluation (Signed)
Anesthesia Evaluation  Patient identified by MRN, date of birth, ID band Patient awake    Reviewed: Allergy & Precautions, NPO status , Patient's Chart, lab work & pertinent test results  Airway Mallampati: I       Dental no notable dental hx.    Pulmonary Current Smoker,    Pulmonary exam normal        Cardiovascular hypertension, Pt. on medications Normal cardiovascular exam     Neuro/Psych PSYCHIATRIC DISORDERS Anxiety Depression negative neurological ROS     GI/Hepatic (+)     substance abuse  IV drug use,   Endo/Other  negative endocrine ROS  Renal/GU   Female GU complaint     Musculoskeletal negative musculoskeletal ROS (+)   Abdominal Normal abdominal exam  (+)   Peds  Hematology negative hematology ROS (+)   Anesthesia Other Findings   Reproductive/Obstetrics negative OB ROS                             Anesthesia Physical Anesthesia Plan  ASA: II  Anesthesia Plan: General   Post-op Pain Management:    Induction: Intravenous  Airway Management Planned: LMA  Additional Equipment:   Intra-op Plan:   Post-operative Plan:   Informed Consent: I have reviewed the patients History and Physical, chart, labs and discussed the procedure including the risks, benefits and alternatives for the proposed anesthesia with the patient or authorized representative who has indicated his/her understanding and acceptance.     Plan Discussed with: CRNA and Surgeon  Anesthesia Plan Comments:         Anesthesia Quick Evaluation

## 2016-06-27 NOTE — Transfer of Care (Signed)
Immediate Anesthesia Transfer of Care Note  Patient: Courtney Ellis  Procedure(s) Performed: Procedure(s): Removal Teacher, early years/preoreighn Body Vagina (N/A)  Patient Location: PACU  Anesthesia Type:General  Level of Consciousness: awake, oriented, sedated and patient cooperative  Airway & Oxygen Therapy: Patient connected to nasal cannula oxygen  Post-op Assessment: Report given to RN and Post -op Vital signs reviewed and stable  Post vital signs: Reviewed and stable  Last Vitals:  Vitals:   06/27/16 1531  BP: 120/86  Pulse: 85  Resp: 16  Temp: 36.8 C    Last Pain:  Vitals:   06/27/16 1531  TempSrc: Oral         Complications: No apparent anesthesia complications

## 2016-06-30 ENCOUNTER — Encounter (HOSPITAL_COMMUNITY): Payer: Self-pay | Admitting: Obstetrics & Gynecology

## 2016-07-07 ENCOUNTER — Ambulatory Visit: Payer: Medicaid Other

## 2016-07-08 LAB — CYTOLOGY - PAP
Diagnosis: UNDETERMINED — AB
HPV: NOT DETECTED

## 2016-07-09 ENCOUNTER — Telehealth: Payer: Self-pay | Admitting: *Deleted

## 2016-07-09 NOTE — Telephone Encounter (Signed)
Patient is doing well after surgery on 06/27/16.  No vaginal discharge or drainage.  PAP smear completed 06/27/16 by GYN MD.  Results in EPIC.

## 2016-07-16 ENCOUNTER — Telehealth: Payer: Self-pay | Admitting: *Deleted

## 2016-07-16 ENCOUNTER — Other Ambulatory Visit: Payer: Self-pay | Admitting: *Deleted

## 2016-07-16 ENCOUNTER — Ambulatory Visit: Payer: Medicaid Other

## 2016-07-16 DIAGNOSIS — F411 Generalized anxiety disorder: Secondary | ICD-10-CM

## 2016-07-16 DIAGNOSIS — F331 Major depressive disorder, recurrent, moderate: Secondary | ICD-10-CM

## 2016-07-16 DIAGNOSIS — F1410 Cocaine abuse, uncomplicated: Secondary | ICD-10-CM

## 2016-07-16 MED ORDER — CETIRIZINE HCL 10 MG PO TABS: 10.0000 mg | ORAL_TABLET | Freq: Every day | ORAL | 2 refills | Status: DC

## 2016-07-16 NOTE — BH Specialist Note (Signed)
Courtney Ellis was in a fairly pleasant mood today, reporting that the trazadone is helping with sleep, though she still has nights when she ruminates. She admitted to using cocaine 2 days ago, but said she is not using every day. She continues to complain of anxiety and depression. I provided psycho-education on mindfulness meditation and on ways to improve sleep. She responded very well to this. Plan to meet again in one week. Franne FortsKenny Erick Murin, LCSW

## 2016-07-16 NOTE — Telephone Encounter (Signed)
Refill request for Valium from [facility]. This was d/c after discharge from hospital and is no longer on med list. Previously given by Dr. Luciana Axeomer, please advise. Wendall MolaJacqueline Jabari Swoveland

## 2016-07-21 ENCOUNTER — Other Ambulatory Visit: Payer: Self-pay | Admitting: *Deleted

## 2016-07-21 MED ORDER — DIAZEPAM 5 MG PO TABS: 5.0000 mg | ORAL_TABLET | Freq: Every evening | ORAL | 1 refills | Status: DC | PRN

## 2016-07-21 NOTE — Telephone Encounter (Signed)
Ok to refill   thanks

## 2016-07-21 NOTE — Telephone Encounter (Signed)
Rx called to pharmacy

## 2016-07-23 ENCOUNTER — Ambulatory Visit: Payer: Medicaid Other

## 2016-07-29 ENCOUNTER — Ambulatory Visit (INDEPENDENT_AMBULATORY_CARE_PROVIDER_SITE_OTHER): Payer: Medicaid Other

## 2016-07-29 DIAGNOSIS — F331 Major depressive disorder, recurrent, moderate: Secondary | ICD-10-CM

## 2016-07-29 DIAGNOSIS — F1110 Opioid abuse, uncomplicated: Secondary | ICD-10-CM

## 2016-07-29 NOTE — BH Specialist Note (Signed)
Courtney Ellis reported that she is 3 days clean from heroin. She said she had a scare this weekend when her sister accidentally overdosed and she talked to her about it. Esra said it was a "wake up call" for her and she wants to quit for good. She also said she has signed up for classes at [employer] (online). She wants to major in IT. Plan to meet again in one week. Franne Forts, LCSW

## 2016-08-06 ENCOUNTER — Ambulatory Visit: Payer: Medicaid Other

## 2016-08-13 ENCOUNTER — Other Ambulatory Visit: Payer: Self-pay | Admitting: Internal Medicine

## 2016-08-13 DIAGNOSIS — I10 Essential (primary) hypertension: Secondary | ICD-10-CM

## 2016-08-13 DIAGNOSIS — B20 Human immunodeficiency virus [HIV] disease: Secondary | ICD-10-CM

## 2016-09-09 ENCOUNTER — Ambulatory Visit: Payer: Medicaid Other | Admitting: Internal Medicine

## 2016-09-27 ENCOUNTER — Inpatient Hospital Stay (HOSPITAL_COMMUNITY)
Admission: EM | Admit: 2016-09-27 | Discharge: 2016-09-28 | DRG: 872 | Discharge status: Against Medical Advice | Payer: Medicaid Other | Attending: Internal Medicine | Admitting: Internal Medicine

## 2016-09-27 ENCOUNTER — Encounter (HOSPITAL_COMMUNITY): Payer: Self-pay | Admitting: Emergency Medicine

## 2016-09-27 ENCOUNTER — Emergency Department (HOSPITAL_COMMUNITY): Payer: Medicaid Other

## 2016-09-27 DIAGNOSIS — J069 Acute upper respiratory infection, unspecified: Secondary | ICD-10-CM | POA: Diagnosis not present

## 2016-09-27 DIAGNOSIS — F1110 Opioid abuse, uncomplicated: Secondary | ICD-10-CM | POA: Diagnosis not present

## 2016-09-27 DIAGNOSIS — B182 Chronic viral hepatitis C: Secondary | ICD-10-CM | POA: Diagnosis not present

## 2016-09-27 DIAGNOSIS — Z814 Family history of other substance abuse and dependence: Secondary | ICD-10-CM

## 2016-09-27 DIAGNOSIS — I10 Essential (primary) hypertension: Secondary | ICD-10-CM | POA: Diagnosis present

## 2016-09-27 DIAGNOSIS — Z811 Family history of alcohol abuse and dependence: Secondary | ICD-10-CM

## 2016-09-27 DIAGNOSIS — F1990 Other psychoactive substance use, unspecified, uncomplicated: Secondary | ICD-10-CM | POA: Diagnosis present

## 2016-09-27 DIAGNOSIS — B20 Human immunodeficiency virus [HIV] disease: Secondary | ICD-10-CM | POA: Diagnosis not present

## 2016-09-27 DIAGNOSIS — F339 Major depressive disorder, recurrent, unspecified: Secondary | ICD-10-CM | POA: Diagnosis not present

## 2016-09-27 DIAGNOSIS — A419 Sepsis, unspecified organism: Principal | ICD-10-CM | POA: Diagnosis present

## 2016-09-27 DIAGNOSIS — Z79899 Other long term (current) drug therapy: Secondary | ICD-10-CM

## 2016-09-27 DIAGNOSIS — L03114 Cellulitis of left upper limb: Secondary | ICD-10-CM | POA: Diagnosis not present

## 2016-09-27 DIAGNOSIS — F419 Anxiety disorder, unspecified: Secondary | ICD-10-CM | POA: Diagnosis present

## 2016-09-27 DIAGNOSIS — Z21 Asymptomatic human immunodeficiency virus [HIV] infection status: Secondary | ICD-10-CM | POA: Diagnosis present

## 2016-09-27 DIAGNOSIS — E871 Hypo-osmolality and hyponatremia: Secondary | ICD-10-CM | POA: Diagnosis not present

## 2016-09-27 DIAGNOSIS — Z8614 Personal history of Methicillin resistant Staphylococcus aureus infection: Secondary | ICD-10-CM

## 2016-09-27 DIAGNOSIS — L03119 Cellulitis of unspecified part of limb: Secondary | ICD-10-CM

## 2016-09-27 DIAGNOSIS — F17210 Nicotine dependence, cigarettes, uncomplicated: Secondary | ICD-10-CM | POA: Diagnosis present

## 2016-09-27 LAB — URINALYSIS, ROUTINE W REFLEX MICROSCOPIC
Glucose, UA: NEGATIVE mg/dL
Ketones, ur: NEGATIVE mg/dL
LEUKOCYTES UA: NEGATIVE
Nitrite: NEGATIVE
PH: 5 (ref 5.0–8.0)
Protein, ur: >=300 mg/dL — AB
SPECIFIC GRAVITY, URINE: 1.026 (ref 1.005–1.030)

## 2016-09-27 LAB — CBC WITH DIFFERENTIAL/PLATELET
BASOS ABS: 0 10*3/uL (ref 0.0–0.1)
Basophils Relative: 0 %
EOS PCT: 1 %
Eosinophils Absolute: 0.1 10*3/uL (ref 0.0–0.7)
HCT: 35.7 % — ABNORMAL LOW (ref 36.0–46.0)
Hemoglobin: 12.1 g/dL (ref 12.0–15.0)
LYMPHS PCT: 34 %
Lymphs Abs: 4.1 10*3/uL — ABNORMAL HIGH (ref 0.7–4.0)
MCH: 30.5 pg (ref 26.0–34.0)
MCHC: 33.9 g/dL (ref 30.0–36.0)
MCV: 89.9 fL (ref 78.0–100.0)
Monocytes Absolute: 1.3 10*3/uL — ABNORMAL HIGH (ref 0.1–1.0)
Monocytes Relative: 11 %
NEUTROS ABS: 6.4 10*3/uL (ref 1.7–7.7)
Neutrophils Relative %: 54 %
PLATELETS: 300 10*3/uL (ref 150–400)
RBC: 3.97 MIL/uL (ref 3.87–5.11)
RDW: 12.1 % (ref 11.5–15.5)
WBC: 11.9 10*3/uL — AB (ref 4.0–10.5)

## 2016-09-27 LAB — RESPIRATORY PANEL BY PCR
ADENOVIRUS-RVPPCR: NOT DETECTED
Bordetella pertussis: NOT DETECTED
CHLAMYDOPHILA PNEUMONIAE-RVPPCR: NOT DETECTED
CORONAVIRUS HKU1-RVPPCR: NOT DETECTED
CORONAVIRUS NL63-RVPPCR: NOT DETECTED
Coronavirus 229E: NOT DETECTED
Coronavirus OC43: NOT DETECTED
Influenza A: NOT DETECTED
Influenza B: NOT DETECTED
MYCOPLASMA PNEUMONIAE-RVPPCR: NOT DETECTED
Metapneumovirus: NOT DETECTED
PARAINFLUENZA VIRUS 3-RVPPCR: NOT DETECTED
Parainfluenza Virus 1: NOT DETECTED
Parainfluenza Virus 2: NOT DETECTED
Parainfluenza Virus 4: NOT DETECTED
RHINOVIRUS / ENTEROVIRUS - RVPPCR: DETECTED — AB
Respiratory Syncytial Virus: NOT DETECTED

## 2016-09-27 LAB — COMPREHENSIVE METABOLIC PANEL
ALT: 25 U/L (ref 14–54)
ANION GAP: 10 (ref 5–15)
AST: 38 U/L (ref 15–41)
Albumin: 3.3 g/dL — ABNORMAL LOW (ref 3.5–5.0)
Alkaline Phosphatase: 79 U/L (ref 38–126)
BUN: 5 mg/dL — ABNORMAL LOW (ref 6–20)
CHLORIDE: 99 mmol/L — AB (ref 101–111)
CO2: 23 mmol/L (ref 22–32)
CREATININE: 0.94 mg/dL (ref 0.44–1.00)
Calcium: 9.2 mg/dL (ref 8.9–10.3)
GFR calc Af Amer: >60 mL/min (ref >60)
Glucose, Bld: 164 mg/dL — ABNORMAL HIGH (ref 65–99)
POTASSIUM: 3.8 mmol/L (ref 3.5–5.1)
SODIUM: 132 mmol/L — AB (ref 135–145)
Total Bilirubin: 0.4 mg/dL (ref 0.3–1.2)
Total Protein: 7.9 g/dL (ref 6.5–8.1)

## 2016-09-27 LAB — I-STAT CG4 LACTIC ACID, ED: LACTIC ACID, VENOUS: 1.51 mmol/L (ref 0.5–1.9)

## 2016-09-27 LAB — MRSA PCR SCREENING: MRSA BY PCR: NEGATIVE

## 2016-09-27 LAB — I-STAT TROPONIN, ED: Troponin i, poc: 0 ng/mL (ref 0.00–0.08)

## 2016-09-27 MED ORDER — OXYCODONE HCL 5 MG PO TABS
5.0000 mg | ORAL_TABLET | ORAL | Status: DC | PRN
  Administered 2016-09-27 – 2016-09-28 (×6): 5 mg via ORAL
  Filled 2016-09-27 (×6): qty 1

## 2016-09-27 MED ORDER — VANCOMYCIN HCL IN DEXTROSE 750-5 MG/150ML-% IV SOLN
750.0000 mg | Freq: Two times a day (BID) | INTRAVENOUS | Status: DC
  Administered 2016-09-27 – 2016-09-28 (×3): 750 mg via INTRAVENOUS
  Filled 2016-09-27 (×3): qty 150

## 2016-09-27 MED ORDER — SODIUM CHLORIDE 0.9 % IV BOLUS (SEPSIS)
1000.0000 mL | Freq: Once | INTRAVENOUS | Status: AC
  Administered 2016-09-27: 1000 mL via INTRAVENOUS

## 2016-09-27 MED ORDER — VANCOMYCIN HCL IN DEXTROSE 1-5 GM/200ML-% IV SOLN
1000.0000 mg | Freq: Once | INTRAVENOUS | Status: AC
  Administered 2016-09-27: 1000 mg via INTRAVENOUS
  Filled 2016-09-27: qty 200

## 2016-09-27 MED ORDER — ENOXAPARIN SODIUM 40 MG/0.4ML ~~LOC~~ SOLN
40.0000 mg | SUBCUTANEOUS | Status: DC
  Administered 2016-09-27 – 2016-09-28 (×2): 40 mg via SUBCUTANEOUS
  Filled 2016-09-27 (×2): qty 0.4

## 2016-09-27 MED ORDER — ACETAMINOPHEN 500 MG PO TABS
1000.0000 mg | ORAL_TABLET | Freq: Once | ORAL | Status: AC
  Administered 2016-09-27: 1000 mg via ORAL
  Filled 2016-09-27: qty 2

## 2016-09-27 MED ORDER — ACETAMINOPHEN 650 MG RE SUPP
650.0000 mg | Freq: Four times a day (QID) | RECTAL | Status: DC | PRN
Start: 2016-09-27 — End: 2016-09-28

## 2016-09-27 MED ORDER — SODIUM CHLORIDE 0.9 % IV SOLN
1000.0000 mL | INTRAVENOUS | Status: DC
  Administered 2016-09-27 – 2016-09-28 (×3): 1000 mL via INTRAVENOUS

## 2016-09-27 MED ORDER — PIPERACILLIN-TAZOBACTAM 3.375 G IVPB 30 MIN
3.3750 g | Freq: Once | INTRAVENOUS | Status: AC
  Administered 2016-09-27: 3.375 g via INTRAVENOUS
  Filled 2016-09-27: qty 50

## 2016-09-27 MED ORDER — TRAZODONE HCL 100 MG PO TABS
100.0000 mg | ORAL_TABLET | Freq: Every evening | ORAL | Status: DC | PRN
  Administered 2016-09-27: 100 mg via ORAL
  Filled 2016-09-27: qty 1

## 2016-09-27 MED ORDER — DIAZEPAM 5 MG PO TABS: 5.0000 mg | ORAL_TABLET | Freq: Every evening | ORAL | Status: DC | PRN

## 2016-09-27 MED ORDER — ACETAMINOPHEN 325 MG PO TABS
650.0000 mg | ORAL_TABLET | Freq: Four times a day (QID) | ORAL | Status: DC | PRN
  Administered 2016-09-27: 650 mg via ORAL
  Filled 2016-09-27: qty 2

## 2016-09-27 MED ORDER — DARUNAVIR-COBICISTAT 800-150 MG PO TABS
1.0000 | ORAL_TABLET | Freq: Every day | ORAL | Status: DC
  Administered 2016-09-27 – 2016-09-28 (×2): 1 via ORAL
  Filled 2016-09-27 (×2): qty 1

## 2016-09-27 MED ORDER — ABACAVIR-DOLUTEGRAVIR-LAMIVUD 600-50-300 MG PO TABS
1.0000 | ORAL_TABLET | Freq: Every day | ORAL | Status: DC
Start: 2016-09-27 — End: 2016-09-28
  Administered 2016-09-27 – 2016-09-28 (×2): 1 via ORAL
  Filled 2016-09-27 (×2): qty 1

## 2016-09-27 NOTE — Progress Notes (Signed)
Patient arrived to unit 5 west bed 29 from ED.Assisted patient in bed by nursing staff. Oriented patient to nursing unit and call bell. Discussed fall prevention patient safety plan and handout given to patient verbalized understanding.No acute distress noted at present time.

## 2016-09-27 NOTE — H&P (Signed)
History and Physical  Patient Name: Courtney Ellis     WUJ:[id number]    DOB: 11-Jun-1960    DOA: 09/27/2016 PCP: Patient, No Pcp Per  Patient coming from: Home  Chief Complaint: Hand swelling      HPI: Courtney Ellis is a 56 y.o. female with a past medical history significant for HIV on treatment, Hep C, depression and IVDU relapse who presents with hand pain for 2 days.  The patient was in her usual state of health until this week she developed URI symptoms of cough, yellow sputum with nasal congestion, runny nose, sore throat and headaches.  Then about two days ago, she started to develop pain and swelling in her left hand near the site of one of hte places she had injected drugs this week.  In the last two days, this hand has become more swollen, painful, and the swelling started to extend up the wrist so she came to the ER.  ED course: -Temp 102.28F, heart rate 104, respirations and pulse is normal, blood pressure 124/88 -Na 132, K 3.8, Cr 0.94, WBC 11.9K, Hgb 12.1 -Lactic acid 1.5 -Troponin negative -Chest x-ray without focal opacity -X-ray of the hand showed swelling of soft tissues, otherwise unremarkable -Blood cultures were obtained, 1 L normal saline was administered -She was given vancomycin and Zosyn for sepsis and TRH was asked to evaluate     ROS: Review of Systems  Constitutional: Positive for fever.  HENT: Positive for congestion and sore throat.   Eyes: Positive for blurred vision.  Respiratory: Positive for cough and sputum production.   Neurological: Positive for headaches.  Psychiatric/Behavioral: Positive for substance abuse.  All other systems reviewed and are negative.         Past Medical History:  Diagnosis Date  . Anxiety   . Depression   . Hepatitis    C  . Heroin abuse   . HIV infection (HCC)   . Hypertension     Past Surgical History:  Procedure Laterality Date  . CESAREAN SECTION    . DILATION AND CURETTAGE OF UTERUS N/A 06/27/2016   Procedure: Removal Erasmo Leventhal Body Vagina;  Surgeon: Myna Hidalgo, DO;  Location: [facility];  Service: Gynecology;  Laterality: N/A;    Social History: Patient lives alone.  The patient walks unassisted.  She does not work right now.  Smoker.  No Known Allergies  Family history: family history includes Alcohol abuse in her father, mother, and paternal uncle; Cancer in her father; Depression in her brother, father, maternal aunt, maternal uncle, mother, paternal aunt, paternal uncle, and sister; Diabetes in her mother, paternal aunt, paternal uncle, and sister; Drug abuse in her brother, father, maternal aunt, maternal uncle, mother, paternal aunt, paternal uncle, and sister; Hypertension in her mother; Kidney disease in her paternal uncle; Mental illness in her brother and sister; Stroke in her paternal aunt.  Prior to Admission medications   Medication Sig Start Date End Date Taking? Authorizing Provider  amLODipine (NORVASC) 5 MG tablet TAKE 1 TABLET (5 MG TOTAL) BY MOUTH DAILY. 08/13/16  Yes Comer, Belia Heman, MD  diazepam (VALIUM) 5 MG tablet Take 1 tablet (5 mg total) by mouth at bedtime as needed for anxiety. 07/21/16  Yes Comer, Belia Heman, MD  PREZCOBIX 800-150 MG tablet TAKE 1 TABLET BY MOUTH DAILY. SWALLOW WHOLE. DO NOT CRUSH, BREAK OR CHEW TABLETS. TAKE WITH FOOD. 02/18/16  Yes Comer, Belia Heman, MD  traZODone (DESYREL) 100 MG tablet Take 1 tablet (100 mg  total) by mouth at bedtime. 06/12/16  Yes Comer, Belia Heman, MD  TRIUMEQ 600-50-300 MG tablet TAKE 1 TABLET BY MOUTH DAILY. 08/13/16  Yes Comer, Belia Heman, MD  cetirizine (ZYRTEC) 10 MG tablet Take 1 tablet (10 mg total) by mouth daily. 07/16/16   Gardiner Barefoot, MD  PROVENTIL HFA [phone] Base) MCG/ACT inhaler INHALE TWO PUFFS INTO LUNGS EVERY SIX HOURS AS NEEDED FOR WHEEZING OR SHORTNESS OF BREATH 05/06/16   Gardiner Barefoot, MD       Physical Exam: BP 112/71   Pulse 73   Temp 98.9 F (37.2 C) (Oral)   Resp 15   Ht 5\' 3"  (1.6 m)   Wt 63.5 kg  (140 lb)   LMP 09/08/2011   SpO2 94%   BMI 24.80 kg/m  General appearance: Well-developed, thin adult female, alert and in no acute distress, appears tired.   Eyes: Anicteric, conjunctiva pink, lids and lashes normal. PERRL.    ENT: No nasal deformity, discharge, epistaxis.  Turbinates swollen.  Hearing normal. OP moist without lesions.  No posterior exudates. Bilateral TMs translucent. Neck: No neck masses.  Trachea midline.  No thyromegaly/tenderness. Lymph: No cervical or supraclavicular lymphadenopathy.  No axillary lymphadenopathy. Skin: Warm and dry.   No suspicious rashes or lesions. Cardiac: Tachycardic, regular, nl S1-S2, no murmurs appreciated.  Capillary refill is brisk.  JVP normal.  No LE edema.  Radial and DP pulses 2+ and symmetric. Respiratory: Normal respiratory rate and rhythm.  CTAB without rales or wheezes.  Maybe diminished at right more than left abse. Abdomen: Abdomen soft.  No TTP. No ascites, distension, hepatosplenomegaly.   MSK: The right hand is normal, the left is markedly swollen, above the wrist.  There is mild pain to palpation around it.  There eis no obvious fluctuance.  There is minimal pain to movement of fingers, no loss of sensation.  No cyanosis or clubbing.   Neuro: Cranial nerves normal.  Sensation intact to light touch. Speech is fluent.  Muscle strength normal.    Psych: Sensorium intact and responding to questions, attention normal.  Behavior appropriate.  Affect normal.  Judgment and insight appear normal.     Labs on Admission:  I have personally reviewed following labs and imaging studies: CBC:  Recent Labs Lab 09/27/16 0135  WBC 11.9*  NEUTROABS 6.4  HGB 12.1  HCT 35.7*  MCV 89.9  PLT 300   Basic Metabolic Panel:  Recent Labs Lab 09/27/16 0135  NA 132*  K 3.8  CL 99*  CO2 23  GLUCOSE 164*  BUN 5*  CREATININE 0.94  CALCIUM 9.2   GFR: Estimated Creatinine Clearance: 61.3 mL/min (by C-G formula based on SCr of 0.94  mg/dL).  Liver Function Tests:  Recent Labs Lab 09/27/16 0135  AST 38  ALT 25  ALKPHOS 79  BILITOT 0.4  PROT 7.9  ALBUMIN 3.3*   No results for input(s): LIPASE, AMYLASE in the last 168 hours. No results for input(s): AMMONIA in the last 168 hours. Coagulation Profile: No results for input(s): INR, PROTIME in the last 168 hours. Cardiac Enzymes: No results for input(s): CKTOTAL, CKMB, CKMBINDEX, TROPONINI in the last 168 hours. BNP (last 3 results) No results for input(s): PROBNP in the last 8760 hours. HbA1C: No results for input(s): HGBA1C in the last 72 hours. CBG: No results for input(s): GLUCAP in the last 168 hours. Lipid Profile: No results for input(s): CHOL, HDL, LDLCALC, TRIG, CHOLHDL, LDLDIRECT in the last 72 hours. Thyroid Function  Tests: No results for input(s): TSH, T4TOTAL, FREET4, T3FREE, THYROIDAB in the last 72 hours. Anemia Panel: No results for input(s): VITAMINB12, FOLATE, FERRITIN, TIBC, IRON, RETICCTPCT in the last 72 hours. Sepsis Labs: Lactic acid 1.5 Invalid input(s): PROCALCITONIN, LACTICIDVEN No results found for this or any previous visit (from the past 240 hour(s)).       Radiological Exams on Admission: Personally reviewed CXR is clear without airspace disease; Hand radiograph report reviewed: Dg Chest 2 View  Result Date: 09/27/2016 CLINICAL DATA:  Cough and congestion. EXAM: CHEST  2 VIEW COMPARISON:  10/03/2010 FINDINGS: The cardiomediastinal contours are normal. Chronic borderline hyperinflation and bronchial thickening. Pulmonary vasculature is normal. No consolidation, pleural effusion, or pneumothorax. No acute osseous abnormalities are seen. IMPRESSION: Chronic mild bronchial thickening and borderline hyperinflation. No acute abnormality. Electronically Signed   By: Rubye OaksMelanie  Ehinger M.D.   On: 09/27/2016 02:20   Dg Hand Complete Left  Result Date: 09/27/2016 CLINICAL DATA:  Left hand pain and swelling for 3 days. IV drug use. EXAM:  LEFT HAND - COMPLETE 3+ VIEW COMPARISON:  None. FINDINGS: There is no evidence of fracture or dislocation. There is no evidence of arthropathy or other focal bone abnormality. Diffuse soft tissue edema of the hand. No radiopaque foreign body no tracking soft tissue air. IMPRESSION: Diffuse soft tissue edema. No osseous abnormality or radiopaque foreign body. Electronically Signed   By: Rubye OaksMelanie  Ehinger M.D.   On: 09/27/2016 03:46    EKG: Independently reviewed. Rate 83, QTc 407, sinus.    Assessment/Plan  1. Cellulitis of left hand:  No obvious abscess, no pain with movement.  Personal Hx MRSA.  Given this and hand involvement, will provide vancomycin for now. -IV vancomycin -Will consult Orthopedics if worsening pain, concern for developing abscess -Follow cultures   2. Upper respiratory infection:  -RV panel  3. HIV:  Last CD4 count 350, viral load undetectable in February. Endorses daily compliance. -Consult infectious disease -Continue home medicines  4. IV drug use:  Appears she has relapsed. -Consult the social worker  5. Hyponatremia:  Mild -Fluids and trend BMP  6. Essential hypertension:  At goal -Hold amlodipine for now until hemodynamics clearer        DVT prophylaxis: Lovenox  Code Status: FULL  Family Communication: None present  Disposition Plan: Anticipate IV fluids, empiric vancomycin, consult to ID. Consults called: None overnight Admission status: OBS    Medical decision making: Patient seen at 4:50 AM on 09/27/2016.  The patient was discussed with Dr. Manus Gunningancour.  What exists of the patient's chart was reviewed in depth and summarized above.  Clinical condition: stable.        Alberteen SamChristopher P Tyan Lasure [facility] Pager [phone]

## 2016-09-27 NOTE — ED Notes (Signed)
Patient transported to X-ray 

## 2016-09-27 NOTE — ED Triage Notes (Signed)
Pt presents with multiple complaints. HA, cough, nasal congestion, sore throat, and swelling to L hand. Pt febrile in triage.

## 2016-09-27 NOTE — ED Provider Notes (Signed)
[facility]-EMERGENCY DEPT Provider Note   CSN: [id number] Arrival date & time: 09/27/16  0123   By signing my name below, I, Courtney Ellis, attest that this documentation has been prepared under the direction and in the presence of Toshika Parrow, Jeannett Senior, MD . Electronically Signed: Elsie Ellis, ED Scribe. 09/27/2016. 2:24 AM.  History   Chief Complaint Chief Complaint  Patient presents with  . Nasal Congestion  . Abscess    HPI Courtney Ellis is a 56 y.o. female with a history of HIV, Hepatitis C, HTN, who presents to the Emergency Department complaining of possible abscess to left hand onset 2 days ago. Pt reports associated left hand pain x 2 days, left hand swelling x 2 days, intermittent blurred vision, weakness, productive cough x yellow sputum, post-tussive emesis, subjective fever, resolved sore throat, nasal congestion, HA, rhinorrhea, and CP only with cough. She hasn't tried any medications for her symptoms. Pt notes that she uses heroin and last injected to her left hand 5 days ago with a clean needle. Pt reports that she goes to her Infectious Disease Provider every 3 months and per pt chart review, her last CD4 count was in Feburary 2018 and was 350. She denies diarrhea, abdominal pain, and any other symptoms.     The history is provided by the patient. No language interpreter was used.    Past Medical History:  Diagnosis Date  . Anxiety   . Depression   . Hepatitis    C  . Heroin abuse   . HIV infection (HCC)   . Hypertension     Patient Active Problem List   Diagnosis Date Noted  . Opioid abuse 07/29/2016  . Substance abuse 06/19/2015  . Routine screening for STI (sexually transmitted infection) 02/06/2014  . Encounter for long-term (current) use of medications 02/06/2014  . HTN (hypertension) 08/03/2007  . Insomnia 02/17/2007  . Chronic hepatitis C without hepatic coma (HCC) 11/24/2006  . DEPRESSIVE DISORDER, MAJOR RCR, UNSPECIFIED 07/28/2006  . Human  immunodeficiency virus (HIV) disease (HCC) 05/15/2006    Past Surgical History:  Procedure Laterality Date  . CESAREAN SECTION    . DILATION AND CURETTAGE OF UTERUS N/A 06/27/2016   Procedure: Removal Erasmo Leventhal Body Vagina;  Surgeon: Myna Hidalgo, DO;  Location: [facility];  Service: Gynecology;  Laterality: N/A;    OB History    Gravida Para Term Preterm AB Living   3 3 3     3    SAB TAB Ectopic Multiple Live Births                   Home Medications    Prior to Admission medications   Medication Sig Start Date End Date Taking? Authorizing Provider  amLODipine (NORVASC) 5 MG tablet TAKE 1 TABLET (5 MG TOTAL) BY MOUTH DAILY. 08/13/16   Gardiner Barefoot, MD  cetirizine (ZYRTEC) 10 MG tablet Take 1 tablet (10 mg total) by mouth daily. 07/16/16   Comer, Belia Heman, MD  diazepam (VALIUM) 5 MG tablet Take 1 tablet (5 mg total) by mouth at bedtime as needed for anxiety. 07/21/16   Comer, Belia Heman, MD  PREZCOBIX 800-150 MG tablet TAKE 1 TABLET BY MOUTH DAILY. SWALLOW WHOLE. DO NOT CRUSH, BREAK OR CHEW TABLETS. TAKE WITH FOOD. 02/18/16   Gardiner Barefoot, MD  PROVENTIL HFA [phone] Base) MCG/ACT inhaler INHALE TWO PUFFS INTO LUNGS EVERY SIX HOURS AS NEEDED FOR WHEEZING OR SHORTNESS OF BREATH 05/06/16   Comer, Belia Heman, MD  traZODone (  DESYREL) 100 MG tablet Take 1 tablet (100 mg total) by mouth at bedtime. 06/12/16   Comer, Belia Heman, MD  TRIUMEQ 600-50-300 MG tablet TAKE 1 TABLET BY MOUTH DAILY. 08/13/16   Gardiner Barefoot, MD    Family History Family History  Problem Relation Age of Onset  . Hypertension Mother   . Alcohol abuse Mother   . Depression Mother   . Diabetes Mother   . Drug abuse Mother   . Alcohol abuse Father   . Cancer Father   . Depression Father   . Drug abuse Father   . Depression Sister   . Diabetes Sister   . Drug abuse Sister   . Mental illness Sister   . Depression Brother   . Drug abuse Brother   . Mental illness Brother   . Depression Maternal Aunt   . Drug abuse  Maternal Aunt   . Depression Maternal Uncle   . Drug abuse Maternal Uncle   . Depression Paternal Aunt   . Diabetes Paternal Aunt   . Drug abuse Paternal Aunt   . Stroke Paternal Aunt   . Alcohol abuse Paternal Uncle   . Depression Paternal Uncle   . Diabetes Paternal Uncle   . Drug abuse Paternal Uncle   . Kidney disease Paternal Uncle     Social History Social History  Substance Use Topics  . Smoking status: Current Every Day Smoker    Packs/day: 0.20    Types: Cigarettes  . Smokeless tobacco: Never Used     Comment: 3 cigarettes a day  . Alcohol use No     Allergies   Patient has no known allergies.   Review of Systems Review of Systems All systems reviewed and are negative for acute change except as noted in the HPI.    Physical Exam Updated Vital Signs BP 125/80 (BP Location: Left Arm)   Pulse (!) 104   Temp (!) 102.2 F (39 C) (Oral)   Resp 18   Ht 5\' 3"  (1.6 m)   Wt 140 lb (63.5 kg)   LMP 09/08/2011   SpO2 94%   BMI 24.80 kg/m   Physical Exam  Constitutional: She is oriented to person, place, and time. She appears well-developed and well-nourished. She appears ill. No distress.  Chronically ill appearing.   HENT:  Head: Normocephalic and atraumatic.  Mouth/Throat: Oropharynx is clear and moist. No oropharyngeal exudate.  Eyes: Conjunctivae and EOM are normal. Pupils are equal, round, and reactive to light.  Neck: Normal range of motion. Neck supple.  No meningismus.  Cardiovascular: Normal rate, regular rhythm, normal heart sounds and intact distal pulses.   No murmur heard. Pulmonary/Chest: Effort normal. No respiratory distress. She has rhonchi.  Coarse rhonchi bilaterally.   Abdominal: Soft. There is no tenderness. There is no rebound and no guarding.  Musculoskeletal: She exhibits edema and tenderness.       Left hand: She exhibits decreased range of motion and swelling.  Diffuse swelling over dorsum of left hand with warmth noted. No  appreciable erythema or flutuance. Reduced ROM of fingers due to pain and swelling.   Neurological: She is alert and oriented to person, place, and time. No cranial nerve deficit. She exhibits normal muscle tone. Coordination normal.   5/5 strength throughout. CN 2-12 intact. Equal grip strength.   Skin: Skin is warm.  Psychiatric: She has a normal mood and affect. Her behavior is normal.  Nursing note and vitals reviewed.    ED Treatments /  Results  DIAGNOSTIC STUDIES:  Oxygen Saturation is 90% on RA, low by my interpretation.    COORDINATION OF CARE:  2:41 AM Discussed treatment plan with pt at bedside and pt agreed to plan.  Labs (all labs ordered are listed, but only abnormal results are displayed) Labs Reviewed  COMPREHENSIVE METABOLIC PANEL - Abnormal; Notable for the following:       Result Value   Sodium 132 (*)    Chloride 99 (*)    Glucose, Bld 164 (*)    BUN 5 (*)    Albumin 3.3 (*)    All other components within normal limits  CBC WITH DIFFERENTIAL/PLATELET - Abnormal; Notable for the following:    WBC 11.9 (*)    HCT 35.7 (*)    Lymphs Abs 4.1 (*)    Monocytes Absolute 1.3 (*)    All other components within normal limits  URINALYSIS, ROUTINE W REFLEX MICROSCOPIC - Abnormal; Notable for the following:    Color, Urine AMBER (*)    APPearance HAZY (*)    Hgb urine dipstick SMALL (*)    Bilirubin Urine SMALL (*)    Protein, ur >=300 (*)    Bacteria, UA RARE (*)    Squamous Epithelial / LPF 6-30 (*)    All other components within normal limits  CULTURE, BLOOD (ROUTINE X 2)  CULTURE, BLOOD (ROUTINE X 2)  URINE CULTURE  RESPIRATORY PANEL BY PCR  MRSA PCR SCREENING  I-STAT CG4 LACTIC ACID, ED  I-STAT TROPOININ, ED   EKG  EKG Interpretation  Date/Time:  Saturday September 27 2016 02:53:56 EDT Ventricular Rate:  83 PR Interval:    QRS Duration: 78 QT Interval:  346 QTC Calculation: 407 R Axis:   72 Text Interpretation:  Sinus rhythm Probable anteroseptal  infarct, old Artifact Confirmed by Glynn Octaveancour, Dasja Brase [phone]([id number]) on 09/27/2016 2:59:17 AM       Radiology Dg Chest 2 View  Result Date: 09/27/2016 CLINICAL DATA:  Cough and congestion. EXAM: CHEST  2 VIEW COMPARISON:  10/03/2010 FINDINGS: The cardiomediastinal contours are normal. Chronic borderline hyperinflation and bronchial thickening. Pulmonary vasculature is normal. No consolidation, pleural effusion, or pneumothorax. No acute osseous abnormalities are seen. IMPRESSION: Chronic mild bronchial thickening and borderline hyperinflation. No acute abnormality. Electronically Signed   By: Rubye OaksMelanie  Ehinger M.D.   On: 09/27/2016 02:20    Procedures Procedures (including critical care time)  Medications Ordered in ED Medications  0.9 %  sodium chloride infusion (1,000 mLs Intravenous New Bag/Given 09/27/16 0253)  piperacillin-tazobactam (ZOSYN) IVPB 3.375 g (3.375 g Intravenous New Bag/Given 09/27/16 0250)  vancomycin (VANCOCIN) IVPB 1000 mg/200 mL premix (1,000 mg Intravenous New Bag/Given 09/27/16 0253)  sodium chloride 0.9 % bolus 1,000 mL (1,000 mLs Intravenous New Bag/Given 09/27/16 0250)  acetaminophen (TYLENOL) tablet 1,000 mg (1,000 mg Oral Given 09/27/16 0217)     Initial Impression / Assessment and Plan / ED Course  I have reviewed the triage vital signs and the nursing notes.  Pertinent labs & imaging results that were available during my care of the patient were reviewed by me and considered in my medical decision making (see chart for details).    Patient with history of HIV, hepatitis C, heroin abuse presenting with cough, congestion, swelling of her left hand after injecting heroin as well as body aches and nasal drainage for the past several days. She is tachycardic and febrile on arrival.  Septic workup begun. Patient's hand does not have discrete fluid collection that appears to be drainable.  Immunocompromised patient with cellulitis of hand with no drainable abscess at this time. She  also appears to have a respiratory infection likely viral.  Patient given broad-spectrum antibiotics after blood cultures obtained. Lactate is normal. CXR normal.   Admission for IV antibiotics d/w Dr. Maryfrances Bunnell.   EMERGENCY DEPARTMENT US SOFT TISSUE INTERPRETATION "Study: Limited Soft Tissue Ultrasound"  INDICATIONS: Soft tissue infection Multiple views of the body part were obtained in real-time with a multi-frequency linear probe  PERFORMED BY: Myself IMAGES ARCHIVED?: Yes SIDE:Left BODY PART:Upper extremity INTERPRETATION:  No abcess noted and Cellulitis present     Final Clinical Impressions(s) / ED Diagnoses   Final diagnoses:  Cellulitis of left hand    New Prescriptions New Prescriptions   No medications on file  I personally performed the services described in this documentation, which was scribed in my presence. The recorded information has been reviewed and is accurate.     Glynn Octave, MD 09/27/16 [phone]

## 2016-09-27 NOTE — Progress Notes (Signed)
PROGRESS NOTE  Courtney Ellis  ONG:[id number] DOB: 30-Aug-1960 DOA: 09/27/2016 PCP: Patient, No Pcp Per Outpatient Specialists:  Subjective: Fever is better  Brief Narrative:  Courtney Ellis is a 56 y.o. female with a past medical history significant for HIV on treatment, Hep C, depression and IVDU relapse who presents with hand pain for 2 days.  The patient was in her usual state of health until this week she developed URI symptoms of cough, yellow sputum with nasal congestion, runny nose, sore throat and headaches.  Then about two days ago, she started to develop pain and swelling in her left hand near the site of one of hte places she had injected drugs this week.  In the last two days, this hand has become more swollen, painful, and the swelling started to extend up the wrist so she came to the ER.  Assessment & Plan:   Principal Problem:   Cellulitis of left hand Active Problems:   Human immunodeficiency virus (HIV) disease (HCC)   Chronic hepatitis C without hepatic coma (HCC)   Major depressive disorder, recurrent episode (HCC)   Essential hypertension   Opioid abuse   URI (upper respiratory infection)   Hyponatremia  This is a no charge note, patient seen earlier today by my colleague Dr. Maryfrances Bunnell. Patient seen and examined, and data base reviewed, cellulitis of the left hand and sepsis.  Cellulitis of left hand:  -Patient has a history of MRSA cellulitis, she was trying to inject herself with her when in the left snuffbox. -Developed fever, chills and sepsis. -Started on IV vancomycin and Zosyn. -Bedside ultrasound done in the emergency department and per Dr. Manus Gunning patient does not have abscess. -Continue current treatment.  Upper respiratory infection:  -RV panel -She is already on vancomycin and Zosyn for her cellulitis.  HIV:  Last CD4 count 350, viral load undetectable in February. Endorses daily compliance. -Consult infectious disease -Continue home  medicines  IV drug use:  Appears she has relapsed. Counseled extensively. Consult Child psychotherapist for substance abuse.  Hyponatremia:  Mild -Fluids and trend BMP  Essential hypertension:  At goal -Hold amlodipine for now until hemodynamics clearer   DVT prophylaxis:  Code Status: Full Code Family Communication:  Disposition Plan:  Diet: Diet regular Room service appropriate? Yes; Fluid consistency: Thin  Consultants:   None  Procedures:   None  Antimicrobials:   None   Objective: Vitals:   09/27/16 0503 09/27/16 0509 09/27/16 0515 09/27/16 0601  BP:   112/71 119/70  Pulse:   73 71  Resp:   15 18  Temp:    98.2 F (36.8 C)  TempSrc:    Oral  SpO2: 94% 94%  93%  Weight:    65 kg (143 lb 3.2 oz)  Height:    5\' 3"  (1.6 m)    Intake/Output Summary (Last 24 hours) at 09/27/16 1151 Last data filed at 09/27/16 0900  Gross per 24 hour  Intake          1999.58 ml  Output                0 ml  Net          1999.58 ml   Filed Weights   09/27/16 0133 09/27/16 0601  Weight: 63.5 kg (140 lb) 65 kg (143 lb 3.2 oz)    Examination: General exam: Appears calm and comfortable  Respiratory system: Clear to auscultation. Respiratory effort normal. Cardiovascular system: S1 & S2 heard, RRR. No  JVD, murmurs, rubs, gallops or clicks. No pedal edema. Gastrointestinal system: Abdomen is nondistended, soft and nontender. No organomegaly or masses felt. Normal bowel sounds heard. Central nervous system: Alert and oriented. No focal neurological deficits. Extremities: Symmetric 5 x 5 power. Skin: No rashes, lesions or ulcers Psychiatry: Judgement and insight appear normal. Mood & affect appropriate.   Data Reviewed: I have personally reviewed following labs and imaging studies  CBC:  Recent Labs Lab 09/27/16 0135  WBC 11.9*  NEUTROABS 6.4  HGB 12.1  HCT 35.7*  MCV 89.9  PLT 300   Basic Metabolic Panel:  Recent Labs Lab 09/27/16 0135  NA 132*  K 3.8  CL 99*    CO2 23  GLUCOSE 164*  BUN 5*  CREATININE 0.94  CALCIUM 9.2   GFR: Estimated Creatinine Clearance: 62 mL/min (by C-G formula based on SCr of 0.94 mg/dL). Liver Function Tests:  Recent Labs Lab 09/27/16 0135  AST 38  ALT 25  ALKPHOS 79  BILITOT 0.4  PROT 7.9  ALBUMIN 3.3*   No results for input(s): LIPASE, AMYLASE in the last 168 hours. No results for input(s): AMMONIA in the last 168 hours. Coagulation Profile: No results for input(s): INR, PROTIME in the last 168 hours. Cardiac Enzymes: No results for input(s): CKTOTAL, CKMB, CKMBINDEX, TROPONINI in the last 168 hours. BNP (last 3 results) No results for input(s): PROBNP in the last 8760 hours. HbA1C: No results for input(s): HGBA1C in the last 72 hours. CBG: No results for input(s): GLUCAP in the last 168 hours. Lipid Profile: No results for input(s): CHOL, HDL, LDLCALC, TRIG, CHOLHDL, LDLDIRECT in the last 72 hours. Thyroid Function Tests: No results for input(s): TSH, T4TOTAL, FREET4, T3FREE, THYROIDAB in the last 72 hours. Anemia Panel: No results for input(s): VITAMINB12, FOLATE, FERRITIN, TIBC, IRON, RETICCTPCT in the last 72 hours. Urine analysis:    Component Value Date/Time   COLORURINE AMBER (A) 09/27/2016 0140   APPEARANCEUR HAZY (A) 09/27/2016 0140   LABSPEC 1.026 09/27/2016 0140   PHURINE 5.0 09/27/2016 0140   GLUCOSEU NEGATIVE 09/27/2016 0140   GLUCOSEU NEG mg/dL 09/81/191401/01/2007 [id number]   HGBUR SMALL (A) 09/27/2016 0140   BILIRUBINUR SMALL (A) 09/27/2016 0140   KETONESUR NEGATIVE 09/27/2016 0140   PROTEINUR >=300 (A) 09/27/2016 0140   UROBILINOGEN 0.2 02/22/2014 1200   NITRITE NEGATIVE 09/27/2016 0140   LEUKOCYTESUR NEGATIVE 09/27/2016 0140   Sepsis Labs: @LABRCNTIP (procalcitonin:4,lacticidven:4)  ) Recent Results (from the past 240 hour(s))  MRSA PCR Screening     Status: None   Collection Time: 09/27/16  6:03 AM  Result Value Ref Range Status   MRSA by PCR NEGATIVE NEGATIVE Final    Comment:         The GeneXpert MRSA Assay (FDA approved for NASAL specimens only), is one component of a comprehensive MRSA colonization surveillance program. It is not intended to diagnose MRSA infection nor to guide or monitor treatment for MRSA infections.      Invalid input(s): PROCALCITONIN, LACTICACIDVEN   Radiology Studies: Dg Chest 2 View  Result Date: 09/27/2016 CLINICAL DATA:  Cough and congestion. EXAM: CHEST  2 VIEW COMPARISON:  10/03/2010 FINDINGS: The cardiomediastinal contours are normal. Chronic borderline hyperinflation and bronchial thickening. Pulmonary vasculature is normal. No consolidation, pleural effusion, or pneumothorax. No acute osseous abnormalities are seen. IMPRESSION: Chronic mild bronchial thickening and borderline hyperinflation. No acute abnormality. Electronically Signed   By: Rubye OaksMelanie  Ehinger M.D.   On: 09/27/2016 02:20   Dg Hand Complete Left  Result Date:  09/27/2016 CLINICAL DATA:  Left hand pain and swelling for 3 days. IV drug use. EXAM: LEFT HAND - COMPLETE 3+ VIEW COMPARISON:  None. FINDINGS: There is no evidence of fracture or dislocation. There is no evidence of arthropathy or other focal bone abnormality. Diffuse soft tissue edema of the hand. No radiopaque foreign body no tracking soft tissue air. IMPRESSION: Diffuse soft tissue edema. No osseous abnormality or radiopaque foreign body. Electronically Signed   By: Rubye Oaks M.D.   On: 09/27/2016 03:46        Scheduled Meds: . abacavir-dolutegravir-lamiVUDine  1 tablet Oral Daily  . darunavir-cobicistat  1 tablet Oral Q breakfast  . enoxaparin (LOVENOX) injection  40 mg Subcutaneous Q24H   Continuous Infusions: . sodium chloride 1,000 mL (09/27/16 0253)  . vancomycin 750 mg (09/27/16 0900)     LOS: 0 days    Time spent: 35 minutes    Hedi Barkan A, MD [facility] Pager [phone]  If 7PM-7AM, please contact night-coverage [email or web address] Password [email or web address] 09/27/2016, 11:51  AM

## 2016-09-27 NOTE — Progress Notes (Signed)
Pharmacy Antibiotic Note  Courtney Ellis is a 56 y.o. female admitted on 09/27/2016 with hand cellulitis/abscess.  Pharmacy has been consulted for Vancomycin dosing. Hx IVDA. WBC 11.9. Renal function good.   Plan: Vancomycin 750 mg IV q12h Trend WBC, temp, renal function  F/U infectious work-up Drug levels as indicated   Height: 5\' 3"  (160 cm) Weight: 140 lb (63.5 kg) IBW/kg (Calculated) : 52.4  Temp (24hrs), Avg:100.5 F (38.1 C), Min:98.9 F (37.2 C), Max:102.2 F (39 C)   Recent Labs Lab 09/27/16 0135 09/27/16 0147  WBC 11.9*  --   CREATININE 0.94  --   LATICACIDVEN  --  1.51    Estimated Creatinine Clearance: 61.3 mL/min (by C-G formula based on SCr of 0.94 mg/dL).    No Known Allergies   Abran DukeLedford, Akin Yi 09/27/2016 5:50 AM

## 2016-09-27 NOTE — ED Notes (Signed)
Pt has multiple complaints. C/o cough with yellow phlegm x "a few days", swelling to the left hand, and nasal drainage.

## 2016-09-27 NOTE — Plan of Care (Signed)
Problem: Safety: Goal: Ability to remain free from injury will improve Outcome: Progressing Patient verbalized understanding of calling for assistance when needed.  Patient correctly demonstrated correct use of call bell.  Patient progressing towards goal.

## 2016-09-28 DIAGNOSIS — F1110 Opioid abuse, uncomplicated: Secondary | ICD-10-CM | POA: Diagnosis present

## 2016-09-28 DIAGNOSIS — L03114 Cellulitis of left upper limb: Secondary | ICD-10-CM | POA: Diagnosis not present

## 2016-09-28 DIAGNOSIS — E871 Hypo-osmolality and hyponatremia: Secondary | ICD-10-CM | POA: Diagnosis present

## 2016-09-28 DIAGNOSIS — F1990 Other psychoactive substance use, unspecified, uncomplicated: Secondary | ICD-10-CM

## 2016-09-28 DIAGNOSIS — Z814 Family history of other substance abuse and dependence: Secondary | ICD-10-CM | POA: Diagnosis not present

## 2016-09-28 DIAGNOSIS — Z8614 Personal history of Methicillin resistant Staphylococcus aureus infection: Secondary | ICD-10-CM | POA: Diagnosis not present

## 2016-09-28 DIAGNOSIS — F419 Anxiety disorder, unspecified: Secondary | ICD-10-CM | POA: Diagnosis present

## 2016-09-28 DIAGNOSIS — Z21 Asymptomatic human immunodeficiency virus [HIV] infection status: Secondary | ICD-10-CM | POA: Diagnosis present

## 2016-09-28 DIAGNOSIS — F17210 Nicotine dependence, cigarettes, uncomplicated: Secondary | ICD-10-CM | POA: Diagnosis present

## 2016-09-28 DIAGNOSIS — F339 Major depressive disorder, recurrent, unspecified: Secondary | ICD-10-CM | POA: Diagnosis present

## 2016-09-28 DIAGNOSIS — J069 Acute upper respiratory infection, unspecified: Secondary | ICD-10-CM | POA: Diagnosis present

## 2016-09-28 DIAGNOSIS — A419 Sepsis, unspecified organism: Secondary | ICD-10-CM | POA: Diagnosis present

## 2016-09-28 DIAGNOSIS — B182 Chronic viral hepatitis C: Secondary | ICD-10-CM | POA: Diagnosis present

## 2016-09-28 DIAGNOSIS — M79642 Pain in left hand: Secondary | ICD-10-CM | POA: Diagnosis present

## 2016-09-28 DIAGNOSIS — Z79899 Other long term (current) drug therapy: Secondary | ICD-10-CM | POA: Diagnosis not present

## 2016-09-28 DIAGNOSIS — I10 Essential (primary) hypertension: Secondary | ICD-10-CM | POA: Diagnosis present

## 2016-09-28 DIAGNOSIS — L03119 Cellulitis of unspecified part of limb: Secondary | ICD-10-CM

## 2016-09-28 DIAGNOSIS — Z811 Family history of alcohol abuse and dependence: Secondary | ICD-10-CM | POA: Diagnosis not present

## 2016-09-28 LAB — URINE CULTURE: Culture: 50000 — AB

## 2016-09-28 LAB — CBC
HCT: 34.4 % — ABNORMAL LOW (ref 36.0–46.0)
Hemoglobin: 11.3 g/dL — ABNORMAL LOW (ref 12.0–15.0)
MCH: 29.3 pg (ref 26.0–34.0)
MCHC: 32.8 g/dL (ref 30.0–36.0)
MCV: 89.1 fL (ref 78.0–100.0)
Platelets: 288 10*3/uL (ref 150–400)
RBC: 3.86 MIL/uL — AB (ref 3.87–5.11)
RDW: 12.1 % (ref 11.5–15.5)
WBC: 8.8 10*3/uL (ref 4.0–10.5)

## 2016-09-28 LAB — BASIC METABOLIC PANEL
Anion gap: 7 (ref 5–15)
CALCIUM: 8.7 mg/dL — AB (ref 8.9–10.3)
CHLORIDE: 106 mmol/L (ref 101–111)
CO2: 25 mmol/L (ref 22–32)
CREATININE: 0.69 mg/dL (ref 0.44–1.00)
GFR calc non Af Amer: >60 mL/min (ref >60)
Glucose, Bld: 118 mg/dL — ABNORMAL HIGH (ref 65–99)
Potassium: 3.8 mmol/L (ref 3.5–5.1)
SODIUM: 138 mmol/L (ref 135–145)

## 2016-09-28 MED ORDER — GUAIFENESIN-DM 100-10 MG/5ML PO SYRP: 5.0000 mL | ORAL_SOLUTION | ORAL | Status: DC | PRN

## 2016-09-28 MED ORDER — SODIUM CHLORIDE 0.9 % IV SOLN: 1000.0000 mL | INTRAVENOUS | Status: DC

## 2016-09-28 MED ORDER — GUAIFENESIN ER 600 MG PO TB12: 600.0000 mg | ORAL_TABLET | Freq: Two times a day (BID) | ORAL | Status: DC

## 2016-09-28 NOTE — Progress Notes (Addendum)
PROGRESS NOTE  Courtney Ellis  OZH:[id number] DOB: 11/16/1960 DOA: 09/27/2016 PCP: Patient, No Pcp Per Outpatient Specialists:  Subjective: Had fever of 101.3 last night, pain in her thumb base is worse. Continue to have cough and sputum production.  Brief Narrative:  Courtney Spiceonya M Schuman is a 56 y.o. female with a past medical history significant for HIV on treatment, Hep C, depression and IVDU relapse who presents with hand pain for 2 days.  The patient was in her usual state of health until this week she developed URI symptoms of cough, yellow sputum with nasal congestion, runny nose, sore throat and headaches.  Then about two days ago, she started to develop pain and swelling in her left hand near the site of one of hte places she had injected drugs this week.  In the last two days, this hand has become more swollen, painful, and the swelling started to extend up the wrist so she came to the ER.  Assessment & Plan:   Principal Problem:   Sepsis (HCC) Active Problems:   Human immunodeficiency virus (HIV) disease (HCC)   Chronic hepatitis C without hepatic coma (HCC)   Major depressive disorder, recurrent episode (HCC)   Essential hypertension   Opioid abuse   Cellulitis of left hand   URI (upper respiratory infection)   Hyponatremia   IVDU (intravenous drug user)    Sepsis -Present on admission, fever 102.2, heart rate of 104 and presence of infection. -Blood cultures obtained -This is likely secondary to cellulitis of the left hand. -Started on broad-spectrum antibiotics and aggressive hydration with IV fluids. Sepsis physiology improving.  Cellulitis of left hand:  -Patient has a history of MRSA cellulitis, she was trying to inject herself with her when in the left snuffbox. -Developed fever, chills and sepsis. -Started on IV vancomycin and Zosyn. -Bedside ultrasound done in the emergency department and per Dr. Manus Gunningancour patient does not have abscess. -Continue current  antibiotics and obtain CT scan of hand to rule out abscess.  Rhinovirus URTI:  -Presented with cough, sputum production and shortness of breath. -Started on Robitussin and Mucinex. Symptomatic management.  HIV:  -Last CD4 count 350, viral load undetectable in February. Endorses daily compliance.  IV drug use:  Appears she has relapsed. Counseled extensively. Consult Child psychotherapistsocial worker for substance abuse.  Hyponatremia:  -Mild with sodium of 132, this is resolved after initiation of IV fluids.  Essential hypertension:  At goal -Hold amlodipine for now until hemodynamics clearer   DVT prophylaxis:  Code Status: Full Code Family Communication:  Disposition Plan:  Diet: Diet regular Room service appropriate? Yes; Fluid consistency: Thin  Consultants:   None  Procedures:   None  Antimicrobials:   None   Objective: Vitals:   09/27/16 1448 09/27/16 2142 09/28/16 0000 09/28/16 0400  BP: 107/61 128/63  131/74  Pulse: 81 86  72  Resp: 16 20  18   Temp: 98.8 F (37.1 C) (!) 101.3 F (38.5 C) 99.9 F (37.7 C) 98.6 F (37 C)  TempSrc:  Oral Oral Oral  SpO2: 100% 100%  97%  Weight:    67.1 kg (148 lb)  Height:        Intake/Output Summary (Last 24 hours) at 09/28/16 1059 Last data filed at 09/27/16 1852  Gross per 24 hour  Intake             2285 ml  Output                0 ml  Net             2285 ml   Filed Weights   09/27/16 0133 09/27/16 0601 09/28/16 0400  Weight: 63.5 kg (140 lb) 65 kg (143 lb 3.2 oz) 67.1 kg (148 lb)    Examination: General exam: Appears calm and comfortable  Respiratory system: Clear to auscultation. Respiratory effort normal. Cardiovascular system: S1 & S2 heard, RRR. No JVD, murmurs, rubs, gallops or clicks. No pedal edema. Gastrointestinal system: Abdomen is nondistended, soft and nontender. No organomegaly or masses felt. Normal bowel sounds heard. Central nervous system: Alert and oriented. No focal neurological  deficits. Extremities: Symmetric 5 x 5 power. Skin: No rashes, lesions or ulcers Psychiatry: Judgement and insight appear normal. Mood & affect appropriate.   Data Reviewed: I have personally reviewed following labs and imaging studies  CBC:  Recent Labs Lab 09/27/16 0135 09/28/16 0539  WBC 11.9* 8.8  NEUTROABS 6.4  --   HGB 12.1 11.3*  HCT 35.7* 34.4*  MCV 89.9 89.1  PLT 300 288   Basic Metabolic Panel:  Recent Labs Lab 09/27/16 0135 09/28/16 0539  NA 132* 138  K 3.8 3.8  CL 99* 106  CO2 23 25  GLUCOSE 164* 118*  BUN 5* <5*  CREATININE 0.94 0.69  CALCIUM 9.2 8.7*   GFR: Estimated Creatinine Clearance: 74 mL/min (by C-G formula based on SCr of 0.69 mg/dL). Liver Function Tests:  Recent Labs Lab 09/27/16 0135  AST 38  ALT 25  ALKPHOS 79  BILITOT 0.4  PROT 7.9  ALBUMIN 3.3*   No results for input(s): LIPASE, AMYLASE in the last 168 hours. No results for input(s): AMMONIA in the last 168 hours. Coagulation Profile: No results for input(s): INR, PROTIME in the last 168 hours. Cardiac Enzymes: No results for input(s): CKTOTAL, CKMB, CKMBINDEX, TROPONINI in the last 168 hours. BNP (last 3 results) No results for input(s): PROBNP in the last 8760 hours. HbA1C: No results for input(s): HGBA1C in the last 72 hours. CBG: No results for input(s): GLUCAP in the last 168 hours. Lipid Profile: No results for input(s): CHOL, HDL, LDLCALC, TRIG, CHOLHDL, LDLDIRECT in the last 72 hours. Thyroid Function Tests: No results for input(s): TSH, T4TOTAL, FREET4, T3FREE, THYROIDAB in the last 72 hours. Anemia Panel: No results for input(s): VITAMINB12, FOLATE, FERRITIN, TIBC, IRON, RETICCTPCT in the last 72 hours. Urine analysis:    Component Value Date/Time   COLORURINE AMBER (A) 09/27/2016 0140   APPEARANCEUR HAZY (A) 09/27/2016 0140   LABSPEC 1.026 09/27/2016 0140   PHURINE 5.0 09/27/2016 0140   GLUCOSEU NEGATIVE 09/27/2016 0140   GLUCOSEU NEG mg/dL 40/98/1191  4782   HGBUR SMALL (A) 09/27/2016 0140   BILIRUBINUR SMALL (A) 09/27/2016 0140   KETONESUR NEGATIVE 09/27/2016 0140   PROTEINUR >=300 (A) 09/27/2016 0140   UROBILINOGEN 0.2 02/22/2014 1200   NITRITE NEGATIVE 09/27/2016 0140   LEUKOCYTESUR NEGATIVE 09/27/2016 0140   Sepsis Labs: @LABRCNTIP (procalcitonin:4,lacticidven:4)  ) Recent Results (from the past 240 hour(s))  Urine culture     Status: Abnormal   Collection Time: 09/27/16  2:35 AM  Result Value Ref Range Status   Specimen Description URINE, RANDOM  Final   Special Requests NONE  Final   Culture (A)  Final    50,000 COLONIES/mL LACTOBACILLUS SPECIES Standardized susceptibility testing for this organism is not available.    Report Status 09/28/2016 FINAL  Final  Respiratory Panel by PCR     Status: Abnormal   Collection Time: 09/27/16  6:03 AM  Result Value Ref Range Status   Adenovirus NOT DETECTED NOT DETECTED Final   Coronavirus 229E NOT DETECTED NOT DETECTED Final   Coronavirus HKU1 NOT DETECTED NOT DETECTED Final   Coronavirus NL63 NOT DETECTED NOT DETECTED Final   Coronavirus OC43 NOT DETECTED NOT DETECTED Final   Metapneumovirus NOT DETECTED NOT DETECTED Final   Rhinovirus / Enterovirus DETECTED (A) NOT DETECTED Final   Influenza A NOT DETECTED NOT DETECTED Final   Influenza B NOT DETECTED NOT DETECTED Final   Parainfluenza Virus 1 NOT DETECTED NOT DETECTED Final   Parainfluenza Virus 2 NOT DETECTED NOT DETECTED Final   Parainfluenza Virus 3 NOT DETECTED NOT DETECTED Final   Parainfluenza Virus 4 NOT DETECTED NOT DETECTED Final   Respiratory Syncytial Virus NOT DETECTED NOT DETECTED Final   Bordetella pertussis NOT DETECTED NOT DETECTED Final   Chlamydophila pneumoniae NOT DETECTED NOT DETECTED Final   Mycoplasma pneumoniae NOT DETECTED NOT DETECTED Final  MRSA PCR Screening     Status: None   Collection Time: 09/27/16  6:03 AM  Result Value Ref Range Status   MRSA by PCR NEGATIVE NEGATIVE Final    Comment:         The GeneXpert MRSA Assay (FDA approved for NASAL specimens only), is one component of a comprehensive MRSA colonization surveillance program. It is not intended to diagnose MRSA infection nor to guide or monitor treatment for MRSA infections.      Invalid input(s): PROCALCITONIN, LACTICACIDVEN   Radiology Studies: Dg Chest 2 View  Result Date: 09/27/2016 CLINICAL DATA:  Cough and congestion. EXAM: CHEST  2 VIEW COMPARISON:  10/03/2010 FINDINGS: The cardiomediastinal contours are normal. Chronic borderline hyperinflation and bronchial thickening. Pulmonary vasculature is normal. No consolidation, pleural effusion, or pneumothorax. No acute osseous abnormalities are seen. IMPRESSION: Chronic mild bronchial thickening and borderline hyperinflation. No acute abnormality. Electronically Signed   By: Rubye Oaks M.D.   On: 09/27/2016 02:20   Dg Hand Complete Left  Result Date: 09/27/2016 CLINICAL DATA:  Left hand pain and swelling for 3 days. IV drug use. EXAM: LEFT HAND - COMPLETE 3+ VIEW COMPARISON:  None. FINDINGS: There is no evidence of fracture or dislocation. There is no evidence of arthropathy or other focal bone abnormality. Diffuse soft tissue edema of the hand. No radiopaque foreign body no tracking soft tissue air. IMPRESSION: Diffuse soft tissue edema. No osseous abnormality or radiopaque foreign body. Electronically Signed   By: Rubye Oaks M.D.   On: 09/27/2016 03:46        Scheduled Meds: . abacavir-dolutegravir-lamiVUDine  1 tablet Oral Daily  . darunavir-cobicistat  1 tablet Oral Q breakfast  . enoxaparin (LOVENOX) injection  40 mg Subcutaneous Q24H   Continuous Infusions: . sodium chloride    . vancomycin Stopped (09/28/16 1005)     LOS: 0 days    Time spent: 35 minutes    Brycin Kille A, MD [facility] Pager [phone]  If 7PM-7AM, please contact night-coverage [email or web address] Password [email or web address] 09/28/2016, 10:59 AM

## 2016-09-28 NOTE — Progress Notes (Signed)
Patient slept well last pm.  Pain medication given once for c/o left hand pain and tightness.  Pain medication effective in reducing pain to a tolerable level.  No changes in assessment noted.  All questions and concerns addressed.  Will continue to monitor and report off to day RN.

## 2016-09-28 NOTE — Progress Notes (Signed)
Patient left AMA, paper signed and on the chart. Courtney RearLonnie Pritesh Sobecki, MSN, RN, [employer]RN

## 2016-09-28 NOTE — Discharge Summary (Signed)
Physician Discharge Summary  Courtney Ellis ZOX:[id number] DOB: Feb 10, 1961 DOA: 09/27/2016  PCP: Patient, No Pcp Per  Admit date: 09/27/2016 Discharge date: 09/28/2016  Admitted From: Home Disposition:Left AMA  Recommendations for Outpatient Follow-up:  1. Follow up with PCP in 1-2 weeks 2. Please obtain BMP/CBC in one week 3. Patient left AMA  Home Health: NA Equipment/Devices:NA  Discharge Condition: Stable CODE STATUS: Full Code Diet recommendation: Diet regular Room service appropriate? Yes; Fluid consistency: Thin  Brief/Interim Summary: Courtney Ellis a 56 y.o.femalewith a past medical history significant for HIV on treatment, Hep C, depression and IVDU relapsewho presents with hand pain for 2 days.  The patient was in her usual state of health until this week she developed URI symptoms of cough, yellow sputum with nasal congestion, runny nose, sore throat and headaches. Then about two days ago, she started to develop pain and swelling in her left hand near the site of one of hte places she had injected drugs this week. In the last two days, this hand has become more swollen, painful, and the swelling started to extend up the wrist so she came to the ER.  Discharge Diagnoses:  Principal Problem:   Sepsis (HCC) Active Problems:   Human immunodeficiency virus (HIV) disease (HCC)   Chronic hepatitis C without hepatic coma (HCC)   Major depressive disorder, recurrent episode (HCC)   Essential hypertension   Opioid abuse   Cellulitis of left hand   URI (upper respiratory infection)   Hyponatremia   IVDU (intravenous drug user)   She left AMA  Discharge Instructions   Allergies as of 09/28/2016   No Known Allergies   Med Rec must be completed prior to using this SMARTLINK**     No Known Allergies  Consultations:  None   Procedures (Echo, Carotid, EGD, Colonoscopy, ERCP)   Radiological studies: Dg Chest 2 View  Result Date: 09/27/2016 CLINICAL DATA:   Cough and congestion. EXAM: CHEST  2 VIEW COMPARISON:  10/03/2010 FINDINGS: The cardiomediastinal contours are normal. Chronic borderline hyperinflation and bronchial thickening. Pulmonary vasculature is normal. No consolidation, pleural effusion, or pneumothorax. No acute osseous abnormalities are seen. IMPRESSION: Chronic mild bronchial thickening and borderline hyperinflation. No acute abnormality. Electronically Signed   By: Rubye Oaks M.D.   On: 09/27/2016 02:20   Dg Hand Complete Left  Result Date: 09/27/2016 CLINICAL DATA:  Left hand pain and swelling for 3 days. IV drug use. EXAM: LEFT HAND - COMPLETE 3+ VIEW COMPARISON:  None. FINDINGS: There is no evidence of fracture or dislocation. There is no evidence of arthropathy or other focal bone abnormality. Diffuse soft tissue edema of the hand. No radiopaque foreign body no tracking soft tissue air. IMPRESSION: Diffuse soft tissue edema. No osseous abnormality or radiopaque foreign body. Electronically Signed   By: Rubye Oaks M.D.   On: 09/27/2016 03:46     Subjective:  Discharge Exam: Vitals:   09/27/16 1448 09/27/16 2142 09/28/16 0000 09/28/16 0400  BP: 107/61 128/63  131/74  Pulse: 81 86  72  Resp: 16 20  18   Temp: 98.8 F (37.1 C) (!) 101.3 F (38.5 C) 99.9 F (37.7 C) 98.6 F (37 C)  TempSrc:  Oral Oral Oral  SpO2: 100% 100%  97%  Weight:    67.1 kg (148 lb)  Height:       General: Pt is alert, awake, not in acute distress Cardiovascular: RRR, S1/S2 +, no rubs, no gallops Respiratory: CTA bilaterally, no wheezing, no rhonchi Abdominal:  Soft, NT, ND, bowel sounds + Extremities: no edema, no cyanosis   The results of significant diagnostics from this hospitalization (including imaging, microbiology, ancillary and laboratory) are listed below for reference.    Microbiology: Recent Results (from the past 240 hour(s))  Urine culture     Status: Abnormal   Collection Time: 09/27/16  2:35 AM  Result Value Ref  Range Status   Specimen Description URINE, RANDOM  Final   Special Requests NONE  Final   Culture (A)  Final    50,000 COLONIES/mL LACTOBACILLUS SPECIES Standardized susceptibility testing for this organism is not available.    Report Status 09/28/2016 FINAL  Final  Respiratory Panel by PCR     Status: Abnormal   Collection Time: 09/27/16  6:03 AM  Result Value Ref Range Status   Adenovirus NOT DETECTED NOT DETECTED Final   Coronavirus 229E NOT DETECTED NOT DETECTED Final   Coronavirus HKU1 NOT DETECTED NOT DETECTED Final   Coronavirus NL63 NOT DETECTED NOT DETECTED Final   Coronavirus OC43 NOT DETECTED NOT DETECTED Final   Metapneumovirus NOT DETECTED NOT DETECTED Final   Rhinovirus / Enterovirus DETECTED (A) NOT DETECTED Final   Influenza A NOT DETECTED NOT DETECTED Final   Influenza B NOT DETECTED NOT DETECTED Final   Parainfluenza Virus 1 NOT DETECTED NOT DETECTED Final   Parainfluenza Virus 2 NOT DETECTED NOT DETECTED Final   Parainfluenza Virus 3 NOT DETECTED NOT DETECTED Final   Parainfluenza Virus 4 NOT DETECTED NOT DETECTED Final   Respiratory Syncytial Virus NOT DETECTED NOT DETECTED Final   Bordetella pertussis NOT DETECTED NOT DETECTED Final   Chlamydophila pneumoniae NOT DETECTED NOT DETECTED Final   Mycoplasma pneumoniae NOT DETECTED NOT DETECTED Final  MRSA PCR Screening     Status: None   Collection Time: 09/27/16  6:03 AM  Result Value Ref Range Status   MRSA by PCR NEGATIVE NEGATIVE Final    Comment:        The GeneXpert MRSA Assay (FDA approved for NASAL specimens only), is one component of a comprehensive MRSA colonization surveillance program. It is not intended to diagnose MRSA infection nor to guide or monitor treatment for MRSA infections.      Labs: BNP (last 3 results) No results for input(s): BNP in the last 8760 hours. Basic Metabolic Panel:  Recent Labs Lab 09/27/16 0135 09/28/16 0539  NA 132* 138  K 3.8 3.8  CL 99* 106  CO2 23 25   GLUCOSE 164* 118*  BUN 5* <5*  CREATININE 0.94 0.69  CALCIUM 9.2 8.7*   Liver Function Tests:  Recent Labs Lab 09/27/16 0135  AST 38  ALT 25  ALKPHOS 79  BILITOT 0.4  PROT 7.9  ALBUMIN 3.3*   No results for input(s): LIPASE, AMYLASE in the last 168 hours. No results for input(s): AMMONIA in the last 168 hours. CBC:  Recent Labs Lab 09/27/16 0135 09/28/16 0539  WBC 11.9* 8.8  NEUTROABS 6.4  --   HGB 12.1 11.3*  HCT 35.7* 34.4*  MCV 89.9 89.1  PLT 300 288   Cardiac Enzymes: No results for input(s): CKTOTAL, CKMB, CKMBINDEX, TROPONINI in the last 168 hours. BNP: Invalid input(s): POCBNP CBG: No results for input(s): GLUCAP in the last 168 hours. D-Dimer No results for input(s): DDIMER in the last 72 hours. Hgb A1c No results for input(s): HGBA1C in the last 72 hours. Lipid Profile No results for input(s): CHOL, HDL, LDLCALC, TRIG, CHOLHDL, LDLDIRECT in the last 72 hours. Thyroid function  studies No results for input(s): TSH, T4TOTAL, T3FREE, THYROIDAB in the last 72 hours.  Invalid input(s): FREET3 Anemia work up No results for input(s): VITAMINB12, FOLATE, FERRITIN, TIBC, IRON, RETICCTPCT in the last 72 hours. Urinalysis    Component Value Date/Time   COLORURINE AMBER (A) 09/27/2016 0140   APPEARANCEUR HAZY (A) 09/27/2016 0140   LABSPEC 1.026 09/27/2016 0140   PHURINE 5.0 09/27/2016 0140   GLUCOSEU NEGATIVE 09/27/2016 0140   GLUCOSEU NEG mg/dL 16/01/9603 5409   HGBUR SMALL (A) 09/27/2016 0140   BILIRUBINUR SMALL (A) 09/27/2016 0140   KETONESUR NEGATIVE 09/27/2016 0140   PROTEINUR >=300 (A) 09/27/2016 0140   UROBILINOGEN 0.2 02/22/2014 1200   NITRITE NEGATIVE 09/27/2016 0140   LEUKOCYTESUR NEGATIVE 09/27/2016 0140   Sepsis Labs Invalid input(s): PROCALCITONIN,  WBC,  LACTICIDVEN Microbiology Recent Results (from the past 240 hour(s))  Urine culture     Status: Abnormal   Collection Time: 09/27/16  2:35 AM  Result Value Ref Range Status    Specimen Description URINE, RANDOM  Final   Special Requests NONE  Final   Culture (A)  Final    50,000 COLONIES/mL LACTOBACILLUS SPECIES Standardized susceptibility testing for this organism is not available.    Report Status 09/28/2016 FINAL  Final  Respiratory Panel by PCR     Status: Abnormal   Collection Time: 09/27/16  6:03 AM  Result Value Ref Range Status   Adenovirus NOT DETECTED NOT DETECTED Final   Coronavirus 229E NOT DETECTED NOT DETECTED Final   Coronavirus HKU1 NOT DETECTED NOT DETECTED Final   Coronavirus NL63 NOT DETECTED NOT DETECTED Final   Coronavirus OC43 NOT DETECTED NOT DETECTED Final   Metapneumovirus NOT DETECTED NOT DETECTED Final   Rhinovirus / Enterovirus DETECTED (A) NOT DETECTED Final   Influenza A NOT DETECTED NOT DETECTED Final   Influenza B NOT DETECTED NOT DETECTED Final   Parainfluenza Virus 1 NOT DETECTED NOT DETECTED Final   Parainfluenza Virus 2 NOT DETECTED NOT DETECTED Final   Parainfluenza Virus 3 NOT DETECTED NOT DETECTED Final   Parainfluenza Virus 4 NOT DETECTED NOT DETECTED Final   Respiratory Syncytial Virus NOT DETECTED NOT DETECTED Final   Bordetella pertussis NOT DETECTED NOT DETECTED Final   Chlamydophila pneumoniae NOT DETECTED NOT DETECTED Final   Mycoplasma pneumoniae NOT DETECTED NOT DETECTED Final  MRSA PCR Screening     Status: None   Collection Time: 09/27/16  6:03 AM  Result Value Ref Range Status   MRSA by PCR NEGATIVE NEGATIVE Final    Comment:        The GeneXpert MRSA Assay (FDA approved for NASAL specimens only), is one component of a comprehensive MRSA colonization surveillance program. It is not intended to diagnose MRSA infection nor to guide or monitor treatment for MRSA infections.      Time coordinating discharge: Over 30 minutes  SIGNED:   Clint Lipps, MD  [facility] 09/28/2016, 12:04 PM Pager   If 7PM-7AM, please contact night-coverage [email or web address] Password [email or web address]

## 2016-09-28 NOTE — Progress Notes (Signed)
Patient called me to room to remove IV's. States she wants to leave AMA. Tried to convince her to stay and asked what we could do to help her want to stay. She stated she just wanted to go to another hospital. Dr Arthor CaptainElmahi was paged. Awaiting his return call, but patient will probably be gone before Dr Arthor CaptainElmahi can get here. She is persistant about leaving now. IV's were removed per patients request. Madelin RearLonnie Jenina Moening, MSN, RN, [facility] Center[facility]RN

## 2016-09-29 ENCOUNTER — Encounter: Payer: Self-pay | Admitting: Infectious Diseases

## 2016-09-29 ENCOUNTER — Inpatient Hospital Stay (HOSPITAL_COMMUNITY): Payer: Medicaid Other

## 2016-09-29 ENCOUNTER — Inpatient Hospital Stay (HOSPITAL_COMMUNITY)
Admission: AD | Admit: 2016-09-29 | Discharge: 2016-10-04 | DRG: 969 | Disposition: A | Discharge status: Home Health | Payer: Medicaid Other | Source: Ambulatory Visit | Attending: Internal Medicine | Admitting: Internal Medicine

## 2016-09-29 ENCOUNTER — Telehealth: Payer: Self-pay | Admitting: *Deleted

## 2016-09-29 ENCOUNTER — Encounter (HOSPITAL_COMMUNITY): Payer: Self-pay

## 2016-09-29 ENCOUNTER — Ambulatory Visit (INDEPENDENT_AMBULATORY_CARE_PROVIDER_SITE_OTHER): Payer: Medicaid Other | Admitting: Infectious Diseases

## 2016-09-29 DIAGNOSIS — F329 Major depressive disorder, single episode, unspecified: Secondary | ICD-10-CM | POA: Diagnosis present

## 2016-09-29 DIAGNOSIS — F17210 Nicotine dependence, cigarettes, uncomplicated: Secondary | ICD-10-CM | POA: Diagnosis present

## 2016-09-29 DIAGNOSIS — G4709 Other insomnia: Secondary | ICD-10-CM

## 2016-09-29 DIAGNOSIS — A419 Sepsis, unspecified organism: Secondary | ICD-10-CM | POA: Diagnosis present

## 2016-09-29 DIAGNOSIS — M609 Myositis, unspecified: Secondary | ICD-10-CM | POA: Diagnosis present

## 2016-09-29 DIAGNOSIS — B182 Chronic viral hepatitis C: Secondary | ICD-10-CM | POA: Diagnosis present

## 2016-09-29 DIAGNOSIS — L02512 Cutaneous abscess of left hand: Secondary | ICD-10-CM | POA: Diagnosis present

## 2016-09-29 DIAGNOSIS — I10 Essential (primary) hypertension: Secondary | ICD-10-CM | POA: Diagnosis present

## 2016-09-29 DIAGNOSIS — B20 Human immunodeficiency virus [HIV] disease: Secondary | ICD-10-CM | POA: Diagnosis present

## 2016-09-29 DIAGNOSIS — L03114 Cellulitis of left upper limb: Secondary | ICD-10-CM

## 2016-09-29 DIAGNOSIS — Z79899 Other long term (current) drug therapy: Secondary | ICD-10-CM

## 2016-09-29 DIAGNOSIS — F419 Anxiety disorder, unspecified: Secondary | ICD-10-CM | POA: Diagnosis present

## 2016-09-29 DIAGNOSIS — M7989 Other specified soft tissue disorders: Secondary | ICD-10-CM

## 2016-09-29 DIAGNOSIS — F1910 Other psychoactive substance abuse, uncomplicated: Secondary | ICD-10-CM | POA: Diagnosis not present

## 2016-09-29 DIAGNOSIS — F1110 Opioid abuse, uncomplicated: Secondary | ICD-10-CM | POA: Diagnosis present

## 2016-09-29 DIAGNOSIS — F1990 Other psychoactive substance use, unspecified, uncomplicated: Secondary | ICD-10-CM | POA: Diagnosis not present

## 2016-09-29 DIAGNOSIS — E876 Hypokalemia: Secondary | ICD-10-CM | POA: Diagnosis not present

## 2016-09-29 DIAGNOSIS — D72829 Elevated white blood cell count, unspecified: Secondary | ICD-10-CM | POA: Diagnosis present

## 2016-09-29 LAB — COMPREHENSIVE METABOLIC PANEL
ALBUMIN: 3 g/dL — AB (ref 3.5–5.0)
ALK PHOS: 72 U/L (ref 38–126)
ALT: 20 U/L (ref 14–54)
ANION GAP: 8 (ref 5–15)
AST: 35 U/L (ref 15–41)
BUN: 7 mg/dL (ref 6–20)
CHLORIDE: 99 mmol/L — AB (ref 101–111)
CO2: 25 mmol/L (ref 22–32)
Calcium: 8.6 mg/dL — ABNORMAL LOW (ref 8.9–10.3)
Creatinine, Ser: 0.74 mg/dL (ref 0.44–1.00)
GFR calc non Af Amer: >60 mL/min (ref >60)
GLUCOSE: 101 mg/dL — AB (ref 65–99)
Potassium: 3.9 mmol/L (ref 3.5–5.1)
SODIUM: 132 mmol/L — AB (ref 135–145)
Total Bilirubin: 1.2 mg/dL (ref 0.3–1.2)
Total Protein: 7.3 g/dL (ref 6.5–8.1)

## 2016-09-29 LAB — RAPID URINE DRUG SCREEN, HOSP PERFORMED
AMPHETAMINES: NOT DETECTED
Barbiturates: NOT DETECTED
Benzodiazepines: POSITIVE — AB
COCAINE: NOT DETECTED
OPIATES: POSITIVE — AB
TETRAHYDROCANNABINOL: NOT DETECTED

## 2016-09-29 LAB — PHOSPHORUS: PHOSPHORUS: 3.1 mg/dL (ref 2.5–4.6)

## 2016-09-29 LAB — MAGNESIUM: Magnesium: 1.4 mg/dL — ABNORMAL LOW (ref 1.7–2.4)

## 2016-09-29 LAB — ETHANOL: Alcohol, Ethyl (B): <5 mg/dL (ref <5)

## 2016-09-29 MED ORDER — TRAZODONE HCL 50 MG PO TABS
100.0000 mg | ORAL_TABLET | Freq: Every day | ORAL | Status: DC
  Administered 2016-09-29: 100 mg via ORAL
  Filled 2016-09-29 (×3): qty 2

## 2016-09-29 MED ORDER — ABACAVIR-DOLUTEGRAVIR-LAMIVUD 600-50-300 MG PO TABS
1.0000 | ORAL_TABLET | Freq: Every day | ORAL | Status: DC
  Administered 2016-09-29 – 2016-10-04 (×6): 1 via ORAL
  Filled 2016-09-29 (×6): qty 1

## 2016-09-29 MED ORDER — DARUNAVIR-COBICISTAT 800-150 MG PO TABS
1.0000 | ORAL_TABLET | Freq: Every day | ORAL | Status: DC
  Administered 2016-09-29: 1 via ORAL
  Filled 2016-09-29 (×2): qty 1

## 2016-09-29 MED ORDER — ENOXAPARIN SODIUM 30 MG/0.3ML ~~LOC~~ SOLN: 30.0000 mg | SUBCUTANEOUS | Status: DC

## 2016-09-29 MED ORDER — ONDANSETRON HCL 4 MG PO TABS
4.0000 mg | ORAL_TABLET | Freq: Four times a day (QID) | ORAL | Status: DC | PRN
  Administered 2016-09-30: 4 mg via ORAL
  Filled 2016-09-29: qty 1

## 2016-09-29 MED ORDER — GADOBENATE DIMEGLUMINE 529 MG/ML IV SOLN
15.0000 mL | Freq: Once | INTRAVENOUS | Status: AC | PRN
  Administered 2016-09-29: 14 mL via INTRAVENOUS

## 2016-09-29 MED ORDER — DIAZEPAM 5 MG PO TABS
5.0000 mg | ORAL_TABLET | Freq: Every evening | ORAL | Status: DC | PRN
Start: 2016-09-29 — End: 2016-10-04
  Administered 2016-09-29 – 2016-10-03 (×4): 5 mg via ORAL
  Filled 2016-09-29 (×4): qty 1

## 2016-09-29 MED ORDER — SODIUM CHLORIDE 0.9 % IV SOLN
INTRAVENOUS | Status: DC
  Administered 2016-09-29: 12:00:00 via INTRAVENOUS
  Administered 2016-09-30: 1000 mL via INTRAVENOUS
  Administered 2016-10-01: via INTRAVENOUS
  Administered 2016-10-01: 1000 mL via INTRAVENOUS
  Administered 2016-10-02: 03:00:00 via INTRAVENOUS

## 2016-09-29 MED ORDER — AMLODIPINE BESYLATE 5 MG PO TABS
5.0000 mg | ORAL_TABLET | Freq: Every day | ORAL | Status: DC
  Administered 2016-09-29 – 2016-10-04 (×6): 5 mg via ORAL
  Filled 2016-09-29 (×7): qty 1

## 2016-09-29 MED ORDER — ALBUTEROL SULFATE (2.5 MG/3ML) 0.083% IN NEBU: 3.0000 mL | INHALATION_SOLUTION | RESPIRATORY_TRACT | Status: DC | PRN

## 2016-09-29 MED ORDER — LORATADINE 10 MG PO TABS
10.0000 mg | ORAL_TABLET | Freq: Every day | ORAL | Status: DC
  Administered 2016-09-29 – 2016-10-04 (×6): 10 mg via ORAL
  Filled 2016-09-29 (×6): qty 1

## 2016-09-29 MED ORDER — ONDANSETRON HCL 4 MG/2ML IJ SOLN: 4.0000 mg | Freq: Four times a day (QID) | INTRAMUSCULAR | Status: DC | PRN

## 2016-09-29 MED ORDER — ACETAMINOPHEN 325 MG PO TABS
650.0000 mg | ORAL_TABLET | Freq: Four times a day (QID) | ORAL | Status: DC | PRN
  Administered 2016-09-29: 650 mg via ORAL
  Filled 2016-09-29: qty 2

## 2016-09-29 MED ORDER — ACETAMINOPHEN 650 MG RE SUPP: 650.0000 mg | Freq: Four times a day (QID) | RECTAL | Status: DC | PRN

## 2016-09-29 MED ORDER — CLINDAMYCIN PHOSPHATE 600 MG/50ML IV SOLN
600.0000 mg | Freq: Three times a day (TID) | INTRAVENOUS | Status: DC
  Administered 2016-09-29 – 2016-10-03 (×11): 600 mg via INTRAVENOUS
  Filled 2016-09-29 (×11): qty 50

## 2016-09-29 NOTE — Assessment & Plan Note (Signed)
She is aware she needs to be clean to get rx.  Will f/u with Dr Luciana Axeomer.

## 2016-09-29 NOTE — Assessment & Plan Note (Signed)
Takes trazodone prn.  

## 2016-09-29 NOTE — Telephone Encounter (Signed)
[facility] Dr Ninetta LightsHatcher, room [phone].  She was given directions, instructions how to admit.  Patient will pick up belongings, notify her children, [facility]. Andree CossHowell, Herta Hink M, RN

## 2016-09-29 NOTE — Assessment & Plan Note (Signed)
She is doing very well.  Will continue her current ART.  Will f/u with Dr Luciana Axeomer

## 2016-09-29 NOTE — Telephone Encounter (Signed)
Patient walked into clinic today and was added to Dr. Ephriam Knucklesomer's schedule. Courtney Ellis CMA

## 2016-09-29 NOTE — Assessment & Plan Note (Signed)
This appears very swollen and worsen by her hx.  She is advised to be adm (has hesitation as she does not want to miss her son's graduation on Thursday).  [facility] She will need MRI and probably hand surgery eval.

## 2016-09-29 NOTE — H&P (Addendum)
History and Physical    Courtney Ellis Courtney Ellis DOB: 12-29-1960 DOA: 09/29/2016  Referring MD/NP/PA: Dr. Johny Sax   PCP: Adah Salvage, NP   Outpatient Specialists: ID, Dr. Staci Righter   Patient coming from: home, ID   Chief Complaint: left arm swelling   HPI: Courtney Ellis is a 56 y.o. female with medical history significant for HIV on ART, hypertension, hep C, IVDU who was just recently hospitalized in [facility] (6/9 through 6/10) for sepsis what appears to be due to rhinovirus and left arm cellulitis. She subsequently left AGAINST MEDICAL ADVICE. Patient then presented to infectious disease clinic, arm is more swollen, tender to touch. Patient reported associated fevers at home but not sure of the temperature. She also had chills. No other complaints such as chest pain, cough, shortness of breath or palpitations. No nausea or vomiting or abdominal pain. No lightheadedness or loss of consciousness.    Pt was admitted directly from ID clinic so admission labs and diagnostic   Review of Systems:  Constitutional: positive for fever, chills, no diaphoresis, activity change, appetite change and fatigue.  HENT: Negative for ear pain, nosebleeds, congestion, facial swelling, rhinorrhea, neck pain, neck stiffness and ear discharge.   Eyes: Negative for pain, discharge, redness, itching and visual disturbance.  Respiratory: Negative for cough, choking, chest tightness, shortness of breath, wheezing and stridor.   Cardiovascular: Negative for chest pain, palpitations and leg swelling.  Gastrointestinal: Negative for abdominal distention.  Genitourinary: Negative for dysuria, urgency, frequency, hematuria, flank pain, decreased urine volume, difficulty urinating and dyspareunia.  Musculoskeletal: Negative for back pain, joint swelling, arthralgias and gait problem.  Neurological: Negative for dizziness, tremors, seizures, syncope, facial asymmetry, speech difficulty, weakness,  light-headedness, numbness and headaches.  Hematological: Negative for adenopathy. Does not bruise/bleed easily.  Psychiatric/Behavioral: Negative for hallucinations, behavioral problems, confusion, dysphoric mood, decreased concentration and agitation.   Past Medical History:  Diagnosis Date  . Anxiety   . Depression   . Hepatitis    C  . Heroin abuse   . HIV infection (HCC)   . Hypertension     Past Surgical History:  Procedure Laterality Date  . CESAREAN SECTION    . DILATION AND CURETTAGE OF UTERUS N/A 06/27/2016   Procedure: Removal Erasmo Leventhal Body Vagina;  Surgeon: Myna Hidalgo, DO;  Location: [facility];  Service: Gynecology;  Laterality: N/A;    Social history:  reports that she has been smoking Cigarettes.  She has been smoking about 0.20 packs per day. She has never used smokeless tobacco. She reports that she uses drugs, including IV. She reports that she does not drink alcohol.  Ambulation: ambulates without assistance.  No Known Allergies  Family History  Problem Relation Age of Onset  . Hypertension Mother   . Alcohol abuse Mother   . Depression Mother   . Diabetes Mother   . Drug abuse Mother   . Alcohol abuse Father   . Cancer Father   . Depression Father   . Drug abuse Father   . Depression Sister   . Diabetes Sister   . Drug abuse Sister   . Mental illness Sister   . Depression Brother   . Drug abuse Brother   . Mental illness Brother   . Depression Maternal Aunt   . Drug abuse Maternal Aunt   . Depression Maternal Uncle   . Drug abuse Maternal Uncle   . Depression Paternal Aunt   . Diabetes Paternal Aunt   . Drug abuse  Paternal Aunt   . Stroke Paternal Aunt   . Alcohol abuse Paternal Uncle   . Depression Paternal Uncle   . Diabetes Paternal Uncle   . Drug abuse Paternal Uncle   . Kidney disease Paternal Uncle     Prior to Admission medications   Medication Sig Start Date End Date Taking? Authorizing Provider  amLODipine (NORVASC) 5 MG tablet  TAKE 1 TABLET (5 MG TOTAL) BY MOUTH DAILY. 08/13/16   Gardiner Barefootomer, Robert W, MD  cetirizine (ZYRTEC) 10 MG tablet Take 1 tablet (10 mg total) by mouth daily. 07/16/16   Comer, Belia Hemanobert W, MD  diazepam (VALIUM) 5 MG tablet Take 1 tablet (5 mg total) by mouth at bedtime as needed for anxiety. 07/21/16   Comer, Belia Hemanobert W, MD  PREZCOBIX 800-150 MG tablet TAKE 1 TABLET BY MOUTH DAILY. SWALLOW WHOLE. DO NOT CRUSH, BREAK OR CHEW TABLETS. TAKE WITH FOOD. 02/18/16   Gardiner Barefootomer, Robert W, MD  PROVENTIL HFA 108 [phone](90 Base) MCG/ACT inhaler INHALE TWO PUFFS INTO LUNGS EVERY SIX HOURS AS NEEDED FOR WHEEZING OR SHORTNESS OF BREATH 05/06/16   Gardiner Barefootomer, Robert W, MD  traZODone (DESYREL) 100 MG tablet Take 1 tablet (100 mg total) by mouth at bedtime. 06/12/16   Comer, Belia Hemanobert W, MD  TRIUMEQ 600-50-300 MG tablet TAKE 1 TABLET BY MOUTH DAILY. 08/13/16   Gardiner Barefootomer, Robert W, MD    Physical Exam: Vitals:   09/29/16 1033  BP: 136/78  Pulse: 100  Resp: 20  Temp: 99.8 F (37.7 C)  TempSrc: Oral  SpO2: 100%  Weight: 65.8 kg (145 lb)  Height: 5\' 3"  (1.6 m)    Constitutional: NAD, calm, comfortable Vitals:   09/29/16 1033  BP: 136/78  Pulse: 100  Resp: 20  Temp: 99.8 F (37.7 C)  TempSrc: Oral  SpO2: 100%  Weight: 65.8 kg (145 lb)  Height: 5\' 3"  (1.6 m)   Eyes: PERRL, lids and conjunctivae normal ENMT: Mucous membranes are moist. Posterior pharynx clear of any exudate or lesions.Normal dentition.  Neck: normal, supple, no masses, no thyromegaly Respiratory: clear to auscultation bilaterally, no wheezing, no crackles. Normal respiratory effort. No accessory muscle use.  Cardiovascular: Regular rate and rhythm, no murmurs / rubs / gallops. No extremity edema. 2+ pedal pulses. No carotid bruits.  Abdomen: no tenderness, no masses palpated. No hepatosplenomegaly. Bowel sounds positive.  Musculoskeletal: no clubbing / cyanosis. No joint deformity upper and lower extremities. Good ROM, no contractures. Normal muscle tone.  Skin: left  upper extremity swollen, tender and warm to touch, palpable pulses  Neurologic: CN 2-12 grossly intact. Sensation intact, DTR normal. Strength 5/5 in all 4.  Psychiatric: Normal judgment and insight. Alert and oriented x 3. Normal mood.   Labs on Admission: I have personally reviewed following labs and imaging studies  CBC:  Recent Labs Lab 09/27/16 0135 09/28/16 0539  WBC 11.9* 8.8  NEUTROABS 6.4  --   HGB 12.1 11.3*  HCT 35.7* 34.4*  MCV 89.9 89.1  PLT 300 288   Basic Metabolic Panel:  Recent Labs Lab 09/27/16 0135 09/28/16 0539  NA 132* 138  K 3.8 3.8  CL 99* 106  CO2 23 25  GLUCOSE 164* 118*  BUN 5* <5*  CREATININE 0.94 0.69  CALCIUM 9.2 8.7*   GFR: Estimated Creatinine Clearance: 73.4 mL/min (by C-G formula based on SCr of 0.69 mg/dL). Liver Function Tests:  Recent Labs Lab 09/27/16 0135  AST 38  ALT 25  ALKPHOS 79  BILITOT 0.4  PROT 7.9  ALBUMIN 3.3*     Sepsis Labs:  Culture, blood (Routine x 2)     Status: None (Preliminary result)   Collection Time: 09/27/16  1:20 AM  Result Value Ref Range Status   Specimen Description BLOOD RIGHT ARM  Final   Culture NO GROWTH 1 DAY  Final   Report Status PENDING  Incomplete  Culture, blood (Routine x 2)     Status: None (Preliminary result)   Collection Time: 09/27/16  1:39 AM  Result Value Ref Range Status   Specimen Description BLOOD RIGHT HAND  Final   Special Requests   Final   Culture NO GROWTH 1 DAY  Final   Report Status PENDING  Incomplete  Urine culture     Status: Abnormal   Collection Time: 09/27/16  2:35 AM  Result Value Ref Range Status   Specimen Description URINE, RANDOM  Final   Special Requests NONE  Final   Culture (A)  Final    50,000 COLONIES/mL LACTOBACILLUS SPECIES Standardized susceptibility testing for this organism is not available.    Report Status 09/28/2016 FINAL  Final  MRSA PCR Screening     Status: None   Collection Time: 09/27/16  6:03 AM  Result Value Ref Range  Status   MRSA by PCR NEGATIVE NEGATIVE Final     Radiological Exams on Admission: No results found.  EKG: not done   Assessment/Plan  Principal Problem:   Left arm swelling and cellulitis / Leukocytosis - Patient has toxic appearing upper left extremity swelling, cellulitis - Cellulitis order set utilized - Patient started on clindamycin - Obtain MRI per ID to rule out an abscess - Obtain upper extremity doppler to rule out thrombosis  - Follow-up blood culture results, her more recent blood cultures were negative on previous hospitalization   Active Problems:   Human immunodeficiency virus (HIV) disease (HCC) - Resume HAART    Chronic hepatitis C without hepatic coma (HCC) - Management per ID    Essential hypertension - Resume Norvasc     Substance abuse / Opioid abuse / IVDU (intravenous drug user) - Obtain UDS and ethanol level - Counseled on substance abuse cessation      DVT prophylaxis: Lovenox subQ Code Status: full code Family Communication: no family at the bedside  Disposition Plan: admission to medical floor  Consults called: none   Disposition plan: Further plan will depend as patient's clinical course evolves and further radiologic and laboratory data become available.    At the time of admission, it appears that the appropriate admission status for this patient is INPATIENT .Thisis judged to be reasonable and necessary in order to provide the required intensity of service to ensure the patient's safetygiven the patient presentation of left arm cellulitis, toxic appearing in addition to physical exam findings, radiographic and laboratory data in the context of chronic comorbidities.    Manson Passey MD [facility] Pager [phone]   If 7PM-7AM, please contact night-coverage [email or web address] Password [facility]  09/29/2016, 11:26 AM

## 2016-09-29 NOTE — Assessment & Plan Note (Signed)
She is aware she needs to quit I asked her about rehab, she has done before, did not enjoy.

## 2016-09-29 NOTE — Telephone Encounter (Signed)
-----   Message from Gardiner Barefootobert W Comer, MD sent at 09/29/2016  8:01 AM EDT ----- She left the hospital AMA with hand cellulitis, IVD relapse.  Can you see if she can come in to meet with Cordelia PenSherry and maybe have her come for follow up Monday or Tuesday next week with Judeth CornfieldStephanie to check her hand (when I am there too), check medication compliance.  thanks

## 2016-09-29 NOTE — Progress Notes (Signed)
   Subjective:    Patient ID: Courtney Ellis, female    DOB: 04/28/1960, 56 y.o.   MRN: [id number]  HPI 56 yo F with hx of IVDA and Hep C (1a, never treated), HIV+, was in ED on 6-9 with L hand swelling after IVDA in previous week.  She left ED AMA after she thought they weren't giving her anbx.  WBC was 8.8, plain film showed diffuse edema/no forieng body. Her viral resp panel showed rhinovirus.  Her BCx are ngtd.  She developed fever today and significant worsening of hand swelling.   She denies missed ART, no ADR.  HIV 1 RNA Quant (copies/mL)  Date Value  06/12/2016 <20 DETECTED (A)  02/11/2016 <20  08/21/2015 <20   CD4 T Cell Abs (/uL)  Date Value  06/12/2016 350 (L)  02/11/2016 320 (L)  08/21/2015 360 (L)    Review of Systems  Constitutional: Positive for chills and fever. Negative for appetite change and unexpected weight change.  Respiratory: Positive for cough. Negative for shortness of breath.   Cardiovascular: Negative for leg swelling.  Gastrointestinal: Negative for constipation and diarrhea.  Genitourinary: Negative for difficulty urinating, menstrual problem and vaginal bleeding.  Musculoskeletal: Positive for arthralgias and joint swelling.       Objective:   Physical Exam  Constitutional: She appears well-developed and well-nourished.  HENT:  Mouth/Throat: No oropharyngeal exudate.  Eyes: EOM are normal. Pupils are equal, round, and reactive to light.  Neck: Neck supple.  Cardiovascular: Normal rate, regular rhythm and normal heart sounds.   Pulmonary/Chest: Effort normal and breath sounds normal.  Abdominal: Soft. Bowel sounds are normal. There is no tenderness. There is no rebound.  Musculoskeletal:       Arms: Lymphadenopathy:    She has no cervical adenopathy.      Assessment & Plan:

## 2016-09-29 NOTE — Progress Notes (Signed)
*  Preliminary Results* Left upper extremity venous duplex completed. Left upper extremity is negative for deep and superficial vein thrombosis.  09/29/2016 3:36 PM  Gertie FeyMichelle Joud Ingwersen, BS, RVT, RDCS, RDMS

## 2016-09-30 ENCOUNTER — Encounter (HOSPITAL_COMMUNITY)
Admission: AD | Disposition: A | Discharge status: Home Health | Payer: Self-pay | Source: Ambulatory Visit | Attending: Internal Medicine

## 2016-09-30 ENCOUNTER — Inpatient Hospital Stay (HOSPITAL_COMMUNITY): Payer: Medicaid Other | Admitting: Registered Nurse

## 2016-09-30 ENCOUNTER — Encounter (HOSPITAL_COMMUNITY): Payer: Self-pay | Admitting: Registered Nurse

## 2016-09-30 HISTORY — PX: I & D EXTREMITY: SHX5045

## 2016-09-30 LAB — CBC
HCT: 31.8 % — ABNORMAL LOW (ref 36.0–46.0)
Hemoglobin: 10.7 g/dL — ABNORMAL LOW (ref 12.0–15.0)
MCH: 29.8 pg (ref 26.0–34.0)
MCHC: 33.6 g/dL (ref 30.0–36.0)
MCV: 88.6 fL (ref 78.0–100.0)
PLATELETS: 256 10*3/uL (ref 150–400)
RBC: 3.59 MIL/uL — ABNORMAL LOW (ref 3.87–5.11)
RDW: 12.2 % (ref 11.5–15.5)
WBC: 7.8 10*3/uL (ref 4.0–10.5)

## 2016-09-30 LAB — BASIC METABOLIC PANEL
Anion gap: 9 (ref 5–15)
BUN: 7 mg/dL (ref 6–20)
CALCIUM: 8.4 mg/dL — AB (ref 8.9–10.3)
CO2: 25 mmol/L (ref 22–32)
CREATININE: 0.73 mg/dL (ref 0.44–1.00)
Chloride: 102 mmol/L (ref 101–111)
GFR calc Af Amer: >60 mL/min (ref >60)
Glucose, Bld: 94 mg/dL (ref 65–99)
POTASSIUM: 3.3 mmol/L — AB (ref 3.5–5.1)
SODIUM: 136 mmol/L (ref 135–145)

## 2016-09-30 LAB — GLUCOSE, CAPILLARY
GLUCOSE-CAPILLARY: 138 mg/dL — AB (ref 65–99)
Glucose-Capillary: 124 mg/dL — ABNORMAL HIGH (ref 65–99)

## 2016-09-30 SURGERY — IRRIGATION AND DEBRIDEMENT EXTREMITY
Anesthesia: Monitor Anesthesia Care | Site: Hand | Laterality: Left

## 2016-09-30 MED ORDER — LACTATED RINGERS IV SOLN
INTRAVENOUS | Status: DC
Start: 2016-09-30 — End: 2016-09-30

## 2016-09-30 MED ORDER — POLYMYXIN B SULFATE 500000 UNITS IJ SOLR
INTRAMUSCULAR | Status: AC
  Filled 2016-09-30: qty 500000

## 2016-09-30 MED ORDER — FENTANYL CITRATE (PF) 100 MCG/2ML IJ SOLN
INTRAMUSCULAR | Status: AC
  Filled 2016-09-30: qty 2

## 2016-09-30 MED ORDER — MEPERIDINE HCL 50 MG/ML IJ SOLN: 6.2500 mg | INTRAMUSCULAR | Status: DC | PRN

## 2016-09-30 MED ORDER — ONDANSETRON HCL 4 MG/2ML IJ SOLN
4.0000 mg | Freq: Once | INTRAMUSCULAR | Status: DC | PRN
Start: 2016-09-30 — End: 2016-09-30

## 2016-09-30 MED ORDER — MAGNESIUM SULFATE 2 GM/50ML IV SOLN
2.0000 g | Freq: Once | INTRAVENOUS | Status: AC
  Administered 2016-09-30: 2 g via INTRAVENOUS
  Filled 2016-09-30: qty 50

## 2016-09-30 MED ORDER — PROPOFOL 10 MG/ML IV BOLUS
INTRAVENOUS | Status: AC
  Filled 2016-09-30: qty 20

## 2016-09-30 MED ORDER — DARUNAVIR-COBICISTAT 800-150 MG PO TABS
1.0000 | ORAL_TABLET | Freq: Every day | ORAL | Status: DC
  Administered 2016-09-30 – 2016-10-04 (×5): 1 via ORAL
  Filled 2016-09-30 (×5): qty 1

## 2016-09-30 MED ORDER — BUPIVACAINE HCL (PF) 0.5 % IJ SOLN
INTRAMUSCULAR | Status: AC
  Filled 2016-09-30: qty 30

## 2016-09-30 MED ORDER — BUPIVACAINE-EPINEPHRINE (PF) 0.25% -1:200000 IJ SOLN
INTRAMUSCULAR | Status: AC
  Filled 2016-09-30: qty 30

## 2016-09-30 MED ORDER — SODIUM CHLORIDE 0.9 % IR SOLN
Status: DC | PRN
  Administered 2016-09-30: 1000 mL

## 2016-09-30 MED ORDER — SODIUM CHLORIDE 0.9 % IR SOLN
Status: AC
  Filled 2016-09-30: qty 500000

## 2016-09-30 MED ORDER — PROPOFOL 500 MG/50ML IV EMUL
INTRAVENOUS | Status: DC | PRN
  Administered 2016-09-30: 25 ug/kg/min via INTRAVENOUS

## 2016-09-30 MED ORDER — MIDAZOLAM HCL 5 MG/5ML IJ SOLN
INTRAMUSCULAR | Status: DC | PRN
  Administered 2016-09-30: 2 mg via INTRAVENOUS

## 2016-09-30 MED ORDER — ONDANSETRON HCL 4 MG/2ML IJ SOLN
INTRAMUSCULAR | Status: DC | PRN
  Administered 2016-09-30: 4 mg via INTRAVENOUS

## 2016-09-30 MED ORDER — HYDROCODONE-ACETAMINOPHEN 5-325 MG PO TABS
1.0000 | ORAL_TABLET | Freq: Four times a day (QID) | ORAL | Status: DC | PRN
  Administered 2016-09-30: 1 via ORAL
  Administered 2016-10-01: 2 via ORAL
  Filled 2016-09-30: qty 1
  Filled 2016-09-30: qty 2

## 2016-09-30 MED ORDER — POTASSIUM CHLORIDE CRYS ER 20 MEQ PO TBCR
40.0000 meq | EXTENDED_RELEASE_TABLET | Freq: Once | ORAL | Status: AC
  Administered 2016-09-30: 40 meq via ORAL
  Filled 2016-09-30: qty 2

## 2016-09-30 MED ORDER — LACTATED RINGERS IV SOLN
INTRAVENOUS | Status: DC | PRN
  Administered 2016-09-30: 21:00:00 via INTRAVENOUS

## 2016-09-30 MED ORDER — MIDAZOLAM HCL 2 MG/2ML IJ SOLN
INTRAMUSCULAR | Status: AC
  Filled 2016-09-30: qty 2

## 2016-09-30 MED ORDER — HYDROMORPHONE HCL 1 MG/ML IJ SOLN: 0.2500 mg | INTRAMUSCULAR | Status: DC | PRN

## 2016-09-30 SURGICAL SUPPLY — 29 items
BANDAGE ACE 4X5 VEL STRL LF (GAUZE/BANDAGES/DRESSINGS) ×3 IMPLANT
BNDG GAUZE ELAST 4 BULKY (GAUZE/BANDAGES/DRESSINGS) ×2 IMPLANT
COVER SURGICAL LIGHT HANDLE (MISCELLANEOUS) ×3 IMPLANT
CUFF TOURN SGL QUICK 18 (TOURNIQUET CUFF) ×3 IMPLANT
DRAIN PENROSE 18X1/4 LTX STRL (WOUND CARE) ×2 IMPLANT
DRSG ADAPTIC 3X8 NADH LF (GAUZE/BANDAGES/DRESSINGS) ×3 IMPLANT
DRSG PAD ABDOMINAL 8X10 ST (GAUZE/BANDAGES/DRESSINGS) IMPLANT
GAUZE SPONGE 4X4 12PLY STRL (GAUZE/BANDAGES/DRESSINGS) ×2 IMPLANT
GLOVE BIO SURGEON STRL SZ8 (GLOVE) ×3 IMPLANT
GOWN STRL REUS W/TWL XL LVL3 (GOWN DISPOSABLE) ×3 IMPLANT
KIT BASIN OR (CUSTOM PROCEDURE TRAY) ×3 IMPLANT
LOOP VESSEL MAXI BLUE (MISCELLANEOUS) ×1 IMPLANT
MANIFOLD NEPTUNE II (INSTRUMENTS) ×3 IMPLANT
PACK ORTHO EXTREMITY (CUSTOM PROCEDURE TRAY) ×3 IMPLANT
PAD CAST 4YDX4 CTTN HI CHSV (CAST SUPPLIES) ×1 IMPLANT
PADDING CAST COTTON 4X4 STRL (CAST SUPPLIES) ×3
SET IRRIG Y TYPE TUR BLADDER L (SET/KITS/TRAYS/PACK) ×2 IMPLANT
SPLINT FIBERGLASS 4X15 (CAST SUPPLIES) ×3 IMPLANT
SUT PROLENE 3 0 PS 2 (SUTURE) IMPLANT
SUT VIC AB 1 CT1 27 (SUTURE)
SUT VIC AB 1 CT1 27XBRD ANTBC (SUTURE) IMPLANT
SUT VIC AB 2-0 CT1 27 (SUTURE) ×3
SUT VIC AB 2-0 CT1 27XBRD (SUTURE) IMPLANT
SWAB COLLECTION DEVICE MRSA (MISCELLANEOUS) ×3 IMPLANT
SWAB CULTURE ESWAB REG 1ML (MISCELLANEOUS) ×3 IMPLANT
SYR 20CC LL (SYRINGE) ×1 IMPLANT
SYR CONTROL 10ML LL (SYRINGE) IMPLANT
TOWEL OR 17X26 10 PK STRL BLUE (TOWEL DISPOSABLE) ×3 IMPLANT
TRAY PREP A LATEX SAFE STRL (SET/KITS/TRAYS/PACK) ×3 IMPLANT

## 2016-09-30 NOTE — Anesthesia Postprocedure Evaluation (Signed)
Anesthesia Post Note  Patient: Courtney Ellis  Procedure(s) Performed: Procedure(s) (LRB): IRRIGATION AND DEBRIDEMENT LEFT WRIST AND HAND (Left)     Patient location during evaluation: PACU Anesthesia Type: Regional and MAC Level of consciousness: awake and alert and patient cooperative Pain management: pain level controlled Vital Signs Assessment: post-procedure vital signs reviewed and stable Respiratory status: spontaneous breathing and respiratory function stable Cardiovascular status: stable Anesthetic complications: no    Last Vitals:  Vitals:   09/30/16 1159 09/30/16 1440  BP: 126/74 100/67  Pulse:  79  Resp:  18  Temp:  37.7 C    Last Pain:  Vitals:   09/30/16 1957  TempSrc:   PainSc: 9                  Abbigaile Rockman DAVID

## 2016-09-30 NOTE — Anesthesia Preprocedure Evaluation (Signed)
Anesthesia Evaluation  Patient identified by MRN, date of birth, ID band Patient awake    Reviewed: Allergy & Precautions, NPO status , Patient's Chart, lab work & pertinent test results  Airway Mallampati: I  TM Distance: >3 FB Neck ROM: Full    Dental   Pulmonary Current Smoker,    Pulmonary exam normal        Cardiovascular hypertension, Pt. on medications Normal cardiovascular exam     Neuro/Psych Anxiety Depression    GI/Hepatic (+) Hepatitis -, C  Endo/Other    Renal/GU      Musculoskeletal   Abdominal   Peds  Hematology  (+) HIV,   Anesthesia Other Findings   Reproductive/Obstetrics                             Anesthesia Physical Anesthesia Plan  ASA: III  Anesthesia Plan: Regional and MAC   Post-op Pain Management:    Induction: Intravenous  PONV Risk Score and Plan: 1 and Ondansetron  Airway Management Planned: Simple Face Mask  Additional Equipment:   Intra-op Plan:   Post-operative Plan:   Informed Consent: I have reviewed the patients History and Physical, chart, labs and discussed the procedure including the risks, benefits and alternatives for the proposed anesthesia with the patient or authorized representative who has indicated his/her understanding and acceptance.     Plan Discussed with: CRNA and Surgeon  Anesthesia Plan Comments:         Anesthesia Quick Evaluation

## 2016-09-30 NOTE — Op Note (Signed)
see dictation [id number]  Status post irrigation debridement with fasciotomy and tenosynovectomy of extensor apparatus. 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  Drain was placed.  I would recommend WaterPik to begin Thursday followed by wet-to-dry dressing changes.  I do not plan of additional surgical debridement as I do feel the patient will respond.  Flem Enderle M.D.

## 2016-09-30 NOTE — Progress Notes (Signed)
Spoke with Dr. Amanda PeaGramig, make pt NPO, plan for I&D tonight. Thank you Dr. Amanda PeaGramig for assistance.   Courtney Ellis, ISKRA, MD  [facility] Pager [phone]  If 7PM-7AM, please contact night-coverage [email or web address] Password [email or web address]

## 2016-09-30 NOTE — Anesthesia Procedure Notes (Signed)
Anesthesia Regional Block: Supraclavicular block   Pre-Anesthetic Checklist: ,, timeout performed, Correct Patient, Correct Site, Correct Laterality, Correct Procedure, Correct Position, site marked, Risks and benefits discussed,  Surgical consent,  Pre-op evaluation,  At surgeon's request and post-op pain management  Laterality: Left  Prep: chloraprep       Needles:  Injection technique: Single-shot  Needle Type: Echogenic Stimulator Needle     Needle Length: 9cm  Needle Gauge: 21     Additional Needles:   Procedures: ultrasound guided, nerve stimulator,,,,,,   Nerve Stimulator or Paresthesia:  Response: 0.4 mA,   Additional Responses:   Narrative:  Start time: 09/30/2016 8:45 PM End time: 09/30/2016 8:55 PM Injection made incrementally with aspirations every 5 mL.  Performed by: Personally  Anesthesiologist: Arta BruceSSEY, Reiley Bertagnolli  Additional Notes: Monitors applied. Patient sedated. Sterile prep and drape,hand hygiene and sterile gloves were used. Relevant anatomy identified.Needle position confirmed.Local anesthetic injected incrementally after negative aspiration. Local anesthetic spread visualized around nerve(s). Vascular puncture avoided. No complications. Image printed for medical record.The patient tolerated the procedure well.

## 2016-09-30 NOTE — Transfer of Care (Signed)
Immediate Anesthesia Transfer of Care Note  Patient: Courtney Ellis  Procedure(s) Performed: Procedure(s): IRRIGATION AND DEBRIDEMENT LEFT WRIST AND HAND (Left)  Patient Location: PACU  Anesthesia Type:Regional  Level of Consciousness:  sedated, patient cooperative and responds to stimulation  Airway & Oxygen Therapy:Patient Spontanous Breathing and Patient connected to face mask oxgen  Post-op Assessment:  Report given to PACU RN and Post -op Vital signs reviewed and stable  Post vital signs:  Reviewed and stable  Last Vitals:  Vitals:   09/30/16 1159 09/30/16 1440  BP: 126/74 100/67  Pulse:  79  Resp:  18  Temp:  37.7 C    Complications: No apparent anesthesia complications

## 2016-09-30 NOTE — Progress Notes (Addendum)
Patient ID: Courtney Ellis, female   DOB: 09/09/1960, 56 y.o.   MRN: [id number]    PROGRESS NOTE  Courtney Ellis  MWN:[id number] DOB: [birth year] DOA: 09/29/2016  PCP: Adah Salvage, NP   Brief Narrative:  56 yo female with HIV on ART, hep C, recently hospitalized at [facility] for sepsis due to rhinovirus and left arm sellulitis but has left AMA, now came back with worsening left arm cellulitis.   Assessment & Plan:   Principal Problem:   Sepsis due to Left arm swelling and cellulitis / Leukocytosis - MRI hand and wrist with diffuse and severe cellulitis involving the hand and wrist, soft tissue abscess in the webspace between the thumb and second metacarpal and also in the subcutaneous tissues more proximally. Extensive myositis involving the thenar musculature without definite pyomyositis. Acute versus chronic osteomyelitis involving the first metacarpal. - keep on Clindamycin day #2 - ortho consulted to review images and provide recommendations - follow up on cultures    Active Problems:   Human immunodeficiency virus (HIV) disease (HCC) - keep on home medical regimen     Chronic hepatitis C without hepatic coma (HCC) - per ID    Hypomagnesemia and hypokalemia - supplement and repeat BMP in AM, Mg in AM    Essential hypertension - continue Norvasc   DVT prophylaxis: Lovenox SQ Code Status: Full  Family Communication: Patient at bedside  Disposition Plan: To be determined   Consultants:   Hand ortho   Procedures:   None  Antimicrobials:   Clindamycin 6/11 -->  Subjective: Left hand still swollen.   Objective: Vitals:   09/30/16 0317 09/30/16 0531 09/30/16 1019 09/30/16 1159  BP:  131/79 97/60 126/74  Pulse:  71    Resp:  15    Temp:  99.5 F (37.5 C)    TempSrc:  Oral    SpO2:  100%    Weight: 66.7 kg (147 lb)     Height:        Intake/Output Summary (Last 24 hours) at 09/30/16 1511 Last data filed at 09/30/16 1433  Gross per 24 hour  Intake            2237.5 ml  Output                0 ml  Net           2237.5 ml   Filed Weights   09/29/16 1033 09/30/16 0317  Weight: 65.8 kg (145 lb) 66.7 kg (147 lb)    Examination:  General exam: Appears calm and comfortable  Respiratory system: Clear to auscultation. Respiratory effort normal. Cardiovascular system: S1 & S2 heard, RRR. No JVD, murmurs, rubs, gallops or clicks. No pedal edema. Gastrointestinal system: Abdomen is nondistended, soft and nontender. No organomegaly or masses felt. Normal bowel sounds heard. Central nervous system: Alert and oriented. No focal neurological deficits. Extremities: left hans still swollen and warm to touch, tender to palpation, extending almost to the elbow  Psychiatry: Judgement and insight appear normal. Mood & affect appropriate.    Data Reviewed: I have personally reviewed following labs and imaging studies  CBC:  Recent Labs Lab 09/27/16 0135 09/28/16 0539 09/30/16 0430  WBC 11.9* 8.8 7.8  NEUTROABS 6.4  --   --   HGB 12.1 11.3* 10.7*  HCT 35.7* 34.4* 31.8*  MCV 89.9 89.1 88.6  PLT 300 288 256   Basic Metabolic Panel:  Recent Labs Lab 09/27/16 0135 09/28/16 0539 09/29/16 1155 09/30/16 0430  NA 132* 138 132* 136  K 3.8 3.8 3.9 3.3*  CL 99* 106 99* 102  CO2 23 25 25 25   GLUCOSE 164* 118* 101* 94  BUN 5* <5* 7 7  CREATININE 0.94 0.69 0.74 0.73  CALCIUM 9.2 8.7* 8.6* 8.4*  MG  --   --  1.4*  --   PHOS  --   --  3.1  --    Liver Function Tests:  Recent Labs Lab 09/27/16 0135 09/29/16 1155  AST 38 35  ALT 25 20  ALKPHOS 79 72  BILITOT 0.4 1.2  PROT 7.9 7.3  ALBUMIN 3.3* 3.0*   CBG:  Recent Labs Lab 09/30/16 0819  GLUCAP 138*   Urine analysis:    Component Value Date/Time   COLORURINE AMBER (A) 09/27/2016 0140   APPEARANCEUR HAZY (A) 09/27/2016 0140   LABSPEC 1.026 09/27/2016 0140   PHURINE 5.0 09/27/2016 0140   GLUCOSEU NEGATIVE 09/27/2016 0140   GLUCOSEU NEG mg/dL 96/29/5284 1324   HGBUR SMALL (A)  09/27/2016 0140   BILIRUBINUR SMALL (A) 09/27/2016 0140   KETONESUR NEGATIVE 09/27/2016 0140   PROTEINUR >=300 (A) 09/27/2016 0140   UROBILINOGEN 0.2 02/22/2014 1200   NITRITE NEGATIVE 09/27/2016 0140   LEUKOCYTESUR NEGATIVE 09/27/2016 0140   Recent Results (from the past 240 hour(s))  Culture, blood (Routine x 2)     Status: None (Preliminary result)   Collection Time: 09/27/16  1:20 AM  Result Value Ref Range Status   Specimen Description BLOOD RIGHT ARM  Final   Special Requests IN PEDIATRIC BOTTLE Blood Culture adequate volume  Final   Culture NO GROWTH 3 DAYS  Final   Report Status PENDING  Incomplete  Culture, blood (Routine x 2)     Status: None (Preliminary result)   Collection Time: 09/27/16  1:39 AM  Result Value Ref Range Status   Specimen Description BLOOD RIGHT HAND  Final   Special Requests   Final    BOTTLES DRAWN AEROBIC AND ANAEROBIC Blood Culture adequate volume   Culture NO GROWTH 3 DAYS  Final   Report Status PENDING  Incomplete  Urine culture     Status: Abnormal   Collection Time: 09/27/16  2:35 AM  Result Value Ref Range Status   Specimen Description URINE, RANDOM  Final   Special Requests NONE  Final   Culture (A)  Final    50,000 COLONIES/mL LACTOBACILLUS SPECIES Standardized susceptibility testing for this organism is not available.    Report Status 09/28/2016 FINAL  Final  Respiratory Panel by PCR     Status: Abnormal   Collection Time: 09/27/16  6:03 AM  Result Value Ref Range Status   Adenovirus NOT DETECTED NOT DETECTED Final   Coronavirus 229E NOT DETECTED NOT DETECTED Final   Coronavirus HKU1 NOT DETECTED NOT DETECTED Final   Coronavirus NL63 NOT DETECTED NOT DETECTED Final   Coronavirus OC43 NOT DETECTED NOT DETECTED Final   Metapneumovirus NOT DETECTED NOT DETECTED Final   Rhinovirus / Enterovirus DETECTED (A) NOT DETECTED Final   Influenza A NOT DETECTED NOT DETECTED Final   Influenza B NOT DETECTED NOT DETECTED Final   Parainfluenza  Virus 1 NOT DETECTED NOT DETECTED Final   Parainfluenza Virus 2 NOT DETECTED NOT DETECTED Final   Parainfluenza Virus 3 NOT DETECTED NOT DETECTED Final   Parainfluenza Virus 4 NOT DETECTED NOT DETECTED Final   Respiratory Syncytial Virus NOT DETECTED NOT DETECTED Final   Bordetella pertussis NOT DETECTED NOT DETECTED Final   Chlamydophila pneumoniae  NOT DETECTED NOT DETECTED Final   Mycoplasma pneumoniae NOT DETECTED NOT DETECTED Final  MRSA PCR Screening     Status: None   Collection Time: 09/27/16  6:03 AM  Result Value Ref Range Status   MRSA by PCR NEGATIVE NEGATIVE Final    Comment:        The GeneXpert MRSA Assay (FDA approved for NASAL specimens only), is one component of a comprehensive MRSA colonization surveillance program. It is not intended to diagnose MRSA infection nor to guide or monitor treatment for MRSA infections.   Culture, blood (routine x 2)     Status: None (Preliminary result)   Collection Time: 09/29/16 11:38 AM  Result Value Ref Range Status   Specimen Description BLOOD LEFT ARM  Final   Special Requests   Final    BOTTLES DRAWN AEROBIC ONLY Blood Culture adequate volume   Culture   Final    NO GROWTH 1 DAY Performed at [facility], [address] [place], [place] [address]    Report Status PENDING  Incomplete  Culture, blood (routine x 2)     Status: None (Preliminary result)   Collection Time: 09/29/16 11:38 AM  Result Value Ref Range Status   Specimen Description BLOOD RIGHT HAND  Final   Special Requests IN PEDIATRIC BOTTLE Blood Culture adequate volume  Final   Culture   Final    NO GROWTH 1 DAY Performed at [facility], [address], [place], [place] [address]    Report Status PENDING  Incomplete      Radiology Studies: Mr Hand Left W Wo Contrast  Result Date: 09/29/2016 CLINICAL DATA:  Diffuse soft tissue swelling/ edema and erythema involving the hand and wrist. EXAM: MRI OF THE LEFT HAND WITHOUT AND WITH CONTRAST;  MRI OF THE LEFT WRIST WITHOUT AND WITH CONTRAST TECHNIQUE: Multiplanar, multisequence MR imaging of the left hand and wrist was performed before and after the administration of intravenous contrast. CONTRAST:  14mL MULTIHANCE GADOBENATE DIMEGLUMINE 529 MG/ML IV SOLN COMPARISON:  None. FINDINGS: There is severe diffuse subcutaneous soft tissue swelling/ edema/fluid most notably involving the radial aspect of hand and wrist. Findings consistent with severe cellulitis. There is also the soft tissue abscess in the webspace between the thumb and second metacarpal. This measures 13 x 11 mm and is surrounded by a thick rim enhancing soft tissue/inflammatory phlegmon. Slightly more proximally there is a cluster of microabscesses located more superficially. There is also significant myositis involving the thenar musculature. No definite findings for pyomyositis. Signal abnormality in the first metacarpal is worrisome for osteomyelitis. No definite MR findings for [facility] septic arthritis. IMPRESSION: Diffuse and severe cellulitis involving the hand and wrist. Soft tissue abscess in the webspace between the thumb and second metacarpal and also in the subcutaneous tissues more proximally. Extensive myositis involving the thenar musculature without definite pyomyositis. Acute versus chronic osteomyelitis involving the first metacarpal. These results will be called to the ordering clinician or representative by the Radiologist Assistant, and communication documented in the PACS or zVision Dashboard. Electronically Signed   By: Rudie Meyer M.D.   On: 09/29/2016 17:10   Mr Wrist Left W Wo Contrast  Result Date: 09/29/2016 CLINICAL DATA:  Diffuse soft tissue swelling/ edema and erythema involving the hand and wrist. EXAM: MRI OF THE LEFT HAND WITHOUT AND WITH CONTRAST; MRI OF THE LEFT WRIST WITHOUT AND WITH CONTRAST TECHNIQUE: Multiplanar, multisequence MR imaging of the left hand and wrist was performed before and  after the  administration of intravenous contrast. CONTRAST:  14mL MULTIHANCE GADOBENATE DIMEGLUMINE 529 MG/ML IV SOLN COMPARISON:  None. FINDINGS: There is severe diffuse subcutaneous soft tissue swelling/ edema/fluid most notably involving the radial aspect of hand and wrist. Findings consistent with severe cellulitis. There is also the soft tissue abscess in the webspace between the thumb and second metacarpal. This measures 13 x 11 mm and is surrounded by a thick rim enhancing soft tissue/inflammatory phlegmon. Slightly more proximally there is a cluster of microabscesses located more superficially. There is also significant myositis involving the thenar musculature. No definite findings for pyomyositis. Signal abnormality in the first metacarpal is worrisome for osteomyelitis. No definite MR findings for [facility] septic arthritis. IMPRESSION: Diffuse and severe cellulitis involving the hand and wrist. Soft tissue abscess in the webspace between the thumb and second metacarpal and also in the subcutaneous tissues more proximally. Extensive myositis involving the thenar musculature without definite pyomyositis. Acute versus chronic osteomyelitis involving the first metacarpal. These results will be called to the ordering clinician or representative by the Radiologist Assistant, and communication documented in the PACS or zVision Dashboard. Electronically Signed   By: Rudie MeyerP.  Gallerani M.D.   On: 09/29/2016 17:10      Scheduled Meds: . abacavir-dolutegravir-lamiVUDine  1 tablet Oral Daily  . amLODipine  5 mg Oral Daily  . darunavir-cobicistat  1 tablet Oral Q breakfast  . loratadine  10 mg Oral Daily  . traZODone  100 mg Oral QHS   Continuous Infusions: . sodium chloride 1,000 mL (09/30/16 1511)  . clindamycin (CLEOCIN) IV Stopped (09/30/16 1425)     LOS: 1 day   Time spent: 35 minutes   Debbora PrestoIskra Magick-Blessed Girdner, MD [facility] Pager [phone]  If 7PM-7AM, please contact  night-coverage [email or web address] Password [email or web address] 09/30/2016, 3:11 PM

## 2016-09-30 NOTE — Consult Note (Signed)
Reason for Consult: Infection left hand Referring Physician: Hospital service  Courtney Ellis is an 56 y.o. female.  HPI: 56 year old female with an abscess in her left hand. This is confirmed by MRI.  She has a challenging history of drug abuse, HIV, hepatitis, depression and other issues surrounding her. At present juncture she injected the left first webspace approximately 5-6 days ago. This is cumulative then tremendous swelling pain and deformity about the hand.  MRI confirms an abscess.  I was called today to evaluate. I've evaluated her and do see an obvious abscess on the MRI scan which needs draining.  I do not feel that simple abiotic measures are going to suffice given the loculated abscess present.  Past Medical History:  Diagnosis Date  . Anxiety   . Depression   . Hepatitis    C  . Heroin abuse   . HIV infection ([place])   . Hypertension     Past Surgical History:  Procedure Laterality Date  . CESAREAN SECTION    . DILATION AND CURETTAGE OF UTERUS N/A 06/27/2016   Procedure: Removal Albertina Parr Body Vagina;  Surgeon: Janyth Pupa, DO;  Location: [facility];  Service: Gynecology;  Laterality: N/A;    Family History  Problem Relation Age of Onset  . Hypertension Mother   . Alcohol abuse Mother   . Depression Mother   . Diabetes Mother   . Drug abuse Mother   . Alcohol abuse Father   . Cancer Father   . Depression Father   . Drug abuse Father   . Depression Sister   . Diabetes Sister   . Drug abuse Sister   . Mental illness Sister   . Depression Brother   . Drug abuse Brother   . Mental illness Brother   . Depression Maternal Aunt   . Drug abuse Maternal Aunt   . Depression Maternal Uncle   . Drug abuse Maternal Uncle   . Depression Paternal Aunt   . Diabetes Paternal Aunt   . Drug abuse Paternal Aunt   . Stroke Paternal Aunt   . Alcohol abuse Paternal Uncle   . Depression Paternal Uncle   . Diabetes Paternal Uncle   . Drug abuse Paternal Uncle   . Kidney  disease Paternal Uncle     Social History:  reports that she has been smoking Cigarettes.  She has been smoking about 0.20 packs per day. She has never used smokeless tobacco. She reports that she uses drugs, including IV. She reports that she does not drink alcohol.  Allergies: No Known Allergies  Medications: I have reviewed the patient's current medications.  Results for orders placed or performed during the hospital encounter of 09/29/16 (from the past 48 hour(s))  Urine rapid drug screen (hosp performed)     Status: Abnormal   Collection Time: 09/29/16 11:26 AM  Result Value Ref Range   Opiates POSITIVE (A) NONE DETECTED   Cocaine NONE DETECTED NONE DETECTED   Benzodiazepines POSITIVE (A) NONE DETECTED   Amphetamines NONE DETECTED NONE DETECTED   Tetrahydrocannabinol NONE DETECTED NONE DETECTED   Barbiturates NONE DETECTED NONE DETECTED    Comment:        DRUG SCREEN FOR MEDICAL PURPOSES ONLY.  IF CONFIRMATION IS NEEDED FOR ANY PURPOSE, NOTIFY LAB WITHIN 5 DAYS.        LOWEST DETECTABLE LIMITS FOR URINE DRUG SCREEN Drug Class       Cutoff (ng/mL) Amphetamine      1000 Barbiturate  200 Benzodiazepine   680 Tricyclics       321 Opiates          300 Cocaine          300 THC              50   Culture, blood (routine x 2)     Status: None (Preliminary result)   Collection Time: 09/29/16 11:38 AM  Result Value Ref Range   Specimen Description BLOOD LEFT ARM    Special Requests      BOTTLES DRAWN AEROBIC ONLY Blood Culture adequate volume   Culture      NO GROWTH 1 DAY Performed at [facility], [place] [address] [place], [place] [address]    Report Status PENDING   Culture, blood (routine x 2)     Status: None (Preliminary result)   Collection Time: 09/29/16 11:38 AM  Result Value Ref Range   Specimen Description BLOOD RIGHT HAND    Special Requests IN PEDIATRIC BOTTLE Blood Culture adequate volume    Culture      NO GROWTH 1 DAY Performed at [facility], [place] [address] [place], [place] [address]    Report Status PENDING   Magnesium     Status: Abnormal   Collection Time: 09/29/16 11:55 AM  Result Value Ref Range   Magnesium 1.4 (L) 1.7 - 2.4 mg/dL  Phosphorus     Status: None   Collection Time: 09/29/16 11:55 AM  Result Value Ref Range   Phosphorus 3.1 2.5 - 4.6 mg/dL  Comprehensive metabolic panel     Status: Abnormal   Collection Time: 09/29/16 11:55 AM  Result Value Ref Range   Sodium 132 (L) 135 - 145 mmol/L   Potassium 3.9 3.5 - 5.1 mmol/L   Chloride 99 (L) 101 - 111 mmol/L   CO2 25 22 - 32 mmol/L   Glucose, Bld 101 (H) 65 - 99 mg/dL   BUN 7 6 - 20 mg/dL   Creatinine, Ser 0.74 0.44 - 1.00 mg/dL   Calcium 8.6 (L) 8.9 - 10.3 mg/dL   Total Protein 7.3 6.5 - 8.1 g/dL   Albumin 3.0 (L) 3.5 - 5.0 g/dL   AST 35 15 - 41 U/L   ALT 20 14 - 54 U/L   Alkaline Phosphatase 72 38 - 126 U/L   Total Bilirubin 1.2 0.3 - 1.2 mg/dL   GFR calc non Af Amer >60 >60 mL/min   GFR calc Af Amer >60 >60 mL/min    Comment: (NOTE) The eGFR has been calculated using the CKD EPI equation. This calculation has not been validated in all clinical situations. eGFR's persistently <60 mL/min signify possible Chronic Kidney Disease.    Anion gap 8 5 - 15  Ethanol     Status: None   Collection Time: 09/29/16 11:55 AM  Result Value Ref Range   Alcohol, Ethyl (B) <5 <5 mg/dL    Comment:        LOWEST DETECTABLE LIMIT FOR SERUM ALCOHOL IS 5 mg/dL FOR MEDICAL PURPOSES ONLY   Basic metabolic panel     Status: Abnormal   Collection Time: 09/30/16  4:30 AM  Result Value Ref Range   Sodium 136 135 - 145 mmol/L   Potassium 3.3 (L) 3.5 - 5.1 mmol/L   Chloride 102 101 - 111 mmol/L   CO2 25 22 - 32 mmol/L   Glucose, Bld 94 65 - 99 mg/dL   BUN 7 6 -  20 mg/dL   Creatinine, Ser 0.73 0.44 - 1.00 mg/dL   Calcium 8.4 (L) 8.9 - 10.3 mg/dL   GFR calc non Af Amer >60 >60 mL/min   GFR calc Af Amer >60 >60 mL/min    Comment: (NOTE) The eGFR has been  calculated using the CKD EPI equation. This calculation has not been validated in all clinical situations. eGFR's persistently <60 mL/min signify possible Chronic Kidney Disease.    Anion gap 9 5 - 15  CBC     Status: Abnormal   Collection Time: 09/30/16  4:30 AM  Result Value Ref Range   WBC 7.8 4.0 - 10.5 K/uL   RBC 3.59 (L) 3.87 - 5.11 MIL/uL   Hemoglobin 10.7 (L) 12.0 - 15.0 g/dL   HCT 31.8 (L) 36.0 - 46.0 %   MCV 88.6 78.0 - 100.0 fL   MCH 29.8 26.0 - 34.0 pg   MCHC 33.6 30.0 - 36.0 g/dL   RDW 12.2 11.5 - 15.5 %   Platelets 256 150 - 400 K/uL  Glucose, capillary     Status: Abnormal   Collection Time: 09/30/16  8:19 AM  Result Value Ref Range   Glucose-Capillary 138 (H) 65 - 99 mg/dL    Mr Hand Left W Wo Contrast  Result Date: 09/29/2016 CLINICAL DATA:  Diffuse soft tissue swelling/ edema and erythema involving the hand and wrist. EXAM: MRI OF THE LEFT HAND WITHOUT AND WITH CONTRAST; MRI OF THE LEFT WRIST WITHOUT AND WITH CONTRAST TECHNIQUE: Multiplanar, multisequence MR imaging of the left hand and wrist was performed before and after the administration of intravenous contrast. CONTRAST:  3m MULTIHANCE GADOBENATE DIMEGLUMINE 529 MG/ML IV SOLN COMPARISON:  None. FINDINGS: There is severe diffuse subcutaneous soft tissue swelling/ edema/fluid most notably involving the radial aspect of hand and wrist. Findings consistent with severe cellulitis. There is also the soft tissue abscess in the webspace between the thumb and second metacarpal. This measures 13 x 11 mm and is surrounded by a thick rim enhancing soft tissue/inflammatory phlegmon. Slightly more proximally there is a cluster of microabscesses located more superficially. There is also significant myositis involving the thenar musculature. No definite findings for pyomyositis. Signal abnormality in the first metacarpal is worrisome for osteomyelitis. No definite [facility]. IMPRESSION: Diffuse and severe  cellulitis involving the hand and wrist. Soft tissue abscess in the webspace between the thumb and second metacarpal and also in the subcutaneous tissues more proximally. Extensive myositis involving the thenar musculature without definite pyomyositis. Acute versus chronic osteomyelitis involving the first metacarpal. These results will be called to the ordering clinician or representative by the Radiologist Assistant, and communication documented in the PACS or zVision Dashboard. Electronically Signed   By: PMarijo SanesM.D.   On: 09/29/2016 17:10   Mr Wrist Left W Wo Contrast  Result Date: 09/29/2016 CLINICAL DATA:  Diffuse soft tissue swelling/ edema and erythema involving the hand and wrist. EXAM: MRI OF THE LEFT HAND WITHOUT AND WITH CONTRAST; MRI OF THE LEFT WRIST WITHOUT AND WITH CONTRAST TECHNIQUE: Multiplanar, multisequence MR imaging of the left hand and wrist was performed before and after the administration of intravenous contrast. CONTRAST:  159mMULTIHANCE GADOBENATE DIMEGLUMINE 529 MG/ML IV SOLN COMPARISON:  None. FINDINGS: There is severe diffuse subcutaneous soft tissue swelling/ edema/fluid most notably involving the radial aspect of hand and wrist. Findings consistent with severe cellulitis. There is also the soft tissue abscess in the webspace between the thumb and second metacarpal.  This measures 13 x 11 mm and is surrounded by a thick rim enhancing soft tissue/inflammatory phlegmon. Slightly more proximally there is a cluster of microabscesses located more superficially. There is also significant myositis involving the thenar musculature. No definite findings for pyomyositis. Signal abnormality in the first metacarpal is worrisome for osteomyelitis. No definite MR findings for [facility] septic arthritis. IMPRESSION: Diffuse and severe cellulitis involving the hand and wrist. Soft tissue abscess in the webspace between the thumb and second metacarpal and also in the subcutaneous tissues more  proximally. Extensive myositis involving the thenar musculature without definite pyomyositis. Acute versus chronic osteomyelitis involving the first metacarpal. These results will be called to the ordering clinician or representative by the Radiologist Assistant, and communication documented in the PACS or zVision Dashboard. Electronically Signed   By: Marijo Sanes M.D.   On: 09/29/2016 17:10    Review of Systems  Respiratory: Negative.   Cardiovascular: Negative.   Gastrointestinal: Negative.   Genitourinary: Negative.    Blood pressure 100/67, pulse 79, temperature 99.9 F (37.7 C), temperature source Oral, resp. rate 18, height '5\' 3"'$  (1.6 m), weight 66.7 kg (147 lb), last menstrual period 09/08/2011, SpO2 97 %. Physical Exam Left hand has tremendous swelling pain and obvious abscess where an IV injection occurred attempting to introduce hair 156 days ago. She is difficulty moving the fingers tremendous pain and tremendous swelling. This appears to be a thenar space abscess is correlated on MRI which have extensively reviewed.  Right upper extremity is neurovascularly intact with intact IV access. Lower stem examination is fairly benign abdomen is nontender nondistended chest is clear today  I reviewed this with patient at length and the findings.   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  We'll plan for I and the Ale[place] the wounds open will have daily WaterPik and wound care for the patient. I discussed her the nature and challenges of deep space infections. We'll do our best to eradicate the infection. I've given her a long discussion in regards to risk and benefits and other issues surrounding her care plan.  We are planning surgery for your upper extremity. The risk and benefits of surgery to include risk of bleeding, infection, anesthesia,  damage to normal structures and failure of the surgery to accomplish its intended goals of relieving symptoms and restoring  function have been discussed in detail. With this in mind we plan to proceed. I have specifically discussed with the patient the pre-and postoperative regime and the dos and don'ts and risk and benefits in great detail. Risk and benefits of surgery also include risk of dystrophy(CRPS), chronic nerve pain, failure of the healing process to go onto completion and other inherent risks of surgery The relavent the pathophysiology of the disease/injury process, as well as the alternatives for treatment and postoperative course of action has been discussed in great detail with the patient who desires to proceed.  We will do everything in our power to help you (the patient) restore function to the upper extremity. It is a pleasure to see this patient today.   Paulene Floor 09/30/2016, 8:25 PM

## 2016-10-01 ENCOUNTER — Encounter (HOSPITAL_COMMUNITY): Payer: Self-pay | Admitting: Orthopedic Surgery

## 2016-10-01 LAB — CBC
HCT: 31.4 % — ABNORMAL LOW (ref 36.0–46.0)
Hemoglobin: 10.6 g/dL — ABNORMAL LOW (ref 12.0–15.0)
MCH: 29.4 pg (ref 26.0–34.0)
MCHC: 33.8 g/dL (ref 30.0–36.0)
MCV: 87.2 fL (ref 78.0–100.0)
PLATELETS: 259 10*3/uL (ref 150–400)
RBC: 3.6 MIL/uL — AB (ref 3.87–5.11)
RDW: 12.2 % (ref 11.5–15.5)
WBC: 7 10*3/uL (ref 4.0–10.5)

## 2016-10-01 LAB — MAGNESIUM: Magnesium: 1.6 mg/dL — ABNORMAL LOW (ref 1.7–2.4)

## 2016-10-01 LAB — BASIC METABOLIC PANEL
Anion gap: 6 (ref 5–15)
BUN: 9 mg/dL (ref 6–20)
CO2: 24 mmol/L (ref 22–32)
CREATININE: 0.74 mg/dL (ref 0.44–1.00)
Calcium: 8.7 mg/dL — ABNORMAL LOW (ref 8.9–10.3)
Chloride: 107 mmol/L (ref 101–111)
GFR calc Af Amer: >60 mL/min (ref >60)
Glucose, Bld: 117 mg/dL — ABNORMAL HIGH (ref 65–99)
POTASSIUM: 4.2 mmol/L (ref 3.5–5.1)
Sodium: 137 mmol/L (ref 135–145)

## 2016-10-01 LAB — GLUCOSE, CAPILLARY
Glucose-Capillary: 355 mg/dL — ABNORMAL HIGH (ref 65–99)
Glucose-Capillary: 122 mg/dL — ABNORMAL HIGH (ref 65–99)

## 2016-10-01 MED ORDER — MAGNESIUM SULFATE 2 GM/50ML IV SOLN
2.0000 g | Freq: Once | INTRAVENOUS | Status: AC
  Administered 2016-10-01: 2 g via INTRAVENOUS
  Filled 2016-10-01: qty 50

## 2016-10-01 MED ORDER — OXYCODONE HCL 5 MG PO TABS
5.0000 mg | ORAL_TABLET | Freq: Four times a day (QID) | ORAL | Status: DC | PRN
  Administered 2016-10-01: 5 mg via ORAL
  Administered 2016-10-01 – 2016-10-04 (×10): 10 mg via ORAL
  Filled 2016-10-01 (×11): qty 2

## 2016-10-01 MED ORDER — ENOXAPARIN SODIUM 40 MG/0.4ML ~~LOC~~ SOLN
40.0000 mg | SUBCUTANEOUS | Status: DC
  Administered 2016-10-01 – 2016-10-03 (×2): 40 mg via SUBCUTANEOUS
  Filled 2016-10-01 (×2): qty 0.4

## 2016-10-01 NOTE — Progress Notes (Signed)
PROGRESS NOTE    Courtney HINDE   ZOX:[id number]  DOB: March 22, 1961  DOA: 09/29/2016 PCP: Adah Salvage, NP   Brief Narrative:  56 yo female with HIV on ART, hep C, heroin use recently hospitalized at [facility] for sepsis due to rhinovirus and left arm cellulitis but left AMA on 6/10. She returns for increased swelling of the hand and arm , fevers and chills.   Subjective: C/o pain in left arm and hand uncontrolled with Hydrocodone but better controlled with Oxycodone.   Assessment & Plan:   Principal Problem:   Left hand/ arm cellulitis and abscess at site of Heroin injection  Sepsis - MRI suggestive of cellulitis, abscess, myositis and possibly osteomyelitis -see report belwo - s/p I and D by Dr Amanda Pea on 6/12 - await cultures and cont Clindamycin  Active Problems:  HIV - CD 4 count 350 on 2/18- cont Triumeq and  Repeat CD 4 count    Chronic hepatitis C without hepatic coma  - outpt f/u    Essential hypertension - Norvasc    Substance abuse with Heroin   - needs counseling   DVT prophylaxis: Lovenox  Code Status: Full code Family Communication: son at bedside Disposition Plan: home when stable Consultants:   Dr Amanda Pea- hand surgery Procedures:   I and D, fasciotomy, tenosynovectomy  Antimicrobials:  Anti-infectives    Start     Dose/Rate Route Frequency Ordered Stop   09/30/16 1200  darunavir-cobicistat (PREZCOBIX) 800-150 MG per tablet 1 tablet     1 tablet Oral Daily with breakfast 09/30/16 0940     09/29/16 1700  darunavir-cobicistat (PREZCOBIX) 800-150 MG per tablet 1 tablet  Status:  Discontinued     1 tablet Oral Daily 09/29/16 1122 09/30/16 0940   09/29/16 1500  abacavir-dolutegravir-lamiVUDine (TRIUMEQ) 600-50-300 MG per tablet 1 tablet     1 tablet Oral Daily 09/29/16 1122     09/29/16 1300  clindamycin (CLEOCIN) IVPB 600 mg     600 mg 100 mL/hr over 30 Minutes Intravenous Every 8 hours 09/29/16 1122         Objective: Vitals:   10/01/16 0133  10/01/16 0511 10/01/16 0938 10/01/16 1356  BP: 121/81 132/76 (!) 99/58 114/75  Pulse: 80 74  71  Resp: 18 18  16   Temp: 98.6 F (37 C) 98.6 F (37 C)  97.7 F (36.5 C)  TempSrc: Oral Oral  Oral  SpO2: 100% 98%  100%  Weight:      Height:        Intake/Output Summary (Last 24 hours) at 10/01/16 1515 Last data filed at 10/01/16 1410  Gross per 24 hour  Intake          2206.25 ml  Output             2161 ml  Net            45.25 ml   Filed Weights   09/29/16 1033 09/30/16 0317 10/01/16 0028  Weight: 65.8 kg (145 lb) 66.7 kg (147 lb) 68.5 kg (151 lb)    Examination: General exam: Appears comfortable  HEENT: PERRLA, oral mucosa moist, no sclera icterus or thrush Respiratory system: Clear to auscultation. Respiratory effort normal. Cardiovascular system: S1 & S2 heard, RRR.  No murmurs  Gastrointestinal system: Abdomen soft, non-tender, nondistended. Normal bowel sound. No organomegaly Central nervous system: Alert and oriented. No focal neurological deficits. Extremities: No cyanosis, clubbing - left hand and arm in dresssing Skin: No rashes or ulcers Psychiatry:  Mood & affect appropriate.     Data Reviewed: I have personally reviewed following labs and imaging studies  CBC:  Recent Labs Lab 09/27/16 0135 09/28/16 0539 09/30/16 0430 10/01/16 0349  WBC 11.9* 8.8 7.8 7.0  NEUTROABS 6.4  --   --   --   HGB 12.1 11.3* 10.7* 10.6*  HCT 35.7* 34.4* 31.8* 31.4*  MCV 89.9 89.1 88.6 87.2  PLT 300 288 256 259   Basic Metabolic Panel:  Recent Labs Lab 09/27/16 0135 09/28/16 0539 09/29/16 1155 09/30/16 0430 10/01/16 0349  NA 132* 138 132* 136 137  K 3.8 3.8 3.9 3.3* 4.2  CL 99* 106 99* 102 107  CO2 23 25 25 25 24   GLUCOSE 164* 118* 101* 94 117*  BUN 5* <5* 7 7 9   CREATININE 0.94 0.69 0.74 0.73 0.74  CALCIUM 9.2 8.7* 8.6* 8.4* 8.7*  MG  --   --  1.4*  --  1.6*  PHOS  --   --  3.1  --   --    GFR: Estimated Creatinine Clearance: 74.6 mL/min (by C-G formula  based on SCr of 0.74 mg/dL). Liver Function Tests:  Recent Labs Lab 09/27/16 0135 09/29/16 1155  AST 38 35  ALT 25 20  ALKPHOS 79 72  BILITOT 0.4 1.2  PROT 7.9 7.3  ALBUMIN 3.3* 3.0*   No results for input(s): LIPASE, AMYLASE in the last 168 hours. No results for input(s): AMMONIA in the last 168 hours. Coagulation Profile: No results for input(s): INR, PROTIME in the last 168 hours. Cardiac Enzymes: No results for input(s): CKTOTAL, CKMB, CKMBINDEX, TROPONINI in the last 168 hours. BNP (last 3 results) No results for input(s): PROBNP in the last 8760 hours. HbA1C: No results for input(s): HGBA1C in the last 72 hours. CBG:  Recent Labs Lab 09/30/16 0819 09/30/16 2235 10/01/16 0753 10/01/16 0931  GLUCAP 138* 124* 355* 122*   Lipid Profile: No results for input(s): CHOL, HDL, LDLCALC, TRIG, CHOLHDL, LDLDIRECT in the last 72 hours. Thyroid Function Tests: No results for input(s): TSH, T4TOTAL, FREET4, T3FREE, THYROIDAB in the last 72 hours. Anemia Panel: No results for input(s): VITAMINB12, FOLATE, FERRITIN, TIBC, IRON, RETICCTPCT in the last 72 hours. Urine analysis:    Component Value Date/Time   COLORURINE AMBER (A) 09/27/2016 0140   APPEARANCEUR HAZY (A) 09/27/2016 0140   LABSPEC 1.026 09/27/2016 0140   PHURINE 5.0 09/27/2016 0140   GLUCOSEU NEGATIVE 09/27/2016 0140   GLUCOSEU NEG mg/dL 16/01/9603 5409   HGBUR SMALL (A) 09/27/2016 0140   BILIRUBINUR SMALL (A) 09/27/2016 0140   KETONESUR NEGATIVE 09/27/2016 0140   PROTEINUR >=300 (A) 09/27/2016 0140   UROBILINOGEN 0.2 02/22/2014 1200   NITRITE NEGATIVE 09/27/2016 0140   LEUKOCYTESUR NEGATIVE 09/27/2016 0140   Sepsis Labs: @LABRCNTIP (procalcitonin:4,lacticidven:4) ) Recent Results (from the past 240 hour(s))  Culture, blood (Routine x 2)     Status: None (Preliminary result)   Collection Time: 09/27/16  1:20 AM  Result Value Ref Range Status   Specimen Description BLOOD RIGHT ARM  Final   Special  Requests IN PEDIATRIC BOTTLE Blood Culture adequate volume  Final   Culture NO GROWTH 4 DAYS  Final   Report Status PENDING  Incomplete  Culture, blood (Routine x 2)     Status: None (Preliminary result)   Collection Time: 09/27/16  1:39 AM  Result Value Ref Range Status   Specimen Description BLOOD RIGHT HAND  Final   Special Requests   Final    BOTTLES DRAWN  AEROBIC AND ANAEROBIC Blood Culture adequate volume   Culture NO GROWTH 4 DAYS  Final   Report Status PENDING  Incomplete  Urine culture     Status: Abnormal   Collection Time: 09/27/16  2:35 AM  Result Value Ref Range Status   Specimen Description URINE, RANDOM  Final   Special Requests NONE  Final   Culture (A)  Final    50,000 COLONIES/mL LACTOBACILLUS SPECIES Standardized susceptibility testing for this organism is not available.    Report Status 09/28/2016 FINAL  Final  Respiratory Panel by PCR     Status: Abnormal   Collection Time: 09/27/16  6:03 AM  Result Value Ref Range Status   Adenovirus NOT DETECTED NOT DETECTED Final   Coronavirus 229E NOT DETECTED NOT DETECTED Final   Coronavirus HKU1 NOT DETECTED NOT DETECTED Final   Coronavirus NL63 NOT DETECTED NOT DETECTED Final   Coronavirus OC43 NOT DETECTED NOT DETECTED Final   Metapneumovirus NOT DETECTED NOT DETECTED Final   Rhinovirus / Enterovirus DETECTED (A) NOT DETECTED Final   Influenza A NOT DETECTED NOT DETECTED Final   Influenza B NOT DETECTED NOT DETECTED Final   Parainfluenza Virus 1 NOT DETECTED NOT DETECTED Final   Parainfluenza Virus 2 NOT DETECTED NOT DETECTED Final   Parainfluenza Virus 3 NOT DETECTED NOT DETECTED Final   Parainfluenza Virus 4 NOT DETECTED NOT DETECTED Final   Respiratory Syncytial Virus NOT DETECTED NOT DETECTED Final   Bordetella pertussis NOT DETECTED NOT DETECTED Final   Chlamydophila pneumoniae NOT DETECTED NOT DETECTED Final   Mycoplasma pneumoniae NOT DETECTED NOT DETECTED Final  MRSA PCR Screening     Status: None    Collection Time: 09/27/16  6:03 AM  Result Value Ref Range Status   MRSA by PCR NEGATIVE NEGATIVE Final    Comment:        The GeneXpert MRSA Assay (FDA approved for NASAL specimens only), is one component of a comprehensive MRSA colonization surveillance program. It is not intended to diagnose MRSA infection nor to guide or monitor treatment for MRSA infections.   Culture, blood (routine x 2)     Status: None (Preliminary result)   Collection Time: 09/29/16 11:38 AM  Result Value Ref Range Status   Specimen Description BLOOD LEFT ARM  Final   Special Requests   Final    BOTTLES DRAWN AEROBIC ONLY Blood Culture adequate volume   Culture   Final    NO GROWTH 2 DAYS Performed at [facility], [address] [place], [place] [address]    Report Status PENDING  Incomplete  Culture, blood (routine x 2)     Status: None (Preliminary result)   Collection Time: 09/29/16 11:38 AM  Result Value Ref Range Status   Specimen Description BLOOD RIGHT HAND  Final   Special Requests IN PEDIATRIC BOTTLE Blood Culture adequate volume  Final   Culture   Final    NO GROWTH 2 DAYS Performed at [facility], [address] [place], [place] [address]    Report Status PENDING  Incomplete  Aerobic/Anaerobic Culture (surgical/deep wound)     Status: None (Preliminary result)   Collection Time: 09/30/16  9:10 PM  Result Value Ref Range Status   Specimen Description ABSCESS LEFT WRIST/HAND  Final   Special Requests NONE  Final   Gram Stain   Final    RARE WBC PRESENT, PREDOMINANTLY PMN RARE GRAM POSITIVE COCCI IN PAIRS Performed at [facility], [address], [place], [place] [address]    Culture PENDING  Incomplete   Report Status PENDING  Incomplete         Radiology Studies: Mr Hand Left W Wo Contrast  Result Date: 09/29/2016 CLINICAL DATA:  Diffuse soft tissue swelling/ edema and erythema involving the hand and wrist. EXAM: MRI OF THE LEFT HAND WITHOUT AND WITH  CONTRAST; MRI OF THE LEFT WRIST WITHOUT AND WITH CONTRAST TECHNIQUE: Multiplanar, multisequence MR imaging of the left hand and wrist was performed before and after the administration of intravenous contrast. CONTRAST:  14mL MULTIHANCE GADOBENATE DIMEGLUMINE 529 MG/ML IV SOLN COMPARISON:  None. FINDINGS: There is severe diffuse subcutaneous soft tissue swelling/ edema/fluid most notably involving the radial aspect of hand and wrist. Findings consistent with severe cellulitis. There is also the soft tissue abscess in the webspace between the thumb and second metacarpal. This measures 13 x 11 mm and is surrounded by a thick rim enhancing soft tissue/inflammatory phlegmon. Slightly more proximally there is a cluster of microabscesses located more superficially. There is also significant myositis involving the thenar musculature. No definite findings for pyomyositis. Signal abnormality in the first metacarpal is worrisome for osteomyelitis. No definite MR findings for [facility] septic arthritis. IMPRESSION: Diffuse and severe cellulitis involving the hand and wrist. Soft tissue abscess in the webspace between the thumb and second metacarpal and also in the subcutaneous tissues more proximally. Extensive myositis involving the thenar musculature without definite pyomyositis. Acute versus chronic osteomyelitis involving the first metacarpal. These results will be called to the ordering clinician or representative by the Radiologist Assistant, and communication documented in the PACS or zVision Dashboard. Electronically Signed   By: Rudie Meyer M.D.   On: 09/29/2016 17:10   Mr Wrist Left W Wo Contrast  Result Date: 09/29/2016 CLINICAL DATA:  Diffuse soft tissue swelling/ edema and erythema involving the hand and wrist. EXAM: MRI OF THE LEFT HAND WITHOUT AND WITH CONTRAST; MRI OF THE LEFT WRIST WITHOUT AND WITH CONTRAST TECHNIQUE: Multiplanar, multisequence MR imaging of the left hand and wrist was performed before and  after the administration of intravenous contrast. CONTRAST:  14mL MULTIHANCE GADOBENATE DIMEGLUMINE 529 MG/ML IV SOLN COMPARISON:  None. FINDINGS: There is severe diffuse subcutaneous soft tissue swelling/ edema/fluid most notably involving the radial aspect of hand and wrist. Findings consistent with severe cellulitis. There is also the soft tissue abscess in the webspace between the thumb and second metacarpal. This measures 13 x 11 mm and is surrounded by a thick rim enhancing soft tissue/inflammatory phlegmon. Slightly more proximally there is a cluster of microabscesses located more superficially. There is also significant myositis involving the thenar musculature. No definite findings for pyomyositis. Signal abnormality in the first metacarpal is worrisome for osteomyelitis. No definite MR findings for [facility] septic arthritis. IMPRESSION: Diffuse and severe cellulitis involving the hand and wrist. Soft tissue abscess in the webspace between the thumb and second metacarpal and also in the subcutaneous tissues more proximally. Extensive myositis involving the thenar musculature without definite pyomyositis. Acute versus chronic osteomyelitis involving the first metacarpal. These results will be called to the ordering clinician or representative by the Radiologist Assistant, and communication documented in the PACS or zVision Dashboard. Electronically Signed   By: Rudie Meyer M.D.   On: 09/29/2016 17:10      Scheduled Meds: . abacavir-dolutegravir-lamiVUDine  1 tablet Oral Daily  . amLODipine  5 mg Oral Daily  . darunavir-cobicistat  1 tablet Oral Q breakfast  . loratadine  10 mg Oral  Daily  . traZODone  100 mg Oral QHS   Continuous Infusions: . sodium chloride 1,000 mL (10/01/16 1152)  . clindamycin (CLEOCIN) IV 600 mg (10/01/16 1350)     LOS: 2 days    Time spent in minutes: 35    Calvert Cantor, MD [facility] Pager: [email or web address] Password [facility] 10/01/2016, 3:15 PM

## 2016-10-01 NOTE — Op Note (Signed)
NAME:  Courtney Ellis, Courtney Ellis NO.:  MEDICAL RECORD NO.:  [id number]  LOCATION:                                 FACILITY:  PHYSICIAN:  Courtney Ano. Chancy Claros, M.D.DATE OF BIRTH:  19-Aug-1960  DATE OF PROCEDURE:09/30/2016 DATE OF DISCHARGE:                              OPERATIVE REPORT   PREOPERATIVE DIAGNOSIS:  History of IV drug abuse with abscess, left wrist and left hand multiloculated.  POSTOPERATIVE DIAGNOSIS:  History of IV drug abuse with abscess, left wrist and left hand multiloculated.  PROCEDURE: 1. Irrigation and debridement of deep complicated abscess in the     thenar space, left hand. 2. An extensor pollicis longus tenolysis, tenosynovectomy. 3. Extensor digitorum communis tenosynovectomy extensive in nature. 4. Tying off venous architecture first webspace, left hand secondary     to embedded heroin debris. 5. Fasciotomy, left hand about the thenar space, extensive in nature.  SURGEON:  Courtney Ano. Amanda Pea, M.D.  ASSISTANT:  None.  COMPLICATIONS:  None.  ANESTHESIA:  Block with IV sedation.  TOURNIQUET TIME:  Less than an hour.  DRAINS:  One.  INDICATIONS:  This patient is a 55 year old female with history of multiple problems including substance abuse, HIV, and hepatitis.  She presents with the above-mentioned findings of abscess.  My consult note reflects this.  The patient has performed poorly with IV antibiotics. She has had an infection for 5-6 days and is quite apparent.  She is going to need a surgical I and D.  She consents and will proceed emergently.  OPERATIVE PROCEDURE:  The patient was seen by myself and Anesthesia. Taken to the operative theater, underwent smooth induction of block anesthesia by Dr. Michelle Piper of Anesthesia.  Following this, she was given sedation, prepped and draped in a usual sterile fashion Betadine scrub and paint.  Following this, time-out was observed.  An incision was made approximately 1 to 1-1/2 inches  dorsally about the 1st web space. Dissection was carried down.  I immediately encountered abscess material deep in location.  This was cultured for aerobic and anaerobic culture. There was 10 mL minimum of large thick pus material.  This is very purulent.  There was devitalized skin tissue and heroin debris in the area.  I very carefully isolated the venous contributories with heroin debris ladened in them and then tied this off.  This is very complicated to map out due to the fragility of the venous architecture, but it was done without difficulty. Following this, I then performed a fasciotomy of the first dorsal compartment.  I made a counter incision in the volar webspace (first webspace) and placed a Penrose drain through and through.  I then removed pre necrotic and necrotic debris.  Following this, I performed extensor digitorum communis, tenolysis, and tenosynovectomy.  Following this, I performed an extensor pollicis longus tenolysis, tenosynovectomy.  I very carefully identified the princeps pollicis artery and dissected this to make sure it was stable, it was.  A lot of deep devitalized skin tissue was removed.  I did not take a biopsy of the bone.  Following the decompression of the very large abscess, we then irrigated with 3  L of saline.  Following this, I left the wound open, placed Adaptic, and noted hemostasis was excellent with tourniquet deflated. We then placed a large bulky bandage on the patient.  We will begin wet-to-dry dressing changes and Waterpik application on Thursday.  Elevate, keep the area clean and dry.  I recommend that she stay away from IV drugs and other issues.  Hopefully, she will go on to heal the wound without tremendous difficulty; however, given her medical issues, I would give her variable prognosis at this juncture.     Courtney Ellis, M.D.     [facility]  D:  09/30/2016  T:  09/30/2016  Job:  [id number]

## 2016-10-02 DIAGNOSIS — F1110 Opioid abuse, uncomplicated: Secondary | ICD-10-CM

## 2016-10-02 DIAGNOSIS — F1910 Other psychoactive substance abuse, uncomplicated: Secondary | ICD-10-CM

## 2016-10-02 LAB — CULTURE, BLOOD (ROUTINE X 2)
CULTURE: NO GROWTH
CULTURE: NO GROWTH
SPECIAL REQUESTS: ADEQUATE
SPECIAL REQUESTS: ADEQUATE

## 2016-10-02 LAB — GLUCOSE, CAPILLARY: Glucose-Capillary: 106 mg/dL — ABNORMAL HIGH (ref 65–99)

## 2016-10-02 MED ORDER — KETOROLAC TROMETHAMINE 30 MG/ML IJ SOLN
30.0000 mg | Freq: Four times a day (QID) | INTRAMUSCULAR | Status: DC
  Administered 2016-10-02 – 2016-10-04 (×7): 30 mg via INTRAVENOUS
  Filled 2016-10-02 (×6): qty 1

## 2016-10-02 NOTE — Progress Notes (Signed)
Subjective: 2 Days Post-Op Procedure(s) (LRB): IRRIGATION AND DEBRIDEMENT LEFT WRIST AND HAND (Left) Patient reports pain as  controlled. She rested well last night. She denies nausea, vomiting, fever or chills. She describes mild pain to the hand. She denies numbness or tingling about the upper extremity.  Objective: Vital signs in last 24 hours: Temp:  [97.7 F (36.5 C)-99.1 F (37.3 C)] 99.1 F (37.3 C) (06/14 0542) Pulse Rate:  [69-81] 69 (06/14 0542) Resp:  [16-18] 16 (06/14 0542) BP: (99-133)/(58-82) 133/82 (06/14 0542) SpO2:  [95 %-100 %] 95 % (06/14 0542) Weight:  [66.8 kg (147 lb 4.3 oz)] 66.8 kg (147 lb 4.3 oz) (06/14 0542)  Intake/Output from previous day: 06/13 0701 - 06/14 0700 In: 1650 [P.O.:1000; I.V.:600; IV Piggyback:50] Out: 3050 [Urine:3050] Intake/Output this shift: No intake/output data recorded. Results for orders placed or performed during the hospital encounter of 09/29/16  Culture, blood (routine x 2)     Status: None (Preliminary result)   Collection Time: 09/29/16 11:38 AM  Result Value Ref Range Status   Specimen Description BLOOD LEFT ARM  Final   Special Requests   Final    BOTTLES DRAWN AEROBIC ONLY Blood Culture adequate volume   Culture   Final    NO GROWTH 2 DAYS Performed at [facility], [address] [place], [place] [address]    Report Status PENDING  Incomplete  Culture, blood (routine x 2)     Status: None (Preliminary result)   Collection Time: 09/29/16 11:38 AM  Result Value Ref Range Status   Specimen Description BLOOD RIGHT HAND  Final   Special Requests IN PEDIATRIC BOTTLE Blood Culture adequate volume  Final   Culture   Final    NO GROWTH 2 DAYS Performed at [facility], [address] [place], [place] [address]    Report Status PENDING  Incomplete  Aerobic/Anaerobic Culture (surgical/deep wound)     Status: None (Preliminary result)   Collection Time: 09/30/16  9:10 PM  Result Value Ref Range Status   Specimen Description ABSCESS LEFT WRIST/HAND  Final   Special Requests NONE  Final   Gram Stain   Final    RARE WBC PRESENT, PREDOMINANTLY PMN RARE GRAM POSITIVE COCCI IN PAIRS Performed at [facility], [address] [place], [place] [address]    Culture PENDING  Incomplete   Report Status PENDING  Incomplete    Recent Labs  09/30/16 0430 10/01/16 0349  HGB 10.7* 10.6*    Recent Labs  09/30/16 0430 10/01/16 0349  WBC 7.8 7.0  RBC 3.59* 3.60*  HCT 31.8* 31.4*  PLT 256 259    Recent Labs  09/30/16 0430 10/01/16 0349  NA 136 137  K 3.3* 4.2  CL 102 107  CO2 25 24  BUN 7 9  CREATININE 0.73 0.74  GLUCOSE 94 117*  CALCIUM 8.4* 8.7*   No results for input(s): LABPT, INR in the last 72 hours.  The patient is alert and oriented in no acute distress. Left upper extremity:  Dressings are removed without difficulties. The patient has moderate edema of the digits and dorsal hand, the drain is intact there is no purulent discharge there is some degree of proteinaceous debris present and mild serous drainage. We have pulled the drain without difficulties. Wound was expressed without any purulence present. Following this copious irrigation was implemented into the wound and a wet-to-dry dressing was placed about the wound. She was rewrapped and soft dressings. There  is no ascending cellulitis present generally she is much improved in regards to her tenderness on examination today as compared to her preoperative state of affairs.  The patient is noted to have a normal HEENT exam. Lung fields show equal chest expansion and no shortness of breath. Abdomen exam is nontender without distention. Lower extremity examination does not show any fracture dislocation or blood clot symptoms. Pelvis is stable and the neck and back are stable and nontender.  Assessment/Plan: 2 Days Post-Op Procedure(s) (LRB): IRRIGATION AND DEBRIDEMENT LEFT WRIST AND HAND (Left) We will begin  hydrotherapy today to the left upper extremity this will need to be performed daily with wet-to-dry dressings/wicking being placed after hydrotherapy. I have discussed with the patient the imminent need for diligent elevation, finger range of motion massage and edema control. She exhibits this very well with flexion and extension states she will work on this. I discussed with her the severity of her infection the fact that her cultures are still pending as we isolate a bacteria we will transition to by mouth antibiotics. During the interim daily hydrotherapy and wound care will be implemented. All questions were encouraged and answered.We have discussed with the patient the issues regarding their infection to the extremity. We will continue antibiotics and await culture results. Often times it will take 3-5 days for cultures to become final. During this time we will typically have the patient on intravenous antibiotics until we can find a parenteral route of antibiotic regime specific for the bacteria or organism isolated. We have discussed with the patient the need for daily irrigation and debridement as well as therapy to the area. We have discussed with the patient the necessity of range of motion to the involved joints as discussed today. We have discussed with the patient the unpredictability of infections at times. We'll continue to work towards good pain control and restoration of function. The patient understands the need for meticulous wound care and the necessity of proper followup.  The possible complications of stiffness (loss of motion), resistant infection, possible deep bone infection, possible chronic pain issues, possible need for multiple surgeries and even amputation.  With this in mind the patient understands our goal is to eradicate the infection to quiesence. We will continue to work towards these goals.  Ramie Erman L 10/02/2016, 8:02 AM

## 2016-10-02 NOTE — Progress Notes (Signed)
PROGRESS NOTE    Courtney Ellis   ZOX:[id number]  DOB: July 21, 1960  DOA: 09/29/2016 PCP: Adah Salvage, NP   Brief Narrative:  56 yo female with HIV on ART, hep C, heroin use recently hospitalized at [facility] for sepsis due to rhinovirus and left arm cellulitis related to a heroin injection but left AMA on 6/10. She returns for increased swelling of the hand and arm , fevers and chills.   Subjective: C/o ongoing pain in left arm and hand which the Oxycodone is not resolving. No nausea, vomiting, diarrhea or constipation. No dyspnea or cough.    Assessment & Plan:   Principal Problem:   Left hand/ arm cellulitis and abscess at site of Heroin injection  Sepsis - MRI suggestive of cellulitis, abscess, myositis and possibly osteomyelitis -see report below - s/p I and D by Dr Amanda Pea on 6/12 - await cultures and cont Clindamycin - will have hydrotherapy today  Active Problems:  HIV - CD 4 count 350 on 2/18- cont Triumeq and repeat CD 4 count    Chronic hepatitis C without hepatic coma  - outpt f/u    Essential hypertension - Norvasc    Substance abuse with Heroin   - needs counseling   DVT prophylaxis: Lovenox  Code Status: Full code Family Communication: son at bedside Disposition Plan: home when stable Consultants:   Dr Amanda Pea- hand surgery Procedures:   I and D, fasciotomy, tenosynovectomy  Antimicrobials:  Anti-infectives    Start     Dose/Rate Route Frequency Ordered Stop   09/30/16 1200  darunavir-cobicistat (PREZCOBIX) 800-150 MG per tablet 1 tablet     1 tablet Oral Daily with breakfast 09/30/16 0940     09/29/16 1700  darunavir-cobicistat (PREZCOBIX) 800-150 MG per tablet 1 tablet  Status:  Discontinued     1 tablet Oral Daily 09/29/16 1122 09/30/16 0940   09/29/16 1500  abacavir-dolutegravir-lamiVUDine (TRIUMEQ) 600-50-300 MG per tablet 1 tablet     1 tablet Oral Daily 09/29/16 1122     09/29/16 1300  clindamycin (CLEOCIN) IVPB 600 mg     600 mg 100 mL/hr over  30 Minutes Intravenous Every 8 hours 09/29/16 1122         Objective: Vitals:   10/01/16 0938 10/01/16 1356 10/01/16 2058 10/02/16 0542  BP: (!) 99/58 114/75 111/71 133/82  Pulse:  71 81 69  Resp:  16 18 16   Temp:  97.7 F (36.5 C) 98.4 F (36.9 C) 99.1 F (37.3 C)  TempSrc:  Oral Oral Oral  SpO2:  100% 100% 95%  Weight:    66.8 kg (147 lb 4.3 oz)  Height:        Intake/Output Summary (Last 24 hours) at 10/02/16 1346 Last data filed at 10/02/16 0939  Gross per 24 hour  Intake             1410 ml  Output             2850 ml  Net            -1440 ml   Filed Weights   09/30/16 0317 10/01/16 0028 10/02/16 0542  Weight: 66.7 kg (147 lb) 68.5 kg (151 lb) 66.8 kg (147 lb 4.3 oz)    Examination: General exam: Appears comfortable  HEENT: PERRLA, oral mucosa moist, no sclera icterus or thrush Respiratory system: Clear to auscultation. Respiratory effort normal. Cardiovascular system: S1 & S2 heard, RRR.  No murmurs  Gastrointestinal system: Abdomen soft, non-tender, nondistended. Normal bowel sound.  No organomegaly Central nervous system: Alert and oriented. No focal neurological deficits. Extremities: No cyanosis, clubbing - left hand and arm in dresssing Skin: No rashes or ulcers Psychiatry:  Mood & affect appropriate.     Data Reviewed: I have personally reviewed following labs and imaging studies  CBC:  Recent Labs Lab 09/27/16 0135 09/28/16 0539 09/30/16 0430 10/01/16 0349  WBC 11.9* 8.8 7.8 7.0  NEUTROABS 6.4  --   --   --   HGB 12.1 11.3* 10.7* 10.6*  HCT 35.7* 34.4* 31.8* 31.4*  MCV 89.9 89.1 88.6 87.2  PLT 300 288 256 259   Basic Metabolic Panel:  Recent Labs Lab 09/27/16 0135 09/28/16 0539 09/29/16 1155 09/30/16 0430 10/01/16 0349  NA 132* 138 132* 136 137  K 3.8 3.8 3.9 3.3* 4.2  CL 99* 106 99* 102 107  CO2 23 25 25 25 24   GLUCOSE 164* 118* 101* 94 117*  BUN 5* <5* 7 7 9   CREATININE 0.94 0.69 0.74 0.73 0.74  CALCIUM 9.2 8.7* 8.6* 8.4*  8.7*  MG  --   --  1.4*  --  1.6*  PHOS  --   --  3.1  --   --    GFR: Estimated Creatinine Clearance: 73.9 mL/min (by C-G formula based on SCr of 0.74 mg/dL). Liver Function Tests:  Recent Labs Lab 09/27/16 0135 09/29/16 1155  AST 38 35  ALT 25 20  ALKPHOS 79 72  BILITOT 0.4 1.2  PROT 7.9 7.3  ALBUMIN 3.3* 3.0*   No results for input(s): LIPASE, AMYLASE in the last 168 hours. No results for input(s): AMMONIA in the last 168 hours. Coagulation Profile: No results for input(s): INR, PROTIME in the last 168 hours. Cardiac Enzymes: No results for input(s): CKTOTAL, CKMB, CKMBINDEX, TROPONINI in the last 168 hours. BNP (last 3 results) No results for input(s): PROBNP in the last 8760 hours. HbA1C: No results for input(s): HGBA1C in the last 72 hours. CBG:  Recent Labs Lab 09/30/16 0819 09/30/16 2235 10/01/16 0753 10/01/16 0931 10/02/16 0810  GLUCAP 138* 124* 355* 122* 106*   Lipid Profile: No results for input(s): CHOL, HDL, LDLCALC, TRIG, CHOLHDL, LDLDIRECT in the last 72 hours. Thyroid Function Tests: No results for input(s): TSH, T4TOTAL, FREET4, T3FREE, THYROIDAB in the last 72 hours. Anemia Panel: No results for input(s): VITAMINB12, FOLATE, FERRITIN, TIBC, IRON, RETICCTPCT in the last 72 hours. Urine analysis:    Component Value Date/Time   COLORURINE AMBER (A) 09/27/2016 0140   APPEARANCEUR HAZY (A) 09/27/2016 0140   LABSPEC 1.026 09/27/2016 0140   PHURINE 5.0 09/27/2016 0140   GLUCOSEU NEGATIVE 09/27/2016 0140   GLUCOSEU NEG mg/dL 40/98/1191 4782   HGBUR SMALL (A) 09/27/2016 0140   BILIRUBINUR SMALL (A) 09/27/2016 0140   KETONESUR NEGATIVE 09/27/2016 0140   PROTEINUR >=300 (A) 09/27/2016 0140   UROBILINOGEN 0.2 02/22/2014 1200   NITRITE NEGATIVE 09/27/2016 0140   LEUKOCYTESUR NEGATIVE 09/27/2016 0140   Sepsis Labs: @LABRCNTIP (procalcitonin:4,lacticidven:4) ) Recent Results (from the past 240 hour(s))  Culture, blood (Routine x 2)     Status: None  (Preliminary result)   Collection Time: 09/27/16  1:20 AM  Result Value Ref Range Status   Specimen Description BLOOD RIGHT ARM  Final   Special Requests IN PEDIATRIC BOTTLE Blood Culture adequate volume  Final   Culture NO GROWTH 4 DAYS  Final   Report Status PENDING  Incomplete  Culture, blood (Routine x 2)     Status: None (Preliminary result)  Collection Time: 09/27/16  1:39 AM  Result Value Ref Range Status   Specimen Description BLOOD RIGHT HAND  Final   Special Requests   Final    BOTTLES DRAWN AEROBIC AND ANAEROBIC Blood Culture adequate volume   Culture NO GROWTH 4 DAYS  Final   Report Status PENDING  Incomplete  Urine culture     Status: Abnormal   Collection Time: 09/27/16  2:35 AM  Result Value Ref Range Status   Specimen Description URINE, RANDOM  Final   Special Requests NONE  Final   Culture (A)  Final    50,000 COLONIES/mL LACTOBACILLUS SPECIES Standardized susceptibility testing for this organism is not available.    Report Status 09/28/2016 FINAL  Final  Respiratory Panel by PCR     Status: Abnormal   Collection Time: 09/27/16  6:03 AM  Result Value Ref Range Status   Adenovirus NOT DETECTED NOT DETECTED Final   Coronavirus 229E NOT DETECTED NOT DETECTED Final   Coronavirus HKU1 NOT DETECTED NOT DETECTED Final   Coronavirus NL63 NOT DETECTED NOT DETECTED Final   Coronavirus OC43 NOT DETECTED NOT DETECTED Final   Metapneumovirus NOT DETECTED NOT DETECTED Final   Rhinovirus / Enterovirus DETECTED (A) NOT DETECTED Final   Influenza A NOT DETECTED NOT DETECTED Final   Influenza B NOT DETECTED NOT DETECTED Final   Parainfluenza Virus 1 NOT DETECTED NOT DETECTED Final   Parainfluenza Virus 2 NOT DETECTED NOT DETECTED Final   Parainfluenza Virus 3 NOT DETECTED NOT DETECTED Final   Parainfluenza Virus 4 NOT DETECTED NOT DETECTED Final   Respiratory Syncytial Virus NOT DETECTED NOT DETECTED Final   Bordetella pertussis NOT DETECTED NOT DETECTED Final    Chlamydophila pneumoniae NOT DETECTED NOT DETECTED Final   Mycoplasma pneumoniae NOT DETECTED NOT DETECTED Final  MRSA PCR Screening     Status: None   Collection Time: 09/27/16  6:03 AM  Result Value Ref Range Status   MRSA by PCR NEGATIVE NEGATIVE Final    Comment:        The GeneXpert MRSA Assay (FDA approved for NASAL specimens only), is one component of a comprehensive MRSA colonization surveillance program. It is not intended to diagnose MRSA infection nor to guide or monitor treatment for MRSA infections.   Culture, blood (routine x 2)     Status: None (Preliminary result)   Collection Time: 09/29/16 11:38 AM  Result Value Ref Range Status   Specimen Description BLOOD LEFT ARM  Final   Special Requests   Final    BOTTLES DRAWN AEROBIC ONLY Blood Culture adequate volume   Culture   Final    NO GROWTH 2 DAYS Performed at [facility], [address] [place], [place] [address]    Report Status PENDING  Incomplete  Culture, blood (routine x 2)     Status: None (Preliminary result)   Collection Time: 09/29/16 11:38 AM  Result Value Ref Range Status   Specimen Description BLOOD RIGHT HAND  Final   Special Requests IN PEDIATRIC BOTTLE Blood Culture adequate volume  Final   Culture   Final    NO GROWTH 2 DAYS Performed at [facility], [address] [place], [place] [address]    Report Status PENDING  Incomplete  Aerobic/Anaerobic Culture (surgical/deep wound)     Status: None (Preliminary result)   Collection Time: 09/30/16  9:10 PM  Result Value Ref Range Status   Specimen Description ABSCESS LEFT WRIST/HAND  Final   Special Requests  NONE  Final   Gram Stain   Final    RARE WBC PRESENT, PREDOMINANTLY PMN RARE GRAM POSITIVE COCCI IN PAIRS    Culture   Final    MODERATE STAPHYLOCOCCUS AUREUS SUSCEPTIBILITIES TO FOLLOW Performed at [facility], [address] [place], [place] [address]    Report Status PENDING  Incomplete          Radiology Studies: No results found.    Scheduled Meds: . abacavir-dolutegravir-lamiVUDine  1 tablet Oral Daily  . amLODipine  5 mg Oral Daily  . darunavir-cobicistat  1 tablet Oral Q breakfast  . enoxaparin (LOVENOX) injection  40 mg Subcutaneous Q24H  . ketorolac  30 mg Intravenous Q6H  . loratadine  10 mg Oral Daily  . traZODone  100 mg Oral QHS   Continuous Infusions: . clindamycin (CLEOCIN) IV Stopped (10/02/16 0540)     LOS: 3 days    Time spent in minutes: 35    Calvert Cantor, MD [facility] Pager: [email or web address] Password [email or web address] 10/02/2016, 1:46 PM

## 2016-10-02 NOTE — Progress Notes (Addendum)
PT HYDROTHERAPY EVAL and TREATMENT  10/02/16 1200  Subjective Assessment  Subjective is it going to hurt  Patient and Family Stated Goals wound healed  Date of Onset (unsure, prior to admission)  Prior Treatments I and D  Evaluation and Treatment  exercises Pt instructed in Digit, hand, wrist AROM  Evaluation and Treatment Procedures Explained to Patient/Family Yes    Evaluation and Treatment Procedures agreed to  Wound / Incision (Open or Dehisced) 10/02/16 Incision - Open Hand Left open incision L dorsum of hand and thenar eminence  Date First Assessed/Time First Assessed: 10/02/16 1210   Wound Type: Incision - Open  Location: Hand  Location Orientation: Left  Wound Description (Comments): open incision L dorsum of hand and thenar eminence  Present on Admission: (c) No  Dressing Type Gauze (Comment);Moist to dry  Dressing Change Frequency Daily  Site / Wound Assessment Granulation tissue  % Wound base Red or Granulating 90%  % Wound base Yellow/Fibrinous Exudate 10%  Peri-wound Assessment Intact;Edema  Wound Length (cm) 3 cm (very small incision (.2 x .2 x .3) thenar eminence)  Wound Width (cm) 1 cm  Wound Depth (cm) 0.1 cm  Drainage Amount Minimal  Drainage Description Serosanguineous  Treatment Cleansed;Hydrotherapy (Pulse lavage);Packing (Saline gauze)  Hydrotherapy  Pulsed lavage therapy - wound location L hand   Pulsed Lavage with Suction (psi) 4 psi  Pulsed Lavage with Suction - Normal Saline Used 1000 mL  Pulsed Lavage Tip Tip with splash shield  Wound Therapy - Assess/Plan/Recommendations  Wound Therapy - Clinical Statement pt will benefit from hydrotherapy for cleansing, debridement and pt education for  L hand wound  Wound Therapy - Functional Problem List decr functional use L hand   Factors Delaying/Impairing Wound Healing Infection - systemic/local;Substance abuse  Hydrotherapy Plan Debridement;Dressing change;Patient/family education;Pulsatile lavage with suction   Wound Therapy - Frequency 6X / week  Wound Plan see clincal statement  Wound Therapy Goals - Improve the function of patient's integumentary system by progressing the wound(s) through the phases of wound healing by:  Decrease Necrotic Tissue to 5  Decrease Necrotic Tissue - Progress Goal set today  Increase Granulation Tissue to 95  Increase Granulation Tissue - Progress Goal set today  Goals/treatment plan/discharge plan were made with and agreed upon by patient/family Yes  Time For Goal Achievement 7 days  Wound Therapy - Potential for Goals Good  Courtney Ellis, PT Pager: [phone] 10/02/2016

## 2016-10-03 MED ORDER — CEFAZOLIN SODIUM-DEXTROSE 1-4 GM/50ML-% IV SOLN
1.0000 g | Freq: Three times a day (TID) | INTRAVENOUS | Status: DC
  Administered 2016-10-03 – 2016-10-04 (×4): 1 g via INTRAVENOUS
  Filled 2016-10-03 (×5): qty 50

## 2016-10-03 NOTE — Progress Notes (Signed)
PROGRESS NOTE    Courtney Ellis   HYQ:[id number]  DOB: 08/22/1960  DOA: 09/29/2016 PCP: Adah Salvageulmer, Dawn C, NP   Brief Narrative:  56 yo female with HIV on ART, hep C, heroin use recently hospitalized at [facility] for sepsis due to rhinovirus and left arm cellulitis related to a heroin injection but left AMA on 6/10. She returns for increased swelling of the hand and arm , fevers and chills.   Subjective: No complaints today. Pain is reasonably controlled. Would like to go home.   Assessment & Plan:   Principal Problem:   Left hand/ arm cellulitis and abscess at site of Heroin injection  Sepsis - MRI suggestive of cellulitis, abscess, myositis and possibly osteomyelitis -see report below - s/p I and D by Dr Amanda PeaGramig on 6/12 -   cultures reveal MSSA- will switch to Ancef- have spoken with ID in regards to treatment of Osteomyelitis with IV antibiotics vs Oral. Dr Orvan Falconerampbell has recommended 6 wks of oral antibiotics.     Active Problems:  HIV - CD 4 count 350 on 2/18- cont Triumeq and repeat CD 4 count    Chronic hepatitis C without hepatic coma  - outpt f/u    Essential hypertension - Norvasc    Substance abuse with Heroin   - needs counseling   DVT prophylaxis: Lovenox  Code Status: Full code Family Communication: son at bedside Disposition Plan: home when stable Consultants:   Dr Amanda PeaGramig- hand surgery Procedures:   I and D, fasciotomy, tenosynovectomy  Antimicrobials:  Anti-infectives    Start     Dose/Rate Route Frequency Ordered Stop   10/03/16 1400  ceFAZolin (ANCEF) IVPB 1 g/50 mL premix     1 g 100 mL/hr over 30 Minutes Intravenous Every 8 hours 10/03/16 1011     09/30/16 1200  darunavir-cobicistat (PREZCOBIX) 800-150 MG per tablet 1 tablet     1 tablet Oral Daily with breakfast 09/30/16 0940     09/29/16 1700  darunavir-cobicistat (PREZCOBIX) 800-150 MG per tablet 1 tablet  Status:  Discontinued     1 tablet Oral Daily 09/29/16 1122 09/30/16 0940   09/29/16 1500   abacavir-dolutegravir-lamiVUDine (TRIUMEQ) 600-50-300 MG per tablet 1 tablet     1 tablet Oral Daily 09/29/16 1122     09/29/16 1300  clindamycin (CLEOCIN) IVPB 600 mg  Status:  Discontinued     600 mg 100 mL/hr over 30 Minutes Intravenous Every 8 hours 09/29/16 1122 10/03/16 1011       Objective: Vitals:   10/02/16 1345 10/02/16 2245 10/03/16 0500 10/03/16 0621  BP: 117/77 113/76  (!) 141/95  Pulse: 70 75  66  Resp: 16 18  18   Temp: 98.1 F (36.7 C) 98.2 F (36.8 C)  98 F (36.7 C)  TempSrc: Oral Oral  Oral  SpO2: 99% 100%  100%  Weight:   67.8 kg (149 lb 7.6 oz)   Height:        Intake/Output Summary (Last 24 hours) at 10/03/16 1447 Last data filed at 10/03/16 0858  Gross per 24 hour  Intake             1060 ml  Output             2000 ml  Net             -940 ml   Filed Weights   10/01/16 0028 10/02/16 0542 10/03/16 0500  Weight: 68.5 kg (151 lb) 66.8 kg (147 lb 4.3 oz) 67.8 kg (  149 lb 7.6 oz)    Examination: General exam: Appears comfortable  HEENT: PERRLA, oral mucosa moist, no sclera icterus or thrush Respiratory system: Clear to auscultation. Respiratory effort normal. Cardiovascular system: S1 & S2 heard, RRR.  No murmurs  Gastrointestinal system: Abdomen soft, non-tender, nondistended. Normal bowel sound. No organomegaly Central nervous system: Alert and oriented. No focal neurological deficits. Extremities: No cyanosis, clubbing - left hand wounds appear clean- no surrounding cellulitis Skin: No rashes or ulcers Psychiatry:  Mood & affect appropriate.     Data Reviewed: I have personally reviewed following labs and imaging studies  CBC:  Recent Labs Lab 09/27/16 0135 09/28/16 0539 09/30/16 0430 10/01/16 0349  WBC 11.9* 8.8 7.8 7.0  NEUTROABS 6.4  --   --   --   HGB 12.1 11.3* 10.7* 10.6*  HCT 35.7* 34.4* 31.8* 31.4*  MCV 89.9 89.1 88.6 87.2  PLT 300 288 256 259   Basic Metabolic Panel:  Recent Labs Lab 09/27/16 0135 09/28/16 0539  09/29/16 1155 09/30/16 0430 10/01/16 0349  NA 132* 138 132* 136 137  K 3.8 3.8 3.9 3.3* 4.2  CL 99* 106 99* 102 107  CO2 23 25 25 25 24   GLUCOSE 164* 118* 101* 94 117*  BUN 5* <5* 7 7 9   CREATININE 0.94 0.69 0.74 0.73 0.74  CALCIUM 9.2 8.7* 8.6* 8.4* 8.7*  MG  --   --  1.4*  --  1.6*  PHOS  --   --  3.1  --   --    GFR: Estimated Creatinine Clearance: 74.4 mL/min (by C-G formula based on SCr of 0.74 mg/dL). Liver Function Tests:  Recent Labs Lab 09/27/16 0135 09/29/16 1155  AST 38 35  ALT 25 20  ALKPHOS 79 72  BILITOT 0.4 1.2  PROT 7.9 7.3  ALBUMIN 3.3* 3.0*   No results for input(s): LIPASE, AMYLASE in the last 168 hours. No results for input(s): AMMONIA in the last 168 hours. Coagulation Profile: No results for input(s): INR, PROTIME in the last 168 hours. Cardiac Enzymes: No results for input(s): CKTOTAL, CKMB, CKMBINDEX, TROPONINI in the last 168 hours. BNP (last 3 results) No results for input(s): PROBNP in the last 8760 hours. HbA1C: No results for input(s): HGBA1C in the last 72 hours. CBG:  Recent Labs Lab 09/30/16 0819 09/30/16 2235 10/01/16 0753 10/01/16 0931 10/02/16 0810  GLUCAP 138* 124* 355* 122* 106*   Lipid Profile: No results for input(s): CHOL, HDL, LDLCALC, TRIG, CHOLHDL, LDLDIRECT in the last 72 hours. Thyroid Function Tests: No results for input(s): TSH, T4TOTAL, FREET4, T3FREE, THYROIDAB in the last 72 hours. Anemia Panel: No results for input(s): VITAMINB12, FOLATE, FERRITIN, TIBC, IRON, RETICCTPCT in the last 72 hours. Urine analysis:    Component Value Date/Time   COLORURINE AMBER (A) 09/27/2016 0140   APPEARANCEUR HAZY (A) 09/27/2016 0140   LABSPEC 1.026 09/27/2016 0140   PHURINE 5.0 09/27/2016 0140   GLUCOSEU NEGATIVE 09/27/2016 0140   GLUCOSEU NEG mg/dL 16/01/9603 5409   HGBUR SMALL (A) 09/27/2016 0140   BILIRUBINUR SMALL (A) 09/27/2016 0140   KETONESUR NEGATIVE 09/27/2016 0140   PROTEINUR >=300 (A) 09/27/2016 0140    UROBILINOGEN 0.2 02/22/2014 1200   NITRITE NEGATIVE 09/27/2016 0140   LEUKOCYTESUR NEGATIVE 09/27/2016 0140   Sepsis Labs: @LABRCNTIP (procalcitonin:4,lacticidven:4) ) Recent Results (from the past 240 hour(s))  Culture, blood (Routine x 2)     Status: None   Collection Time: 09/27/16  1:20 AM  Result Value Ref Range Status   Specimen  Description BLOOD RIGHT ARM  Final   Special Requests IN PEDIATRIC BOTTLE Blood Culture adequate volume  Final   Culture NO GROWTH 5 DAYS  Final   Report Status 10/02/2016 FINAL  Final  Culture, blood (Routine x 2)     Status: None   Collection Time: 09/27/16  1:39 AM  Result Value Ref Range Status   Specimen Description BLOOD RIGHT HAND  Final   Special Requests   Final    BOTTLES DRAWN AEROBIC AND ANAEROBIC Blood Culture adequate volume   Culture NO GROWTH 5 DAYS  Final   Report Status 10/02/2016 FINAL  Final  Urine culture     Status: Abnormal   Collection Time: 09/27/16  2:35 AM  Result Value Ref Range Status   Specimen Description URINE, RANDOM  Final   Special Requests NONE  Final   Culture (A)  Final    50,000 COLONIES/mL LACTOBACILLUS SPECIES Standardized susceptibility testing for this organism is not available.    Report Status 09/28/2016 FINAL  Final  Respiratory Panel by PCR     Status: Abnormal   Collection Time: 09/27/16  6:03 AM  Result Value Ref Range Status   Adenovirus NOT DETECTED NOT DETECTED Final   Coronavirus 229E NOT DETECTED NOT DETECTED Final   Coronavirus HKU1 NOT DETECTED NOT DETECTED Final   Coronavirus NL63 NOT DETECTED NOT DETECTED Final   Coronavirus OC43 NOT DETECTED NOT DETECTED Final   Metapneumovirus NOT DETECTED NOT DETECTED Final   Rhinovirus / Enterovirus DETECTED (A) NOT DETECTED Final   Influenza A NOT DETECTED NOT DETECTED Final   Influenza B NOT DETECTED NOT DETECTED Final   Parainfluenza Virus 1 NOT DETECTED NOT DETECTED Final   Parainfluenza Virus 2 NOT DETECTED NOT DETECTED Final   Parainfluenza  Virus 3 NOT DETECTED NOT DETECTED Final   Parainfluenza Virus 4 NOT DETECTED NOT DETECTED Final   Respiratory Syncytial Virus NOT DETECTED NOT DETECTED Final   Bordetella pertussis NOT DETECTED NOT DETECTED Final   Chlamydophila pneumoniae NOT DETECTED NOT DETECTED Final   Mycoplasma pneumoniae NOT DETECTED NOT DETECTED Final  MRSA PCR Screening     Status: None   Collection Time: 09/27/16  6:03 AM  Result Value Ref Range Status   MRSA by PCR NEGATIVE NEGATIVE Final    Comment:        The GeneXpert MRSA Assay (FDA approved for NASAL specimens only), is one component of a comprehensive MRSA colonization surveillance program. It is not intended to diagnose MRSA infection nor to guide or monitor treatment for MRSA infections.   Culture, blood (routine x 2)     Status: None (Preliminary result)   Collection Time: 09/29/16 11:38 AM  Result Value Ref Range Status   Specimen Description BLOOD LEFT ARM  Final   Special Requests   Final    BOTTLES DRAWN AEROBIC ONLY Blood Culture adequate volume   Culture   Final    NO GROWTH 4 DAYS Performed at [facility], [address] [place], [place] [address]    Report Status PENDING  Incomplete  Culture, blood (routine x 2)     Status: None (Preliminary result)   Collection Time: 09/29/16 11:38 AM  Result Value Ref Range Status   Specimen Description BLOOD RIGHT HAND  Final   Special Requests IN PEDIATRIC BOTTLE Blood Culture adequate volume  Final   Culture   Final    NO GROWTH 4 DAYS Performed at [facility], [address],  [place], [place] [address]    Report Status PENDING  Incomplete  Aerobic/Anaerobic Culture (surgical/deep wound)     Status: None (Preliminary result)   Collection Time: 09/30/16  9:10 PM  Result Value Ref Range Status   Specimen Description ABSCESS LEFT WRIST/HAND  Final   Special Requests NONE  Final   Gram Stain   Final    RARE WBC PRESENT, PREDOMINANTLY PMN RARE GRAM POSITIVE COCCI IN  PAIRS Performed at [facility], [address], [place], [place] [address]    Culture   Final    MODERATE STAPHYLOCOCCUS AUREUS NO ANAEROBES ISOLATED; CULTURE IN PROGRESS FOR 5 DAYS    Report Status PENDING  Incomplete   Organism ID, Bacteria STAPHYLOCOCCUS AUREUS  Final      Susceptibility   Staphylococcus aureus - MIC*    CIPROFLOXACIN <=0.5 SENSITIVE Sensitive     ERYTHROMYCIN >=8 RESISTANT Resistant     GENTAMICIN <=0.5 SENSITIVE Sensitive     OXACILLIN 0.5 SENSITIVE Sensitive     TETRACYCLINE <=1 SENSITIVE Sensitive     VANCOMYCIN <=0.5 SENSITIVE Sensitive     TRIMETH/SULFA <=10 SENSITIVE Sensitive     CLINDAMYCIN <=0.25 SENSITIVE Sensitive     RIFAMPIN <=0.5 SENSITIVE Sensitive     Inducible Clindamycin NEGATIVE Sensitive     * MODERATE STAPHYLOCOCCUS AUREUS         Radiology Studies: No results found.    Scheduled Meds: . abacavir-dolutegravir-lamiVUDine  1 tablet Oral Daily  . amLODipine  5 mg Oral Daily  . darunavir-cobicistat  1 tablet Oral Q breakfast  . enoxaparin (LOVENOX) injection  40 mg Subcutaneous Q24H  . ketorolac  30 mg Intravenous Q6H  . loratadine  10 mg Oral Daily  . traZODone  100 mg Oral QHS   Continuous Infusions: .  ceFAZolin (ANCEF) IV 1 g (10/03/16 1433)     LOS: 4 days    Time spent in minutes: 35    Calvert Cantor, MD [facility] Pager: [email or web address] Password [facility] 10/03/2016, 2:47 PM

## 2016-10-03 NOTE — Progress Notes (Signed)
PT HYDROTHERAPY TREATMENT PLAN  10/03/16 1400  Subjective Assessment  Subjective I am down  Patient and Family Stated Goals wound healed  Prior Treatments I and D  Wound / Incision (Open or Dehisced) 10/02/16 Incision - Open Hand Left open incision L dorsum of hand and thenar eminence  Date First Assessed/Time First Assessed: 10/02/16 1210   Wound Type: Incision - Open  Location: Hand  Location Orientation: Left  Wound Description (Comments): open incision L dorsum of hand and thenar eminence  Present on Admission: (c) No  Dressing Type Gauze (Comment);Moist to dry  Dressing Change Frequency Daily  Site / Wound Assessment Granulation tissue  % Wound base Red or Granulating 95%  % Wound base Yellow/Fibrinous Exudate 5%  Peri-wound Assessment Intact;Edema  Wound Length (cm) (see eval)  Margins Unattached edges (unapproximated)  Closure None  Drainage Amount Minimal  Drainage Description Serosanguineous  Treatment Cleansed;Hydrotherapy (Pulse lavage);Packing (Saline gauze)  Hydrotherapy  Pulsed lavage therapy - wound location L hand   Pulsed Lavage with Suction (psi) 4 psi  Pulsed Lavage with Suction - Normal Saline Used 1000 mL  Pulsed Lavage Tip Tip with splash shield  Wound Therapy - Assess/Plan/Recommendations  Wound Therapy - Clinical Statement pt will benefit from hydrotherapy for cleansing, debridement and pt education for  L hand wound  Wound Therapy - Functional Problem List decr functional use L hand   Factors Delaying/Impairing Wound Healing Infection - systemic/local;Substance abuse  Hydrotherapy Plan Debridement;Dressing change;Patient/family education;Pulsatile lavage with suction  Wound Therapy - Frequency 6X / week  Wound Therapy - Follow Up Recommendations Home health RN  Wound Plan continue current POC;  pt may D/C tomorrow; wound should contineu to be packed after D/C, pt educated; significantly decr edema today, mild induration present peri-wound area  Wound Therapy  Goals - Improve the function of patient's integumentary system by progressing the wound(s) through the phases of wound healing by:  Decrease Necrotic Tissue to 0  Decrease Necrotic Tissue - Progress Updated due to goal met  Increase Granulation Tissue to 100  Increase Granulation Tissue - Progress Updated due to goal met  Goals/treatment plan/discharge plan were made with and agreed upon by patient/family Yes  Time For Goal Achievement 7 days  Wound Therapy - Potential for Goals Good

## 2016-10-03 NOTE — Progress Notes (Addendum)
Patient ID: Courtney Ellis, female   DOB: 12/12/1960, 56 y.o.   MRN: [id number] Patient seen and examined.  Patient is neurovascularly intact.  Status post irrigation debridement left hand.  I discussed with patient that at present time we'll continue in-house whirlpools until she is able to perform wet-to-dry dressing changes with gauze on her own accord.  I would like see her 7 days after discharge.  Antibiotic recommendations have been reviewed.  I agree with 6 weeks of antibiotics however hike where he whether this is truly an osteomyelitic focus given the fact that this was an needle injection and there was no penetration into the bone that I am aware of. Nevertheless a follow-up exam and serial radiographs as well as all up MRI would be helpful given her immunocompromised state  Hopefully she can transition to by mouth antibiotics same.  She is anxious to go home. I discussed her the necessity of meticulous wound care.  She understands addition don'ts.  I discussed her that I would certainly recommend changing her lifestyle and she understands this. 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  All questions have been encouraged and answered.  Should and problems arise a B minute with able.  I'll be out of town this weekend. My partners will be available for any emergencies (Dr. Orlan Leavensrtman).  Amea Mcphail M.D. cell phone [phone]

## 2016-10-04 DIAGNOSIS — F1110 Opioid abuse, uncomplicated: Secondary | ICD-10-CM

## 2016-10-04 DIAGNOSIS — L02512 Cutaneous abscess of left hand: Secondary | ICD-10-CM

## 2016-10-04 LAB — CULTURE, BLOOD (ROUTINE X 2)
Culture: NO GROWTH
Culture: NO GROWTH
SPECIAL REQUESTS: ADEQUATE
Special Requests: ADEQUATE

## 2016-10-04 LAB — GLUCOSE, CAPILLARY: GLUCOSE-CAPILLARY: 121 mg/dL — AB (ref 65–99)

## 2016-10-04 MED ORDER — OXYCODONE HCL 5 MG PO TABS: 5.0000 mg | ORAL_TABLET | Freq: Four times a day (QID) | ORAL | 0 refills | Status: DC | PRN

## 2016-10-04 MED ORDER — CEPHALEXIN 500 MG PO CAPS: 500.0000 mg | ORAL_CAPSULE | Freq: Four times a day (QID) | ORAL | 0 refills | Status: AC

## 2016-10-04 MED ORDER — CEPHALEXIN 500 MG PO CAPS: 500.0000 mg | ORAL_CAPSULE | Freq: Four times a day (QID) | ORAL | 0 refills | Status: DC

## 2016-10-04 NOTE — Discharge Summary (Signed)
Physician Discharge Summary  DEJANE SCHEIBE ZOX:[id number] DOB: 1961/02/19 DOA: 09/29/2016  PCP: Adah Salvage, NP  Admit date: 09/29/2016 Discharge date: 10/04/2016  Admitted From: home  Disposition:  home   Home Health:  RN and aid   Discharge Condition:  stable   CODE STATUS:  Full code   Consultations:  Phone discussion with ID  Hand surgery- Dr Amanda Pea    Discharge Diagnoses:  Principal Problem:   Abscess of left hand Active Problems:   Leukocytosis   Heroin abuse   Human immunodeficiency virus (HIV) disease (HCC)   Chronic hepatitis C without hepatic coma (HCC)   Essential hypertension    Subjective: No complaints today.   Brief Summary: 56 yo female with HIV on ART, hep C, heroin use recently hospitalized at [facility] for sepsis due to rhinovirus and left arm cellulitis related to a heroin injection but left AMA on 6/10. She returns for increased swelling of the hand and arm , fevers and chills.  Hospital Course:  Principal Problem:   Left hand abscess  relation to Heroin injection  Sepsis - MRI suggestive of cellulitis, abscess, myositis and possibly osteomyelitis -see report below - s/p I and D by Dr Amanda Pea on 6/12 -   cultures reveal MSSA-  - 6/15 switched to Ancef- have spoken with ID in regards to treatment of Osteomyelitis with IV antibiotics vs Oral. Dr Orvan Falconer has recommended 6 wks of oral antibiotics.  - have given a 6 wk prescription for Keflex today and 30 tabs of Oxycodone (5mg ) - have asked her to f/u with Dr Luciana Axe whom she sees for HIV     Active Problems:  HIV - CD 4 count 350 on 2/18- cont Triumeq  - repeat CD 4 count is still pending    Chronic hepatitis C without hepatic coma  - outpt f/u    Essential hypertension - Norvasc    Substance abuse with Heroin   - have counseled to stop   Discharge Instructions  Discharge Instructions    Diet - low sodium heart healthy    Complete by:  As directed    Increase activity slowly     Complete by:  As directed      Allergies as of 10/04/2016   No Known Allergies     Medication List    TAKE these medications   amLODipine 5 MG tablet Commonly known as:  NORVASC TAKE 1 TABLET (5 MG TOTAL) BY MOUTH DAILY.   cephALEXin 500 MG capsule Commonly known as:  KEFLEX Take 1 capsule (500 mg total) by mouth 4 (four) times daily.   cetirizine 10 MG tablet Commonly known as:  ZYRTEC Take 1 tablet (10 mg total) by mouth daily.   diazepam 5 MG tablet Commonly known as:  VALIUM Take 1 tablet (5 mg total) by mouth at bedtime as needed for anxiety.   oxyCODONE 5 MG immediate release tablet Commonly known as:  Oxy IR/ROXICODONE Take 1-2 tablets (5-10 mg total) by mouth every 6 (six) hours as needed for moderate pain or severe pain (1 tab for moderate pain and 2 tabs for severe pain).   PREZCOBIX 800-150 MG tablet Generic drug:  darunavir-cobicistat TAKE 1 TABLET BY MOUTH DAILY. SWALLOW WHOLE. DO NOT CRUSH, BREAK OR CHEW TABLETS. TAKE WITH FOOD.   PROVENTIL HFA 108 (90 Base) MCG/ACT inhaler Generic drug:  albuterol INHALE TWO PUFFS INTO LUNGS EVERY SIX HOURS AS NEEDED FOR WHEEZING OR SHORTNESS OF BREATH   traZODone 100 MG tablet Commonly  known as:  DESYREL Take 1 tablet (100 mg total) by mouth at bedtime. What changed:  when to take this  reasons to take this   TRIUMEQ 600-50-300 MG tablet Generic drug:  abacavir-dolutegravir-lamiVUDine TAKE 1 TABLET BY MOUTH DAILY.      Follow-up Information    Culmer, Dawn C, NP Follow up.   Specialty:  Endocrinology       Comer, Belia Heman, MD Follow up in 1 week(s).   Specialty:  Infectious Diseases Contact information: [address] [place] [address] [phone]        [facility]              Follow up.   Why:  please call to arrange follow up appointment Contact information: [address] [place] [address] [phone]          No Known Allergies   Procedures/Studies:    Dg Chest 2 View  Result Date: 09/27/2016 CLINICAL DATA:  Cough and congestion. EXAM: CHEST  2 VIEW COMPARISON:  10/03/2010 FINDINGS: The cardiomediastinal contours are normal. Chronic borderline hyperinflation and bronchial thickening. Pulmonary vasculature is normal. No consolidation, pleural effusion, or pneumothorax. No acute osseous abnormalities are seen. IMPRESSION: Chronic mild bronchial thickening and borderline hyperinflation. No acute abnormality. Electronically Signed   By: Rubye Oaks M.D.   On: 09/27/2016 02:20   Mr Hand Left W Wo Contrast  Result Date: 09/29/2016 CLINICAL DATA:  Diffuse soft tissue swelling/ edema and erythema involving the hand and wrist. EXAM: MRI OF THE LEFT HAND WITHOUT AND WITH CONTRAST; MRI OF THE LEFT WRIST WITHOUT AND WITH CONTRAST TECHNIQUE: Multiplanar, multisequence MR imaging of the left hand and wrist was performed before and after the administration of intravenous contrast. CONTRAST:  14mL MULTIHANCE GADOBENATE DIMEGLUMINE 529 MG/ML IV SOLN COMPARISON:  None. FINDINGS: There is severe diffuse subcutaneous soft tissue swelling/ edema/fluid most notably involving the radial aspect of hand and wrist. Findings consistent with severe cellulitis. There is also the soft tissue abscess in the webspace between the thumb and second metacarpal. This measures 13 x 11 mm and is surrounded by a thick rim enhancing soft tissue/inflammatory phlegmon. Slightly more proximally there is a cluster of microabscesses located more superficially. There is also significant myositis involving the thenar musculature. No definite findings for pyomyositis. Signal abnormality in the first metacarpal is worrisome for osteomyelitis. No definite MR findings for [facility] septic arthritis. IMPRESSION: Diffuse and severe cellulitis involving the hand and wrist. Soft tissue abscess in the webspace between the thumb and second metacarpal and also in  the subcutaneous tissues more proximally. Extensive myositis involving the thenar musculature without definite pyomyositis. Acute versus chronic osteomyelitis involving the first metacarpal. These results will be called to the ordering clinician or representative by the Radiologist Assistant, and communication documented in the PACS or zVision Dashboard. Electronically Signed   By: Rudie Meyer M.D.   On: 09/29/2016 17:10   Mr Wrist Left W Wo Contrast  Result Date: 09/29/2016 CLINICAL DATA:  Diffuse soft tissue swelling/ edema and erythema involving the hand and wrist. EXAM: MRI OF THE LEFT HAND WITHOUT AND WITH CONTRAST; MRI OF THE LEFT WRIST WITHOUT AND WITH CONTRAST TECHNIQUE: Multiplanar, multisequence MR imaging of the left hand and wrist was performed before and after the administration of intravenous contrast. CONTRAST:  14mL MULTIHANCE GADOBENATE DIMEGLUMINE 529 MG/ML IV SOLN COMPARISON:  None. FINDINGS: There is severe diffuse subcutaneous soft tissue swelling/ edema/fluid most notably  involving the radial aspect of hand and wrist. Findings consistent with severe cellulitis. There is also the soft tissue abscess in the webspace between the thumb and second metacarpal. This measures 13 x 11 mm and is surrounded by a thick rim enhancing soft tissue/inflammatory phlegmon. Slightly more proximally there is a cluster of microabscesses located more superficially. There is also significant myositis involving the thenar musculature. No definite findings for pyomyositis. Signal abnormality in the first metacarpal is worrisome for osteomyelitis. No definite MR findings for [facility] septic arthritis. IMPRESSION: Diffuse and severe cellulitis involving the hand and wrist. Soft tissue abscess in the webspace between the thumb and second metacarpal and also in the subcutaneous tissues more proximally. Extensive myositis involving the thenar musculature without definite pyomyositis. Acute versus chronic osteomyelitis  involving the first metacarpal. These results will be called to the ordering clinician or representative by the Radiologist Assistant, and communication documented in the PACS or zVision Dashboard. Electronically Signed   By: Rudie Meyer M.D.   On: 09/29/2016 17:10   Dg Hand Complete Left  Result Date: 09/27/2016 CLINICAL DATA:  Left hand pain and swelling for 3 days. IV drug use. EXAM: LEFT HAND - COMPLETE 3+ VIEW COMPARISON:  None. FINDINGS: There is no evidence of fracture or dislocation. There is no evidence of arthropathy or other focal bone abnormality. Diffuse soft tissue edema of the hand. No radiopaque foreign body no tracking soft tissue air. IMPRESSION: Diffuse soft tissue edema. No osseous abnormality or radiopaque foreign body. Electronically Signed   By: Rubye Oaks M.D.   On: 09/27/2016 03:46       Discharge Exam: Vitals:   10/03/16 2127 10/04/16 0539  BP: 140/88 140/76  Pulse: 65 65  Resp: 18 16  Temp: 97.8 F (36.6 C) 97.8 F (36.6 C)   Vitals:   10/03/16 0621 10/03/16 1504 10/03/16 2127 10/04/16 0539  BP: (!) 141/95 132/87 140/88 140/76  Pulse: 66 69 65 65  Resp: 18 18 18 16   Temp: 98 F (36.7 C) 98 F (36.7 C) 97.8 F (36.6 C) 97.8 F (36.6 C)  TempSrc: Oral Oral Oral Oral  SpO2: 100% 100% 97% 100%  Weight:      Height:        General: Pt is alert, awake, not in acute distress Cardiovascular: RRR, S1/S2 +, no rubs, no gallops Respiratory: CTA bilaterally, no wheezing, no rhonchi Abdominal: Soft, NT, ND, bowel sounds + Extremities: no edema, no cyanosis    The results of significant diagnostics from this hospitalization (including imaging, microbiology, ancillary and laboratory) are listed below for reference.     Microbiology: Recent Results (from the past 240 hour(s))  Culture, blood (Routine x 2)     Status: None   Collection Time: 09/27/16  1:20 AM  Result Value Ref Range Status   Specimen Description BLOOD RIGHT ARM  Final   Special  Requests IN PEDIATRIC BOTTLE Blood Culture adequate volume  Final   Culture NO GROWTH 5 DAYS  Final   Report Status 10/02/2016 FINAL  Final  Culture, blood (Routine x 2)     Status: None   Collection Time: 09/27/16  1:39 AM  Result Value Ref Range Status   Specimen Description BLOOD RIGHT HAND  Final   Special Requests   Final    BOTTLES DRAWN AEROBIC AND ANAEROBIC Blood Culture adequate volume   Culture NO GROWTH 5 DAYS  Final   Report Status 10/02/2016 FINAL  Final  Urine culture  Status: Abnormal   Collection Time: 09/27/16  2:35 AM  Result Value Ref Range Status   Specimen Description URINE, RANDOM  Final   Special Requests NONE  Final   Culture (A)  Final    50,000 COLONIES/mL LACTOBACILLUS SPECIES Standardized susceptibility testing for this organism is not available.    Report Status 09/28/2016 FINAL  Final  Respiratory Panel by PCR     Status: Abnormal   Collection Time: 09/27/16  6:03 AM  Result Value Ref Range Status   Adenovirus NOT DETECTED NOT DETECTED Final   Coronavirus 229E NOT DETECTED NOT DETECTED Final   Coronavirus HKU1 NOT DETECTED NOT DETECTED Final   Coronavirus NL63 NOT DETECTED NOT DETECTED Final   Coronavirus OC43 NOT DETECTED NOT DETECTED Final   Metapneumovirus NOT DETECTED NOT DETECTED Final   Rhinovirus / Enterovirus DETECTED (A) NOT DETECTED Final   Influenza A NOT DETECTED NOT DETECTED Final   Influenza B NOT DETECTED NOT DETECTED Final   Parainfluenza Virus 1 NOT DETECTED NOT DETECTED Final   Parainfluenza Virus 2 NOT DETECTED NOT DETECTED Final   Parainfluenza Virus 3 NOT DETECTED NOT DETECTED Final   Parainfluenza Virus 4 NOT DETECTED NOT DETECTED Final   Respiratory Syncytial Virus NOT DETECTED NOT DETECTED Final   Bordetella pertussis NOT DETECTED NOT DETECTED Final   Chlamydophila pneumoniae NOT DETECTED NOT DETECTED Final   Mycoplasma pneumoniae NOT DETECTED NOT DETECTED Final  MRSA PCR Screening     Status: None   Collection Time:  09/27/16  6:03 AM  Result Value Ref Range Status   MRSA by PCR NEGATIVE NEGATIVE Final    Comment:        The GeneXpert MRSA Assay (FDA approved for NASAL specimens only), is one component of a comprehensive MRSA colonization surveillance program. It is not intended to diagnose MRSA infection nor to guide or monitor treatment for MRSA infections.   Culture, blood (routine x 2)     Status: None   Collection Time: 09/29/16 11:38 AM  Result Value Ref Range Status   Specimen Description BLOOD LEFT ARM  Final   Special Requests   Final    BOTTLES DRAWN AEROBIC ONLY Blood Culture adequate volume   Culture   Final    NO GROWTH 5 DAYS Performed at [facility], [address] [place], [place] [address]    Report Status 10/04/2016 FINAL  Final  Culture, blood (routine x 2)     Status: None   Collection Time: 09/29/16 11:38 AM  Result Value Ref Range Status   Specimen Description BLOOD RIGHT HAND  Final   Special Requests IN PEDIATRIC BOTTLE Blood Culture adequate volume  Final   Culture   Final    NO GROWTH 5 DAYS Performed at [facility], [address], [place], [place] [address]    Report Status 10/04/2016 FINAL  Final  Aerobic/Anaerobic Culture (surgical/deep wound)     Status: None (Preliminary result)   Collection Time: 09/30/16  9:10 PM  Result Value Ref Range Status   Specimen Description ABSCESS LEFT WRIST/HAND  Final   Special Requests NONE  Final   Gram Stain   Final    RARE WBC PRESENT, PREDOMINANTLY PMN RARE GRAM POSITIVE COCCI IN PAIRS Performed at [facility], [address] [place], [place] [address]    Culture   Final    MODERATE STAPHYLOCOCCUS AUREUS NO ANAEROBES ISOLATED; CULTURE IN PROGRESS FOR 5 DAYS    Report Status PENDING  Incomplete   Organism ID, Bacteria STAPHYLOCOCCUS AUREUS  Final      Susceptibility   Staphylococcus aureus - MIC*    CIPROFLOXACIN <=0.5 SENSITIVE Sensitive     ERYTHROMYCIN >=8 RESISTANT Resistant      GENTAMICIN <=0.5 SENSITIVE Sensitive     OXACILLIN 0.5 SENSITIVE Sensitive     TETRACYCLINE <=1 SENSITIVE Sensitive     VANCOMYCIN <=0.5 SENSITIVE Sensitive     TRIMETH/SULFA <=10 SENSITIVE Sensitive     CLINDAMYCIN <=0.25 SENSITIVE Sensitive     RIFAMPIN <=0.5 SENSITIVE Sensitive     Inducible Clindamycin NEGATIVE Sensitive     * MODERATE STAPHYLOCOCCUS AUREUS     Labs: BNP (last 3 results) No results for input(s): BNP in the last 8760 hours. Basic Metabolic Panel:  Recent Labs Lab 09/28/16 0539 09/29/16 1155 09/30/16 0430 10/01/16 0349  NA 138 132* 136 137  K 3.8 3.9 3.3* 4.2  CL 106 99* 102 107  CO2 25 25 25 24   GLUCOSE 118* 101* 94 117*  BUN <5* 7 7 9   CREATININE 0.69 0.74 0.73 0.74  CALCIUM 8.7* 8.6* 8.4* 8.7*  MG  --  1.4*  --  1.6*  PHOS  --  3.1  --   --    Liver Function Tests:  Recent Labs Lab 09/29/16 1155  AST 35  ALT 20  ALKPHOS 72  BILITOT 1.2  PROT 7.3  ALBUMIN 3.0*   No results for input(s): LIPASE, AMYLASE in the last 168 hours. No results for input(s): AMMONIA in the last 168 hours. CBC:  Recent Labs Lab 09/28/16 0539 09/30/16 0430 10/01/16 0349  WBC 8.8 7.8 7.0  HGB 11.3* 10.7* 10.6*  HCT 34.4* 31.8* 31.4*  MCV 89.1 88.6 87.2  PLT 288 256 259   Cardiac Enzymes: No results for input(s): CKTOTAL, CKMB, CKMBINDEX, TROPONINI in the last 168 hours. BNP: Invalid input(s): POCBNP CBG:  Recent Labs Lab 09/30/16 2235 10/01/16 0753 10/01/16 0931 10/02/16 0810 10/04/16 0809  GLUCAP 124* 355* 122* 106* 121*   D-Dimer No results for input(s): DDIMER in the last 72 hours. Hgb A1c No results for input(s): HGBA1C in the last 72 hours. Lipid Profile No results for input(s): CHOL, HDL, LDLCALC, TRIG, CHOLHDL, LDLDIRECT in the last 72 hours. Thyroid function studies No results for input(s): TSH, T4TOTAL, T3FREE, THYROIDAB in the last 72 hours.  Invalid input(s): FREET3 Anemia work up No results for input(s): VITAMINB12, FOLATE,  FERRITIN, TIBC, IRON, RETICCTPCT in the last 72 hours. Urinalysis    Component Value Date/Time   COLORURINE AMBER (A) 09/27/2016 0140   APPEARANCEUR HAZY (A) 09/27/2016 0140   LABSPEC 1.026 09/27/2016 0140   PHURINE 5.0 09/27/2016 0140   GLUCOSEU NEGATIVE 09/27/2016 0140   GLUCOSEU NEG mg/dL 09/81/1914 7829   HGBUR SMALL (A) 09/27/2016 0140   BILIRUBINUR SMALL (A) 09/27/2016 0140   KETONESUR NEGATIVE 09/27/2016 0140   PROTEINUR >=300 (A) 09/27/2016 0140   UROBILINOGEN 0.2 02/22/2014 1200   NITRITE NEGATIVE 09/27/2016 0140   LEUKOCYTESUR NEGATIVE 09/27/2016 0140   Sepsis Labs Invalid input(s): PROCALCITONIN,  WBC,  LACTICIDVEN Microbiology Recent Results (from the past 240 hour(s))  Culture, blood (Routine x 2)     Status: None   Collection Time: 09/27/16  1:20 AM  Result Value Ref Range Status   Specimen Description BLOOD RIGHT ARM  Final   Special Requests IN PEDIATRIC BOTTLE Blood Culture adequate volume  Final   Culture NO GROWTH 5 DAYS  Final   Report Status 10/02/2016 FINAL  Final  Culture, blood (Routine x 2)     Status: None   Collection Time: 09/27/16  1:39 AM  Result Value Ref Range Status   Specimen Description BLOOD RIGHT HAND  Final   Special Requests   Final    BOTTLES DRAWN AEROBIC AND ANAEROBIC Blood Culture adequate volume   Culture NO GROWTH 5 DAYS  Final   Report Status 10/02/2016 FINAL  Final  Urine culture     Status: Abnormal   Collection Time: 09/27/16  2:35 AM  Result Value Ref Range Status   Specimen Description URINE, RANDOM  Final   Special Requests NONE  Final   Culture (A)  Final    50,000 COLONIES/mL LACTOBACILLUS SPECIES Standardized susceptibility testing for this organism is not available.    Report Status 09/28/2016 FINAL  Final  Respiratory Panel by PCR     Status: Abnormal   Collection Time: 09/27/16  6:03 AM  Result Value Ref Range Status   Adenovirus NOT DETECTED NOT DETECTED Final   Coronavirus 229E NOT DETECTED NOT DETECTED  Final   Coronavirus HKU1 NOT DETECTED NOT DETECTED Final   Coronavirus NL63 NOT DETECTED NOT DETECTED Final   Coronavirus OC43 NOT DETECTED NOT DETECTED Final   Metapneumovirus NOT DETECTED NOT DETECTED Final   Rhinovirus / Enterovirus DETECTED (A) NOT DETECTED Final   Influenza A NOT DETECTED NOT DETECTED Final   Influenza B NOT DETECTED NOT DETECTED Final   Parainfluenza Virus 1 NOT DETECTED NOT DETECTED Final   Parainfluenza Virus 2 NOT DETECTED NOT DETECTED Final   Parainfluenza Virus 3 NOT DETECTED NOT DETECTED Final   Parainfluenza Virus 4 NOT DETECTED NOT DETECTED Final   Respiratory Syncytial Virus NOT DETECTED NOT DETECTED Final   Bordetella pertussis NOT DETECTED NOT DETECTED Final   Chlamydophila pneumoniae NOT DETECTED NOT DETECTED Final   Mycoplasma pneumoniae NOT DETECTED NOT DETECTED Final  MRSA PCR Screening     Status: None   Collection Time: 09/27/16  6:03 AM  Result Value Ref Range Status   MRSA by PCR NEGATIVE NEGATIVE Final    Comment:        The GeneXpert MRSA Assay (FDA approved for NASAL specimens only), is one component of a comprehensive MRSA colonization surveillance program. It is not intended to diagnose MRSA infection nor to guide or monitor treatment for MRSA infections.   Culture, blood (routine x 2)     Status: None   Collection Time: 09/29/16 11:38 AM  Result Value Ref Range Status   Specimen Description BLOOD LEFT ARM  Final   Special Requests   Final    BOTTLES DRAWN AEROBIC ONLY Blood Culture adequate volume   Culture   Final    NO GROWTH 5 DAYS Performed at [facility], [address] [place], [place] [address]    Report Status 10/04/2016 FINAL  Final  Culture, blood (routine x 2)     Status: None   Collection Time: 09/29/16 11:38 AM  Result Value Ref Range Status   Specimen Description BLOOD RIGHT HAND  Final   Special Requests IN PEDIATRIC BOTTLE Blood Culture adequate volume  Final   Culture   Final    NO GROWTH 5  DAYS Performed at [facility], [address], [place], [place] [address]    Report Status 10/04/2016 FINAL  Final  Aerobic/Anaerobic Culture (surgical/deep wound)     Status: None (Preliminary result)   Collection Time: 09/30/16  9:10 PM  Result Value Ref  Range Status   Specimen Description ABSCESS LEFT WRIST/HAND  Final   Special Requests NONE  Final   Gram Stain   Final    RARE WBC PRESENT, PREDOMINANTLY PMN RARE GRAM POSITIVE COCCI IN PAIRS Performed at [facility], [address] [place], [place] [id number]    Culture   Final    MODERATE STAPHYLOCOCCUS AUREUS NO ANAEROBES ISOLATED; CULTURE IN PROGRESS FOR 5 DAYS    Report Status PENDING  Incomplete   Organism ID, Bacteria STAPHYLOCOCCUS AUREUS  Final      Susceptibility   Staphylococcus aureus - MIC*    CIPROFLOXACIN <=0.5 SENSITIVE Sensitive     ERYTHROMYCIN >=8 RESISTANT Resistant     GENTAMICIN <=0.5 SENSITIVE Sensitive     OXACILLIN 0.5 SENSITIVE Sensitive     TETRACYCLINE <=1 SENSITIVE Sensitive     VANCOMYCIN <=0.5 SENSITIVE Sensitive     TRIMETH/SULFA <=10 SENSITIVE Sensitive     CLINDAMYCIN <=0.25 SENSITIVE Sensitive     RIFAMPIN <=0.5 SENSITIVE Sensitive     Inducible Clindamycin NEGATIVE Sensitive     * MODERATE STAPHYLOCOCCUS AUREUS     Time coordinating discharge: Over 30 minutes  SIGNED:   Calvert CantorSaima Jeriah Corkum, MD  [facility] 10/04/2016, 1:41 PM Pager   If 7PM-7AM, please contact night-coverage [email or web address] Password [email or web address]

## 2016-10-04 NOTE — Discharge Instructions (Signed)

## 2016-10-04 NOTE — Care Management Important Message (Deleted)
Important Message  Patient Details  Name: Courtney Ellis Delaine MRN: [id number] Date of Birth: 12/19/1960   Medicare Important Message Given:  Yes    Elliot CousinShavis, Mesha Schamberger Ellen, RN 10/04/2016, 11:10 AM

## 2016-10-04 NOTE — Care Management Note (Signed)
Case Management Note  Patient Details  Name: Courtney Ellis MRN: [id number] Date of Birth: 10/27/1960  Subjective/Objective:   Left hand/arm cellulitis and abscess at Heroin injection site, HIV, Chronic Hep C, HTN                 Action/Plan: Discharge Planning: NCM spoke to pt and offered choice for HH/list provided. [facility]. Pt states son and son's girlfriend will be at home to assist with care. Contacted [facility] rep with new referral.   PCP Vernelle EmeraldULMER, DAWN C MD  Expected Discharge Date:  10/04/16               Expected Discharge Plan:  Home w Home Health Services  In-House Referral:     Discharge planning Services  CM Consult  Post Acute Care Choice:  Home Health Choice offered to:  Patient  DME Arranged:  Bedside commode, N/A DME Agency:  NA  HH Arranged:  RN HH Agency:  [facility]  Status of Service:  Completed, signed off  If discussed at Long Length of Stay Meetings, dates discussed:    Additional Comments:  Elliot CousinShavis, Courtney Bellizzi Ellen, RN 10/04/2016, 11:14 AM

## 2016-10-04 NOTE — Progress Notes (Signed)
PT HYDROTHERAPY NOTE  10/04/16 1100  Subjective Assessment  Subjective I am getting out of here today  Patient and Family Stated Goals wound healed  Prior Treatments I and D  Evaluation and Treatment  Evaluation and Treatment Procedures Explained to Patient/Family Yes  Evaluation and Treatment Procedures agreed to  Wound / Incision (Open or Dehisced) 10/02/16 Incision - Open Hand Left open incision L dorsum of hand and thenar eminence  Date First Assessed/Time First Assessed: 10/02/16 1210   Wound Type: Incision - Open  Location: Hand  Location Orientation: Left  Wound Description (Comments): open incision L dorsum of hand and thenar eminence  Present on Admission: (c) No  Dressing Type Gauze (Comment);Moist to dry  Dressing Status Old drainage;Intact  Dressing Change Frequency Daily  Site / Wound Assessment Granulation tissue  % Wound base Red or Granulating 95%  % Wound base Yellow/Fibrinous Exudate 5%  Peri-wound Assessment Intact;Induration (mild induration, improving)  Margins Epibole (rolled edges) (appears begininng epibole, dorsum wound)  Closure None  Drainage Amount Minimal  Drainage Description Serosanguineous  Treatment Debridement (Selective);Cleansed;Hydrotherapy (Pulse lavage);Packing (Saline gauze)  Hydrotherapy  Pulsed lavage therapy - wound location L hand   Pulsed Lavage with Suction (psi) 8 psi  Pulsed Lavage with Suction - Normal Saline Used 1000 mL  Pulsed Lavage Tip Tip with splash shield  Selective Debridement  Selective Debridement - Location wound  dorsum L hand   Selective Debridement - Tools Used Forceps  Selective Debridement - Tissue Removed fibrinous slough  Wound Therapy - Assess/Plan/Recommendations  Wound Therapy - Clinical Statement pt will benefit from hydrotherapy for cleansing, debridement and pt education for  L hand wound; pt to D/C today per her report, have cautioned and educated pt regarding proper wound care adn not doing any activities  that may impede healing   Wound Therapy - Functional Problem List decr functional use L hand   Factors Delaying/Impairing Wound Healing Infection - systemic/local;Substance abuse  Hydrotherapy Plan Debridement;Dressing change;Patient/family education;Pulsatile lavage with suction  Wound Therapy - Frequency 6X / week  Wound Therapy - Follow Up Recommendations Home health RN  Wound Plan wound should contineu to be packed after D/C, pt educated; significantly decr edema today, mild induration present peri-wound area--decreasing  Wound Therapy Goals - Improve the function of patient's integumentary system by progressing the wound(s) through the phases of wound healing by:  Decrease Necrotic Tissue to 0  Decrease Necrotic Tissue - Progress Progressing toward goal  Increase Granulation Tissue to 100  Increase Granulation Tissue - Progress Progressing toward goal  Goals/treatment plan/discharge plan were made with and agreed upon by patient/family Yes  Time For Goal Achievement 7 days  Wound Therapy - Potential for Goals Good  Drucilla Chaletara Darron Stuck, PT Pager: [phone] 10/04/2016

## 2016-10-04 NOTE — Progress Notes (Signed)
Patient discharged to home, all discharge medications and instructions reviewed and questions answered.  Patient to be assisted to vehicle by wheelchair.  

## 2016-10-06 ENCOUNTER — Telehealth: Payer: Self-pay | Admitting: *Deleted

## 2016-10-06 LAB — CD4/CD8 (T-HELPER/T-SUPPRESSOR CELL)
CD4 absolute: 320 /uL — ABNORMAL LOW (ref 500–1900)
CD4%: 15 % — AB (ref 30.0–60.0)
CD8 T Cell Abs: 1430 /uL — ABNORMAL HIGH (ref 230–1000)
CD8tox: 66 % — ABNORMAL HIGH (ref 15.0–40.0)
Ratio: 0.22 — ABNORMAL LOW (ref 1.0–3.0)
TOTAL LYMPHOCYTE COUNT: 2170 /uL (ref 1000–4000)

## 2016-10-06 LAB — AEROBIC/ANAEROBIC CULTURE W GRAM STAIN (SURGICAL/DEEP WOUND)

## 2016-10-06 LAB — AEROBIC/ANAEROBIC CULTURE (SURGICAL/DEEP WOUND)

## 2016-10-10 ENCOUNTER — Other Ambulatory Visit: Payer: Self-pay | Admitting: Internal Medicine

## 2016-10-10 DIAGNOSIS — J302 Other seasonal allergic rhinitis: Secondary | ICD-10-CM

## 2016-10-17 ENCOUNTER — Encounter (HOSPITAL_BASED_OUTPATIENT_CLINIC_OR_DEPARTMENT_OTHER): Payer: Medicaid Other | Attending: Internal Medicine

## 2016-10-17 DIAGNOSIS — L02512 Cutaneous abscess of left hand: Secondary | ICD-10-CM | POA: Diagnosis not present

## 2016-10-17 DIAGNOSIS — B20 Human immunodeficiency virus [HIV] disease: Secondary | ICD-10-CM | POA: Insufficient documentation

## 2016-10-17 DIAGNOSIS — B1920 Unspecified viral hepatitis C without hepatic coma: Secondary | ICD-10-CM | POA: Diagnosis not present

## 2016-10-17 DIAGNOSIS — F17210 Nicotine dependence, cigarettes, uncomplicated: Secondary | ICD-10-CM | POA: Diagnosis not present

## 2016-10-17 DIAGNOSIS — A4901 Methicillin susceptible Staphylococcus aureus infection, unspecified site: Secondary | ICD-10-CM | POA: Insufficient documentation

## 2016-10-17 DIAGNOSIS — M86642 Other chronic osteomyelitis, left hand: Secondary | ICD-10-CM | POA: Diagnosis not present

## 2016-10-17 DIAGNOSIS — I10 Essential (primary) hypertension: Secondary | ICD-10-CM | POA: Diagnosis not present

## 2016-10-17 DIAGNOSIS — T814XXA Infection following a procedure, initial encounter: Secondary | ICD-10-CM | POA: Diagnosis not present

## 2016-10-21 ENCOUNTER — Encounter: Payer: Self-pay | Admitting: Internal Medicine

## 2016-10-21 ENCOUNTER — Ambulatory Visit (INDEPENDENT_AMBULATORY_CARE_PROVIDER_SITE_OTHER): Payer: Medicaid Other | Admitting: Internal Medicine

## 2016-10-21 VITALS — BP 135/81 | HR 89 | Temp 98.2°F | Wt 142.0 lb

## 2016-10-21 DIAGNOSIS — Z79899 Other long term (current) drug therapy: Secondary | ICD-10-CM

## 2016-10-21 DIAGNOSIS — B20 Human immunodeficiency virus [HIV] disease: Secondary | ICD-10-CM | POA: Diagnosis not present

## 2016-10-21 DIAGNOSIS — Z113 Encounter for screening for infections with a predominantly sexual mode of transmission: Secondary | ICD-10-CM

## 2016-10-21 DIAGNOSIS — L02512 Cutaneous abscess of left hand: Secondary | ICD-10-CM | POA: Diagnosis not present

## 2016-10-21 DIAGNOSIS — F1110 Opioid abuse, uncomplicated: Secondary | ICD-10-CM | POA: Diagnosis not present

## 2016-10-21 DIAGNOSIS — B182 Chronic viral hepatitis C: Secondary | ICD-10-CM

## 2016-10-21 NOTE — Assessment & Plan Note (Signed)
If she remains drug free I can treat her after her next visit.

## 2016-10-21 NOTE — Assessment & Plan Note (Signed)
Improved.  She will continue her keflex

## 2016-10-21 NOTE — Assessment & Plan Note (Signed)
I will check her viral load today, CD4 was 320 recently.  rtc 5 months

## 2016-10-21 NOTE — Assessment & Plan Note (Signed)
Counselled.  Motivated to quit.

## 2016-10-21 NOTE — Progress Notes (Signed)
   Subjective:    Patient ID: Courtney Ellis, female    DOB: 02/04/1961, 56 y.o.   MRN: [id number]  HPI Here for follow up of her hand infection and HIV She was hospitalized with significant infection that required surgery done by Dr. Amanda PeaGramig and culture with MSSA.  She continues to take this 4 times a day.  No fever.  No associated rash, no n/v.  Hand is much improved, little pain now.  She continues to struggle with heroin use but only twice since discharge.  She has chronic hepatitis C.     Review of Systems  Constitutional: Negative for fatigue.  Gastrointestinal: Negative for diarrhea.  Skin: Negative for rash.  Neurological: Negative for dizziness.       Objective:   Physical Exam  Constitutional: She appears well-developed and well-nourished. No distress.  Eyes: No scleral icterus.  Cardiovascular: Normal rate, regular rhythm and normal heart sounds.   No murmur heard. Lymphadenopathy:    She has no cervical adenopathy.  Skin: No rash noted.   SH: less drug use, still heroin      Assessment & Plan:

## 2016-10-24 ENCOUNTER — Encounter (HOSPITAL_BASED_OUTPATIENT_CLINIC_OR_DEPARTMENT_OTHER): Payer: Medicaid Other | Attending: Internal Medicine

## 2016-10-24 DIAGNOSIS — Z21 Asymptomatic human immunodeficiency virus [HIV] infection status: Secondary | ICD-10-CM | POA: Insufficient documentation

## 2016-10-24 DIAGNOSIS — Z872 Personal history of diseases of the skin and subcutaneous tissue: Secondary | ICD-10-CM | POA: Insufficient documentation

## 2016-10-24 DIAGNOSIS — Z09 Encounter for follow-up examination after completed treatment for conditions other than malignant neoplasm: Secondary | ICD-10-CM | POA: Insufficient documentation

## 2016-10-24 DIAGNOSIS — I10 Essential (primary) hypertension: Secondary | ICD-10-CM | POA: Insufficient documentation

## 2016-10-24 LAB — HIV-1 RNA QUANT-NO REFLEX-BLD
HIV 1 RNA Quant: 54 copies/mL — ABNORMAL HIGH
HIV-1 RNA Quant, Log: 1.73 Log copies/mL — ABNORMAL HIGH

## 2016-11-03 ENCOUNTER — Other Ambulatory Visit: Payer: Self-pay | Admitting: *Deleted

## 2016-11-03 DIAGNOSIS — R0602 Shortness of breath: Secondary | ICD-10-CM

## 2016-11-03 DIAGNOSIS — B20 Human immunodeficiency virus [HIV] disease: Secondary | ICD-10-CM

## 2016-11-03 MED ORDER — DARUNAVIR-COBICISTAT 800-150 MG PO TABS: 1.0000 | ORAL_TABLET | Freq: Every day | ORAL | 4 refills | Status: DC

## 2016-11-03 MED ORDER — ALBUTEROL SULFATE HFA 108 (90 BASE) MCG/ACT IN AERS: INHALATION_SPRAY | RESPIRATORY_TRACT | 4 refills | Status: DC

## 2016-11-19 ENCOUNTER — Other Ambulatory Visit: Payer: Self-pay | Admitting: *Deleted

## 2016-11-19 MED ORDER — DIAZEPAM 5 MG PO TABS: 5.0000 mg | ORAL_TABLET | Freq: Every evening | ORAL | 1 refills | Status: DC | PRN

## 2017-01-12 ENCOUNTER — Other Ambulatory Visit: Payer: Self-pay | Admitting: *Deleted

## 2017-01-12 DIAGNOSIS — B20 Human immunodeficiency virus [HIV] disease: Secondary | ICD-10-CM

## 2017-01-12 DIAGNOSIS — F419 Anxiety disorder, unspecified: Secondary | ICD-10-CM

## 2017-01-12 DIAGNOSIS — I10 Essential (primary) hypertension: Secondary | ICD-10-CM

## 2017-01-12 MED ORDER — AMLODIPINE BESYLATE 5 MG PO TABS: ORAL_TABLET | ORAL | 3 refills | Status: DC

## 2017-01-12 MED ORDER — DIAZEPAM 5 MG PO TABS: 5.0000 mg | ORAL_TABLET | Freq: Every evening | ORAL | 1 refills | Status: DC | PRN

## 2017-01-12 MED ORDER — ABACAVIR-DOLUTEGRAVIR-LAMIVUD 600-50-300 MG PO TABS: 1.0000 | ORAL_TABLET | Freq: Every day | ORAL | 3 refills | Status: DC

## 2017-02-10 NOTE — Addendum Note (Signed)
Addended by: Jennet MaduroESTRIDGE, Kirsty Monjaraz D on: 02/10/2017 04:56 PM   Modules accepted: Orders

## 2017-03-09 ENCOUNTER — Other Ambulatory Visit: Payer: Medicaid Other

## 2017-03-11 ENCOUNTER — Other Ambulatory Visit: Payer: Self-pay | Admitting: *Deleted

## 2017-03-11 ENCOUNTER — Other Ambulatory Visit: Payer: Self-pay | Admitting: Internal Medicine

## 2017-03-11 DIAGNOSIS — F419 Anxiety disorder, unspecified: Secondary | ICD-10-CM

## 2017-03-11 DIAGNOSIS — R0602 Shortness of breath: Secondary | ICD-10-CM

## 2017-03-11 DIAGNOSIS — B20 Human immunodeficiency virus [HIV] disease: Secondary | ICD-10-CM

## 2017-03-11 MED ORDER — DIAZEPAM 5 MG PO TABS: 5.0000 mg | ORAL_TABLET | Freq: Every evening | ORAL | 1 refills | Status: DC | PRN

## 2017-03-23 ENCOUNTER — Ambulatory Visit: Payer: Medicaid Other | Admitting: Internal Medicine

## 2017-03-26 ENCOUNTER — Encounter: Payer: Self-pay | Admitting: Internal Medicine

## 2017-03-26 ENCOUNTER — Other Ambulatory Visit: Payer: Self-pay | Admitting: Internal Medicine

## 2017-03-26 ENCOUNTER — Ambulatory Visit: Payer: Medicaid Other | Admitting: Internal Medicine

## 2017-03-26 ENCOUNTER — Ambulatory Visit (INDEPENDENT_AMBULATORY_CARE_PROVIDER_SITE_OTHER): Payer: Medicaid Other | Admitting: Internal Medicine

## 2017-03-26 VITALS — BP 117/83 | HR 79 | Temp 98.3°F | Ht 63.0 in | Wt 143.0 lb

## 2017-03-26 DIAGNOSIS — B182 Chronic viral hepatitis C: Secondary | ICD-10-CM

## 2017-03-26 DIAGNOSIS — B20 Human immunodeficiency virus [HIV] disease: Secondary | ICD-10-CM

## 2017-03-26 DIAGNOSIS — Z23 Encounter for immunization: Secondary | ICD-10-CM | POA: Diagnosis not present

## 2017-03-26 DIAGNOSIS — Z0000 Encounter for general adult medical examination without abnormal findings: Secondary | ICD-10-CM

## 2017-03-26 DIAGNOSIS — H938X3 Other specified disorders of ear, bilateral: Secondary | ICD-10-CM

## 2017-03-26 DIAGNOSIS — F1110 Opioid abuse, uncomplicated: Secondary | ICD-10-CM

## 2017-03-26 NOTE — Assessment & Plan Note (Signed)
Since she has been off, will check in about 3 weeks and follow up in 4 weeks.

## 2017-03-26 NOTE — Progress Notes (Signed)
   Subjective:    Patient ID: Courtney Ellis, female    DOB: 07/25/1960, 56 y.o.   MRN: [id number]  HPI  Here for follow up of HIV and hepatitis C.  Has been on Triumeq and Prezcobix and off and on.  Now back on about 10 days.  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  No associated n/v/d.  No weight loss, no rashes.  Some URI symptoms, ear pain.    Review of Systems  Constitutional: Negative for chills and fever.  Gastrointestinal: Negative for diarrhea.  Skin: Negative for rash.       Objective:   Physical Exam  Constitutional: She appears well-developed and well-nourished. No distress.  HENT:  Right Ear: External ear normal.  Left Ear: External ear normal.  Mouth/Throat: Oropharynx is clear and moist. No oropharyngeal exudate.  Eyes: No scleral icterus.  Cardiovascular: Normal rate, regular rhythm and normal heart sounds.  No murmur heard. Pulmonary/Chest: Effort normal and breath sounds normal. No respiratory distress.  Lymphadenopathy:    She has no cervical adenopathy.  Skin: No rash noted.    SH: + heroin use some, struggling to quit      Assessment & Plan:

## 2017-03-26 NOTE — Assessment & Plan Note (Signed)
Encouraged cessation.

## 2017-03-26 NOTE — Assessment & Plan Note (Signed)
Will defer treatment for now with ongoing abuse and concern for reinfection.

## 2017-03-26 NOTE — Assessment & Plan Note (Signed)
Will try Benadryl

## 2017-04-16 ENCOUNTER — Other Ambulatory Visit (HOSPITAL_COMMUNITY)
Admission: RE | Admit: 2017-04-16 | Discharge: 2017-04-16 | Disposition: A | Discharge status: Home / Self Care | Payer: Medicaid Other | Source: Ambulatory Visit | Attending: Internal Medicine | Admitting: Internal Medicine

## 2017-04-16 ENCOUNTER — Other Ambulatory Visit: Payer: Medicaid Other

## 2017-04-16 DIAGNOSIS — Z79899 Other long term (current) drug therapy: Secondary | ICD-10-CM

## 2017-04-16 DIAGNOSIS — Z113 Encounter for screening for infections with a predominantly sexual mode of transmission: Secondary | ICD-10-CM | POA: Diagnosis not present

## 2017-04-16 DIAGNOSIS — B20 Human immunodeficiency virus [HIV] disease: Secondary | ICD-10-CM

## 2017-04-16 LAB — CBC WITH DIFFERENTIAL/PLATELET
BASOS ABS: 42 {cells}/uL (ref 0–200)
Basophils Relative: 0.8 %
EOS ABS: 127 {cells}/uL (ref 15–500)
EOS PCT: 2.4 %
HCT: 40.1 % (ref 35.0–45.0)
Hemoglobin: 13.7 g/dL (ref 11.7–15.5)
Lymphs Abs: 2528 cells/uL (ref 850–3900)
MCH: 29.3 pg (ref 27.0–33.0)
MCHC: 34.2 g/dL (ref 32.0–36.0)
MCV: 85.9 fL (ref 80.0–100.0)
MONOS PCT: 8.8 %
MPV: 10.9 fL (ref 7.5–12.5)
Neutro Abs: 2136 cells/uL (ref 1500–7800)
Neutrophils Relative %: 40.3 %
PLATELETS: 303 10*3/uL (ref 140–400)
RBC: 4.67 10*6/uL (ref 3.80–5.10)
RDW: 12.4 % (ref 11.0–15.0)
TOTAL LYMPHOCYTE: 47.7 %
WBC mixed population: 466 cells/uL (ref 200–950)
WBC: 5.3 10*3/uL (ref 3.8–10.8)

## 2017-04-17 LAB — COMPREHENSIVE METABOLIC PANEL
AG Ratio: 1 (calc) (ref 1.0–2.5)
ALBUMIN MSPROF: 3.8 g/dL (ref 3.6–5.1)
ALKALINE PHOSPHATASE (APISO): 261 U/L — AB (ref 33–130)
ALT: 38 U/L — ABNORMAL HIGH (ref 6–29)
AST: 59 U/L — ABNORMAL HIGH (ref 10–35)
BILIRUBIN TOTAL: 0.5 mg/dL (ref 0.2–1.2)
BUN: 16 mg/dL (ref 7–25)
CALCIUM: 10 mg/dL (ref 8.6–10.4)
CHLORIDE: 100 mmol/L (ref 98–110)
CO2: 29 mmol/L (ref 20–32)
CREATININE: 0.88 mg/dL (ref 0.50–1.05)
GLOBULIN: 4 g/dL — AB (ref 1.9–3.7)
Glucose, Bld: 190 mg/dL — ABNORMAL HIGH (ref 65–99)
POTASSIUM: 4.3 mmol/L (ref 3.5–5.3)
SODIUM: 136 mmol/L (ref 135–146)
TOTAL PROTEIN: 7.8 g/dL (ref 6.1–8.1)

## 2017-04-17 LAB — RPR: RPR: NONREACTIVE

## 2017-04-17 LAB — LIPID PANEL
CHOL/HDL RATIO: 3.5 (calc) (ref <5.0)
CHOLESTEROL: 198 mg/dL (ref <200)
HDL: 56 mg/dL (ref >50)
LDL CHOLESTEROL (CALC): 115 mg/dL — AB
Non-HDL Cholesterol (Calc): 142 mg/dL (calc) — ABNORMAL HIGH (ref <130)
Triglycerides: 155 mg/dL — ABNORMAL HIGH (ref <150)

## 2017-04-17 LAB — T-HELPER CELL (CD4) - (RCID CLINIC ONLY)
CD4 % Helper T Cell: 16 % — ABNORMAL LOW (ref 33–55)
CD4 T CELL ABS: 440 /uL (ref 400–2700)

## 2017-04-17 LAB — URINE CYTOLOGY ANCILLARY ONLY
CHLAMYDIA, DNA PROBE: NEGATIVE
NEISSERIA GONORRHEA: NEGATIVE

## 2017-04-18 LAB — HIV-1 RNA QUANT-NO REFLEX-BLD
HIV 1 RNA Quant: 20 copies/mL — ABNORMAL HIGH
HIV-1 RNA QUANT, LOG: 1.3 {Log_copies}/mL — AB

## 2017-04-27 ENCOUNTER — Ambulatory Visit (INDEPENDENT_AMBULATORY_CARE_PROVIDER_SITE_OTHER): Payer: Medicaid Other | Admitting: Internal Medicine

## 2017-04-27 ENCOUNTER — Encounter: Payer: Self-pay | Admitting: Internal Medicine

## 2017-04-27 ENCOUNTER — Ambulatory Visit (INDEPENDENT_AMBULATORY_CARE_PROVIDER_SITE_OTHER): Payer: Medicaid Other | Admitting: Licensed Clinical Social Worker

## 2017-04-27 VITALS — BP 134/87 | HR 72 | Temp 97.5°F | Wt 138.0 lb

## 2017-04-27 DIAGNOSIS — B182 Chronic viral hepatitis C: Secondary | ICD-10-CM | POA: Diagnosis present

## 2017-04-27 DIAGNOSIS — Z113 Encounter for screening for infections with a predominantly sexual mode of transmission: Secondary | ICD-10-CM

## 2017-04-27 DIAGNOSIS — F1121 Opioid dependence, in remission: Secondary | ICD-10-CM

## 2017-04-27 DIAGNOSIS — B20 Human immunodeficiency virus [HIV] disease: Secondary | ICD-10-CM

## 2017-04-27 DIAGNOSIS — F1110 Opioid abuse, uncomplicated: Secondary | ICD-10-CM

## 2017-04-27 DIAGNOSIS — Z1231 Encounter for screening mammogram for malignant neoplasm of breast: Secondary | ICD-10-CM | POA: Insufficient documentation

## 2017-04-27 DIAGNOSIS — F331 Major depressive disorder, recurrent, moderate: Secondary | ICD-10-CM

## 2017-04-27 NOTE — Assessment & Plan Note (Signed)
Doing well, back on track. rtc 3 months

## 2017-04-27 NOTE — Assessment & Plan Note (Signed)
Better now, she was visited by our counselor and has follow up with her.  Continuing with suboxone

## 2017-04-27 NOTE — Progress Notes (Signed)
   Subjective:    Patient ID: Curley Spiceonya M Massey, female    DOB: 08/14/1960, 57 y.o.   MRN: [id number]  HPI Here for follow up of HIV Has continued on Trimumeq and Prezcobix and denies any missed doses now.  CD4 440 and viral load 20.  Feels well now and also has remained drug free for over 3 weeks.  Feels this time is better for staying clean.  Takes some suboxone but does not feel she needs it daily now and is having no cravings.  She is pleased with her progress of remaining drug-free.    Review of Systems  Constitutional: Negative for fatigue.  Gastrointestinal: Negative for diarrhea.  Skin: Negative for rash.  Neurological: Negative for dizziness.       Objective:   Physical Exam  Constitutional: She appears well-developed and well-nourished. No distress.  HENT:  Mouth/Throat: No oropharyngeal exudate.  Eyes: No scleral icterus.  Cardiovascular: Normal rate, regular rhythm and normal heart sounds.  No murmur heard. Pulmonary/Chest: Effort normal and breath sounds normal. No respiratory distress.  Lymphadenopathy:    She has no cervical adenopathy.  Skin: No rash noted.   SH: + tobacco       Assessment & Plan:

## 2017-04-27 NOTE — Assessment & Plan Note (Signed)
I will repeat her labs next visit and consider treatment options if she remains off of IVDs.  I will get her elastography now.

## 2017-04-28 NOTE — BH Specialist Note (Signed)
Integrated Behavioral Health Initial Visit  MRN: [id number] Name: Courtney Ellis Code  Number of Integrated Behavioral Health Clinician visits:: 1/6 Session Start time: 3:24 pm  Session End time: 3:52 pm Total time: 28 mins  Type of Service: Integrated Behavioral Health- Individual/Family Interpretor:No. Interpretor Name and Language: N/A   Warm Hand Off Completed.       SUBJECTIVE: Courtney Ellis [place] is a 57 y.o. female accompanied by self Patient was referred by Dr. Luciana Axeomer for previous Opioid use and depression.  Patient reports the following symptoms/concerns: Patient reported that she [facility] and receives a 7 day prescription.  She reported that she attends day groups there and receives urinalysis twice a week, but does not receive mental health counseling.  Patient reported abstinence from Heroin for the past 30 days, with last use on Dec 13th.  Patient reported that she has been able to stay sober by working on her recovery by: reading Engineer, structuralinspirational material, attending recovery groups, going to church, and staying busy with school.  Patient reported that her cravings are being managed well on Suboxone and denied having active cravings.  Patient was also able to list her triggers to use and denied being around people using and reported actively avoiding places and her former Consulting civil engineerdealer.  [facility] processed her poor recovery environment and the amount of drugs sold in her neighborhood.  Patient reported that she is learning new behaviors and wants to remain sober.  Patient denied Alcohol use.  Patient expressed frustration and feeling overwhelmed with trying to live sober, keep up with appointments and school, and raise her sons.  Intervention:  [facility] and [facility] discussed her current living environment and ways to avoid high-risk situations, and reinforcers of positive change.  Duration of problem: decades of Heroin use;  Severity of problem: moderate  OBJECTIVE: Mood: Depressed and Frustrated and Affect: within range Risk of harm to self or others: No plan to harm self or others  LIFE CONTEXT: Family and Social: Patient lives with her three sons and wants to relocate to new housing with a better recovery environment that will assist in maintaining abstinence. 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 Self-Care: Patient is able to tend to her ADL's. Life Changes: Patient is learning how to maintain her sobriety and engage in new activities.  ASSESSMENT: Patient is currently experiencing depressive symptoms and frustration over her living environment and how it places her abstinence and recovery in jepardy.  Patient reports abstinence from Heroin for 30 days, presenting in the maintenance stage of change.  Patient may benefit from behavioral health services, medication management, and drug treatment.  GOALS ADDRESSED: Patient will: 1. Reduce symptoms of: depression and frustration and substance use 2. Increase knowledge and/or ability of: coping skills, healthy habits and stress reduction  3. Demonstrate ability to: Increase healthy adjustment to current life circumstances, Increase adequate support systems for patient/family and Decrease self-medicating behaviors  INTERVENTIONS: Interventions utilized: Motivational Interviewing   PLAN: 1. Follow up with behavioral health clinician on : Tues Jan 15th at 9 am 2. At next session will address ways to prevent feeling overwhelmed to prevent depression and relapse.   Vergia AlbertsSherry Letitia Sabala, [facility]

## 2017-04-29 ENCOUNTER — Ambulatory Visit (HOSPITAL_COMMUNITY)
Admission: RE | Admit: 2017-04-29 | Discharge: 2017-04-29 | Disposition: A | Discharge status: Home / Self Care | Payer: Medicaid Other | Source: Ambulatory Visit | Attending: Internal Medicine | Admitting: Internal Medicine

## 2017-04-29 DIAGNOSIS — K838 Other specified diseases of biliary tract: Secondary | ICD-10-CM | POA: Diagnosis not present

## 2017-04-29 DIAGNOSIS — B182 Chronic viral hepatitis C: Secondary | ICD-10-CM | POA: Diagnosis not present

## 2017-05-05 ENCOUNTER — Ambulatory Visit (INDEPENDENT_AMBULATORY_CARE_PROVIDER_SITE_OTHER): Payer: Medicaid Other | Admitting: Licensed Clinical Social Worker

## 2017-05-05 DIAGNOSIS — F1121 Opioid dependence, in remission: Secondary | ICD-10-CM

## 2017-05-05 DIAGNOSIS — F331 Major depressive disorder, recurrent, moderate: Secondary | ICD-10-CM

## 2017-05-08 NOTE — BH Specialist Note (Signed)
Integrated Behavioral Health Follow Up Visit  MRN: [id number] Name: Courtney Ellis  Number of Integrated Behavioral Health Clinician visits: 2/6 Session Start time: 9:09 am  Session End time: 10:09 am Total time: 1 hour  Type of Service: Integrated Behavioral Health- Individual/Family Interpretor:No. Interpretor Name and Language: N/A  SUBJECTIVE: Courtney Spiceonya M Deguzman is a 57 y.o. female accompanied by self Patient was referred by Dr. Luciana Axeomer for previous Opioid use and depression.  Patient was verbal in expressing her feelings of being overwhelmed managing the stress of her living environment.  Patient reported that she is taking care of her sister, her teenage sons, and another person in recovery.  Patient was able to identify the coping strategies that she is using as attending church, meditation, attending support groups, and working out.  Patient reported sleep issues with waking often, sleeping only three hours last night.  Also patient reported that her sister is continuing to use drugs and be around her under the influence, which serves as a trigger for her to relapse.  Patient reported that the longer her sister remains in her household that her abstinence is at risk.  [facility] provided support while the patient explored possible options.  [facility] and patient processed her feelings of responsibility for others and the loss of her childhood due to raising her siblings while her father worked.  Patient gained insight from reviewing the expectations that she care for her family, at the expense of her life.  Patient was able to identify that her sister continues to act in the role of the younger sibling with needing to be taken care of.  Patient also gained insight that caring for others has left little time or room to engage in self-care.  Patient was receptive to education from the [facility] on the importance of self-care and was receptive to encouragement from the [facility] to engage in self-care and ways to engage.   Patient presented as unworthy of engaging in self-care and with issues of self-esteem.  Patient was able to engage in creating positive affirmations to assist in managing feelings of being overwhelmed and with little self-love.  Patient created affirmations of "I am not alone" and "Things will get better".  [facility] also educated patient on mandelas and how to incorporate into meditation sessions, to assist with using creative expression.  Patient presented as excited learning about the mandelas and was looking forward to using.  Duration of problem: decades of Heroin use and months of depression symptoms; Severity of problem: moderate  OBJECTIVE: Mood: Anxious and Depressed and Affect: within range Risk of harm to self or others: No plan to harm self or others  Behavior: Tearful Thought Process: tangential Thought Content: logical  ASSESSMENT: Patient is currently experiencing depressive symptoms and frustration over her living environment, how it is overwhelming and places her abstinence and recovery in jeopardy.  Patient reports abstinence from Heroin for over 30 days, presenting in the maintenance stage of change.  Patient may benefit from behavioral health services, medication management, and drug treatment.  GOALS ADDRESSED: Patient will: 1.  Reduce symptoms of: depression and frustration and substance use  2.  Increase knowledge and/or ability of: coping skills, healthy habits and stress reduction  3.  Demonstrate ability to: Increase healthy adjustment to current life circumstances, Increase adequate support systems for patient/family and Decrease self-medicating behaviors  INTERVENTIONS: Interventions utilized:  Mindfulness or Management consultantelaxation Training, Supportive Counseling and Psychoeducation and/or Health Education  PLAN: 1. Follow up with behavioral health clinician on :  Mon Jan 21 at 8:45 am 2. Behavioral recommendations: Patient will complete one self-care act within the next 7 days.   Read affirmations each day.  Complete selected mandelas and incorporate into meditation practices. 3. At next session will address patient's sleep issues and review status of using mandelas and positive affirmations.  Educate patient on good sleep hygiene and additional relaxation techniques.  Vergia Alberts, [facility]

## 2017-05-11 ENCOUNTER — Ambulatory Visit: Payer: Medicaid Other | Admitting: Licensed Clinical Social Worker

## 2017-05-12 ENCOUNTER — Telehealth: Payer: Self-pay | Admitting: Licensed Clinical Social Worker

## 2017-05-12 NOTE — Telephone Encounter (Signed)
[facility] called patient regarding missed appointment and left a message requesting a return call.  Courtney AlbertsSherry Nirvi Ellis, [facility]

## 2017-05-21 ENCOUNTER — Telehealth: Payer: Self-pay

## 2017-05-21 NOTE — Telephone Encounter (Signed)
Not too bad actually.  This is an ultrasound with elastography in preparation for hepatitis C Treatment.  F2/3 which is some scarring but not cirrhosis.  It says there is some ductal dilitation which is nothing of concern.   thanks

## 2017-05-21 NOTE — Telephone Encounter (Signed)
Message left on machine requesting ultrasound results.   Laurell Josephsammy K King, RN

## 2017-07-20 ENCOUNTER — Other Ambulatory Visit: Payer: Medicaid Other

## 2017-07-22 ENCOUNTER — Other Ambulatory Visit: Payer: Self-pay

## 2017-07-22 DIAGNOSIS — B20 Human immunodeficiency virus [HIV] disease: Secondary | ICD-10-CM

## 2017-07-22 DIAGNOSIS — I10 Essential (primary) hypertension: Secondary | ICD-10-CM

## 2017-07-22 MED ORDER — DARUNAVIR-COBICISTAT 800-150 MG PO TABS: ORAL_TABLET | ORAL | 4 refills | Status: DC

## 2017-07-22 MED ORDER — AMLODIPINE BESYLATE 5 MG PO TABS: ORAL_TABLET | ORAL | 3 refills | Status: DC

## 2017-07-22 MED ORDER — ABACAVIR-DOLUTEGRAVIR-LAMIVUD 600-50-300 MG PO TABS: 1.0000 | ORAL_TABLET | Freq: Every day | ORAL | 3 refills | Status: DC

## 2017-08-04 ENCOUNTER — Ambulatory Visit: Payer: Medicaid Other | Admitting: Internal Medicine

## 2017-08-19 ENCOUNTER — Other Ambulatory Visit: Payer: Medicaid Other

## 2017-08-21 ENCOUNTER — Other Ambulatory Visit: Payer: Self-pay | Admitting: *Deleted

## 2017-08-21 DIAGNOSIS — R0602 Shortness of breath: Secondary | ICD-10-CM

## 2017-08-21 MED ORDER — ALBUTEROL SULFATE HFA 108 (90 BASE) MCG/ACT IN AERS: INHALATION_SPRAY | RESPIRATORY_TRACT | 4 refills | Status: DC

## 2017-09-02 ENCOUNTER — Ambulatory Visit: Payer: Medicaid Other | Admitting: Internal Medicine

## 2017-09-10 ENCOUNTER — Other Ambulatory Visit: Payer: Medicaid Other

## 2017-09-11 ENCOUNTER — Other Ambulatory Visit: Payer: Medicaid Other

## 2017-09-11 LAB — CBC WITH DIFFERENTIAL/PLATELET
BASOS ABS: 65 {cells}/uL (ref 0–200)
Basophils Relative: 0.6 %
EOS PCT: 1.1 %
Eosinophils Absolute: 120 cells/uL (ref 15–500)
HCT: 46.2 % — ABNORMAL HIGH (ref 35.0–45.0)
HEMOGLOBIN: 16.1 g/dL — AB (ref 11.7–15.5)
LYMPHS ABS: 4000 {cells}/uL — AB (ref 850–3900)
MCH: 29.4 pg (ref 27.0–33.0)
MCHC: 34.8 g/dL (ref 32.0–36.0)
MCV: 84.3 fL (ref 80.0–100.0)
MONOS PCT: 9.8 %
MPV: 10.4 fL (ref 7.5–12.5)
NEUTROS ABS: 5646 {cells}/uL (ref 1500–7800)
Neutrophils Relative %: 51.8 %
Platelets: 374 10*3/uL (ref 140–400)
RBC: 5.48 10*6/uL — AB (ref 3.80–5.10)
RDW: 12.7 % (ref 11.0–15.0)
Total Lymphocyte: 36.7 %
WBC mixed population: 1068 cells/uL — ABNORMAL HIGH (ref 200–950)
WBC: 10.9 10*3/uL — AB (ref 3.8–10.8)

## 2017-09-11 LAB — COMPLETE METABOLIC PANEL WITH GFR
AG Ratio: 0.9 (calc) — ABNORMAL LOW (ref 1.0–2.5)
ALBUMIN MSPROF: 4.2 g/dL (ref 3.6–5.1)
ALKALINE PHOSPHATASE (APISO): 103 U/L (ref 33–130)
ALT: 24 U/L (ref 6–29)
AST: 29 U/L (ref 10–35)
BUN/Creatinine Ratio: 24 (calc) — ABNORMAL HIGH (ref 6–22)
BUN: 33 mg/dL — AB (ref 7–25)
CALCIUM: 10.3 mg/dL (ref 8.6–10.4)
CO2: 18 mmol/L — AB (ref 20–32)
CREATININE: 1.39 mg/dL — AB (ref 0.50–1.05)
Chloride: 101 mmol/L (ref 98–110)
GFR, EST NON AFRICAN AMERICAN: 43 mL/min/{1.73_m2} — AB (ref >=60)
GFR, Est African American: 49 mL/min/{1.73_m2} — ABNORMAL LOW (ref >=60)
GLUCOSE: 97 mg/dL (ref 65–99)
Globulin: 4.8 g/dL (calc) — ABNORMAL HIGH (ref 1.9–3.7)
Potassium: 4.1 mmol/L (ref 3.5–5.3)
Sodium: 133 mmol/L — ABNORMAL LOW (ref 135–146)
Total Bilirubin: 0.8 mg/dL (ref 0.2–1.2)
Total Protein: 9 g/dL — ABNORMAL HIGH (ref 6.1–8.1)

## 2017-09-11 LAB — PROTIME-INR
INR: 1
PROTHROMBIN TIME: 10.3 s (ref 9.0–11.5)

## 2017-09-11 LAB — HEPATITIS B SURFACE ANTIGEN: HEP B S AG: NONREACTIVE

## 2017-09-11 LAB — T-HELPER CELL (CD4) - (RCID CLINIC ONLY)
CD4 T CELL HELPER: 16 % — AB (ref 33–55)
CD4 T Cell Abs: 640 /uL (ref 400–2700)

## 2017-09-11 LAB — RPR: RPR Ser Ql: NONREACTIVE

## 2017-09-12 LAB — HIV-1 RNA QUANT-NO REFLEX-BLD
HIV 1 RNA Quant: 28 copies/mL — ABNORMAL HIGH
HIV-1 RNA QUANT, LOG: 1.45 {Log_copies}/mL — AB

## 2017-09-15 LAB — HEPATITIS C RNA QUANTITATIVE
HCV Quantitative Log: 7.34 Log IU/mL — ABNORMAL HIGH
HCV RNA, PCR, QN: 21700000 IU/mL — ABNORMAL HIGH

## 2017-09-15 LAB — HEPATITIS C GENOTYPE

## 2017-09-17 LAB — LIVER FIBROSIS, FIBROTEST-ACTITEST
ALT: 22 U/L (ref 6–29)
Alpha-2-Macroglobulin: 486 mg/dL — ABNORMAL HIGH (ref 106–279)
Apolipoprotein A1: 152 mg/dL (ref 101–198)
Bilirubin: 0.7 mg/dL (ref 0.2–1.2)
Fibrosis Score: 0.67
GGT: 98 U/L — ABNORMAL HIGH (ref 3–70)
Haptoglobin: 218 mg/dL — ABNORMAL HIGH (ref 43–212)
NECROINFLAMMAT ACT SCORE: 0.15
REFERENCE ID: 2485280

## 2017-10-05 ENCOUNTER — Encounter: Payer: Self-pay | Admitting: Internal Medicine

## 2017-10-05 ENCOUNTER — Ambulatory Visit (INDEPENDENT_AMBULATORY_CARE_PROVIDER_SITE_OTHER): Payer: Medicaid Other | Admitting: Internal Medicine

## 2017-10-05 VITALS — BP 113/75 | HR 83 | Temp 98.3°F | Wt 143.0 lb

## 2017-10-05 DIAGNOSIS — K7400 Hepatic fibrosis, unspecified: Secondary | ICD-10-CM

## 2017-10-05 DIAGNOSIS — B182 Chronic viral hepatitis C: Secondary | ICD-10-CM

## 2017-10-05 DIAGNOSIS — B20 Human immunodeficiency virus [HIV] disease: Secondary | ICD-10-CM | POA: Diagnosis not present

## 2017-10-05 DIAGNOSIS — Z23 Encounter for immunization: Secondary | ICD-10-CM | POA: Diagnosis not present

## 2017-10-05 NOTE — Progress Notes (Signed)
   Subjective:    Patient ID: Courtney Ellis, female    DOB: 12/12/1960, 57 y.o.   MRN: [id number]  HPI Here for follow up of HIV Has continued on Trimumeq and Prezcobix and denies any missed doses now.  CD4 640 and viral load 28. 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  Continues to go to therapy.  On suboxone daily again.   Did get hepatitis C labs and has genotype 1a and elastography with F2/3.    Review of Systems  Constitutional: Negative for fatigue.  Gastrointestinal: Negative for diarrhea.  Skin: Negative for rash.  Neurological: Negative for dizziness.       Objective:   Physical Exam  Constitutional: She appears well-developed and well-nourished. No distress.  HENT:  Mouth/Throat: No oropharyngeal exudate.  Eyes: No scleral icterus.  Cardiovascular: Normal rate, regular rhythm and normal heart sounds.  No murmur heard. Pulmonary/Chest: Effort normal and breath sounds normal. No respiratory distress.  Lymphadenopathy:    She has no cervical adenopathy.  Skin: No rash noted.   SH: + tobacco       Assessment & Plan:

## 2017-10-06 DIAGNOSIS — K7400 Hepatic fibrosis, unspecified: Secondary | ICD-10-CM | POA: Insufficient documentation

## 2017-10-06 NOTE — Assessment & Plan Note (Signed)
She is doing well with this.  No changes.

## 2017-10-06 NOTE — Assessment & Plan Note (Signed)
F2/3.  No cirrhosis.  Discussed with the patient.

## 2017-10-06 NOTE — Assessment & Plan Note (Signed)
Will defer treatment for now pending drug addiction issues.

## 2017-12-24 ENCOUNTER — Other Ambulatory Visit: Payer: Self-pay | Admitting: Otolaryngology

## 2017-12-30 ENCOUNTER — Other Ambulatory Visit: Payer: Medicaid Other

## 2018-01-08 ENCOUNTER — Telehealth: Payer: Self-pay

## 2018-01-08 NOTE — Telephone Encounter (Signed)
-----   Message from [place]ohaura Celedonio sent at 01/08/2018  9:07 AM EDT ----- Contact: [phone] Patient is having issues sleeping, and some other problems. Would like to come in and see Dr Luciana Axeomer

## 2018-01-08 NOTE — Telephone Encounter (Signed)
Patient requesting appointment to discuss issues sleeping with Dr Luciana Axeomer.  She called earlier today and wanted same day appointment.  I was only able to offer appointment for Monday of next week which she accepted.  4:34

## 2018-01-11 ENCOUNTER — Ambulatory Visit: Payer: Medicaid Other | Admitting: Internal Medicine

## 2018-01-12 ENCOUNTER — Ambulatory Visit (INDEPENDENT_AMBULATORY_CARE_PROVIDER_SITE_OTHER): Payer: Medicaid Other | Admitting: Internal Medicine

## 2018-01-12 ENCOUNTER — Encounter: Payer: Self-pay | Admitting: Internal Medicine

## 2018-01-12 ENCOUNTER — Ambulatory Visit: Payer: Medicaid Other | Admitting: Licensed Clinical Social Worker

## 2018-01-12 DIAGNOSIS — F5104 Psychophysiologic insomnia: Secondary | ICD-10-CM

## 2018-01-12 DIAGNOSIS — F410 Panic disorder [episodic paroxysmal anxiety] without agoraphobia: Secondary | ICD-10-CM | POA: Insufficient documentation

## 2018-01-12 DIAGNOSIS — G4700 Insomnia, unspecified: Secondary | ICD-10-CM | POA: Insufficient documentation

## 2018-01-12 NOTE — Progress Notes (Signed)
   Subjective:    Patient ID: Courtney Ellis, female    DOB: 06/28/1960, 57 y.o.   MRN: [id number]  HPI She is here for a work in visit today for trouble sleeping. She describes staying up till 5 AM unable to sleep sometimes only 45 minutes at a time and has been going on for weeks.  Her main complaint and likely cause of her sleep is significant panic attacks.  She notes her heart rate increasing what she feels is in the 100s, her mind is racing and she is unable to concentrate.  She is tearful today.  She does not see psychiatry or any counseling.   Review of Systems  Psychiatric/Behavioral: Positive for sleep disturbance. Negative for self-injury and suicidal ideas.       Objective:   Physical Exam  Constitutional: She appears well-developed and well-nourished.  Eyes: No scleral icterus.  Psychiatric: She has a normal mood and affect. Her behavior is normal.  Tearful Appropriate behavior, no suicidal ideation.   SH: drug free at this time       Assessment & Plan:

## 2018-01-12 NOTE — Assessment & Plan Note (Signed)
I think this is mainly related to underlying mental illness or panic disorder.  She does have trazodone 100 mg, though she says this makes her drowsy so we will try 50 mg.

## 2018-01-12 NOTE — Assessment & Plan Note (Signed)
This is a new problem, this has not been treated before.  She will see our counselor today.  She will also be given information on [facility].

## 2018-01-13 ENCOUNTER — Encounter: Payer: Medicaid Other | Admitting: Internal Medicine

## 2018-01-18 ENCOUNTER — Telehealth: Payer: Self-pay | Admitting: *Deleted

## 2018-01-18 NOTE — Telephone Encounter (Signed)
Patient called and advised she got several calls from the office and no messages left. She is wanting to know what they were about. Advised her will ask the provider and give her a call back. she is in a meeting for a job from 12 to 2 today but says we can leave a message once we find out.

## 2018-01-18 NOTE — Telephone Encounter (Signed)
I would guess Rene Kocher since she was not able to see her at the visit.

## 2018-01-19 NOTE — Telephone Encounter (Signed)
Called the patient and had to leave a message that Dr Luciana Axe did not call and it was probably our counselor who called to try and schedule an appointment with her.

## 2018-01-21 ENCOUNTER — Other Ambulatory Visit: Payer: Medicaid Other

## 2018-01-21 DIAGNOSIS — B20 Human immunodeficiency virus [HIV] disease: Secondary | ICD-10-CM | POA: Diagnosis not present

## 2018-01-21 DIAGNOSIS — B182 Chronic viral hepatitis C: Secondary | ICD-10-CM

## 2018-01-22 ENCOUNTER — Other Ambulatory Visit: Payer: Self-pay | Admitting: *Deleted

## 2018-01-22 DIAGNOSIS — B20 Human immunodeficiency virus [HIV] disease: Secondary | ICD-10-CM

## 2018-01-22 LAB — T-HELPER CELL (CD4) - (RCID CLINIC ONLY)
CD4 T CELL HELPER: 14 % — AB (ref 33–55)
CD4 T Cell Abs: 380 /uL — ABNORMAL LOW (ref 400–2700)

## 2018-01-22 MED ORDER — ABACAVIR-DOLUTEGRAVIR-LAMIVUD 600-50-300 MG PO TABS: 1.0000 | ORAL_TABLET | Freq: Every day | ORAL | 5 refills | Status: DC

## 2018-01-23 LAB — HEPATITIS C RNA QUANTITATIVE
HCV Quantitative Log: 7.18 Log IU/mL — ABNORMAL HIGH
HCV RNA, PCR, QN: 15000000 [IU]/mL — AB

## 2018-01-23 LAB — HIV-1 RNA QUANT-NO REFLEX-BLD
HIV 1 RNA Quant: 27 copies/mL — ABNORMAL HIGH
HIV-1 RNA QUANT, LOG: 1.43 {Log_copies}/mL — AB

## 2018-02-04 ENCOUNTER — Encounter: Payer: Self-pay | Admitting: Internal Medicine

## 2018-02-04 ENCOUNTER — Ambulatory Visit (INDEPENDENT_AMBULATORY_CARE_PROVIDER_SITE_OTHER): Payer: Medicaid Other | Admitting: Internal Medicine

## 2018-02-04 ENCOUNTER — Other Ambulatory Visit: Payer: Self-pay | Admitting: Behavioral Health

## 2018-02-04 DIAGNOSIS — B20 Human immunodeficiency virus [HIV] disease: Secondary | ICD-10-CM

## 2018-02-04 DIAGNOSIS — B182 Chronic viral hepatitis C: Secondary | ICD-10-CM | POA: Diagnosis not present

## 2018-02-04 DIAGNOSIS — Z23 Encounter for immunization: Secondary | ICD-10-CM | POA: Diagnosis not present

## 2018-02-04 DIAGNOSIS — F1110 Opioid abuse, uncomplicated: Secondary | ICD-10-CM | POA: Diagnosis not present

## 2018-02-04 DIAGNOSIS — F5104 Psychophysiologic insomnia: Secondary | ICD-10-CM | POA: Diagnosis not present

## 2018-02-04 DIAGNOSIS — I10 Essential (primary) hypertension: Secondary | ICD-10-CM

## 2018-02-04 MED ORDER — AMLODIPINE BESYLATE 5 MG PO TABS: ORAL_TABLET | ORAL | 5 refills | Status: DC

## 2018-02-04 MED ORDER — ABACAVIR-DOLUTEGRAVIR-LAMIVUD 600-50-300 MG PO TABS: 1.0000 | ORAL_TABLET | Freq: Every day | ORAL | 5 refills | Status: DC

## 2018-02-05 ENCOUNTER — Other Ambulatory Visit: Payer: Self-pay | Admitting: Internal Medicine

## 2018-02-05 DIAGNOSIS — R0602 Shortness of breath: Secondary | ICD-10-CM

## 2018-02-05 MED ORDER — GLECAPREVIR-PIBRENTASVIR 100-40 MG PO TABS: 3.0000 | ORAL_TABLET | Freq: Every day | ORAL | 2 refills | Status: DC

## 2018-02-05 NOTE — Progress Notes (Signed)
   Subjective:    Patient ID: Courtney Ellis, female    DOB: 03-05-1961, 57 y.o.   MRN: [id number]  HPI Here for follow up of HIV She continues to struggle with anxiety, depression, substance abuse and hoping for some help.  She has not yet been to [facility].  She continues on Triumeq + Prezcobix and CD4 of 380 and viral load suppressed at 27 copies.  No other new issues.  Hopeful to get hepatitis C treatment.  Getting counseling now here.  No associate rash, diarrhea. Still difficulty sleeping but trazodone makes her groggy.     Review of Systems  Constitutional: Negative for fatigue.  Psychiatric/Behavioral: Positive for sleep disturbance. Negative for suicidal ideas.       Objective:   Physical Exam  Constitutional: She appears well-developed and well-nourished.  Eyes: No scleral icterus.  Cardiovascular: Normal rate, regular rhythm and normal heart sounds.  No murmur heard. Pulmonary/Chest: Effort normal. No respiratory distress.  Lymphadenopathy:    She has no cervical adenopathy.  Skin: No erythema.   SH: continued substance abuse issues, not daily use       Assessment & Plan:

## 2018-02-05 NOTE — Assessment & Plan Note (Signed)
Will continue to follow our counselor

## 2018-02-05 NOTE — Assessment & Plan Note (Signed)
I will try to get her on Mavyret

## 2018-02-05 NOTE — Assessment & Plan Note (Signed)
She is doing well from this standpoint and will continue with her ARVs

## 2018-02-05 NOTE — Assessment & Plan Note (Signed)
Will try half trazodone

## 2018-02-09 ENCOUNTER — Telehealth: Payer: Self-pay | Admitting: Pharmacist

## 2018-02-09 DIAGNOSIS — B182 Chronic viral hepatitis C: Secondary | ICD-10-CM

## 2018-02-09 MED ORDER — SOFOSBUVIR-VELPATASVIR 400-100 MG PO TABS: 1.0000 | ORAL_TABLET | Freq: Every day | ORAL | 2 refills | Status: DC

## 2018-02-09 NOTE — Telephone Encounter (Signed)
Patient's Mavyret is contraindicated with his Triumeq and Prezcobix. Will send in Epclusa x 12 weeks to see if Medicaid will approve. Will write an appeal letter if needed.

## 2018-02-10 ENCOUNTER — Telehealth: Payer: Self-pay | Admitting: Pharmacy Technician

## 2018-02-10 ENCOUNTER — Encounter: Payer: Self-pay | Admitting: Pharmacy Technician

## 2018-02-10 NOTE — Telephone Encounter (Signed)
No problem.

## 2018-02-10 NOTE — Telephone Encounter (Signed)
I forgot, thanks!

## 2018-02-11 ENCOUNTER — Institutional Professional Consult (permissible substitution): Payer: Medicaid Other | Admitting: Licensed Clinical Social Worker

## 2018-02-11 MED FILL — SOFOSBUVIR-VELPATASVIR 400-: 400-100 | 28 days supply | Qty: 28 | Fill #0

## 2018-03-02 NOTE — Telephone Encounter (Signed)
D

## 2018-03-09 ENCOUNTER — Telehealth: Payer: Self-pay | Admitting: Pharmacist

## 2018-03-09 NOTE — Telephone Encounter (Signed)
[facility] [place] had not yet started her Epclusa when they called her for her first refill because she wanted to speak to someone at the clinic first. I called Courtney Ellis to speak with her regarding her concerns. She was worried that she might experience "crazy side effects." I assured her that [name] is generally very well tolerated with very mild side effects including headache, fatigue, and GI upset. Courtney Ellis agreed to start [facility]. I reminded her about the importance of taking the medicine every day. We will have [facility] go ahead and send her second fill so that her insurance PA does not run out. I rescheduled her follow up appointment in the pharmacy clinic to 12/19.

## 2018-03-10 MED FILL — SOFOSBUVIR-VELPATASVIR 400-: 400-100 | 28 days supply | Qty: 28 | Fill #1

## 2018-03-11 ENCOUNTER — Ambulatory Visit: Payer: Medicaid Other

## 2018-03-22 ENCOUNTER — Telehealth: Payer: Self-pay | Admitting: *Deleted

## 2018-03-22 ENCOUNTER — Encounter: Payer: Self-pay | Admitting: *Deleted

## 2018-03-22 DIAGNOSIS — H9201 Otalgia, right ear: Principal | ICD-10-CM

## 2018-03-22 DIAGNOSIS — G8929 Other chronic pain: Secondary | ICD-10-CM

## 2018-03-22 NOTE — Telephone Encounter (Signed)
Patient reporting right sided pain from ear to throat to temple. She describes it as a stabbing pain, happening multiple times a day and no longer controlled with 81 mg aspirin.  The pain was too intense for her to work on Saturday, so she left her job and went home. She states she went to ENT in August, they found no cause to her pain.   She is asking 1) for a work note for Saturday and 2) what to do next?  Please advise. Andree CossHowell, Amaurie Wandel M, RN

## 2018-03-22 NOTE — Telephone Encounter (Signed)
Note at front desk for patient pick up

## 2018-03-22 NOTE — Telephone Encounter (Signed)
Letter written, ambulatory referral placed. Thank you

## 2018-03-22 NOTE — Addendum Note (Signed)
Addended by: Andree CossHOWELL, Decklin Weddington M on: 03/22/2018 04:32 PM   Modules accepted: Orders

## 2018-03-22 NOTE — Telephone Encounter (Signed)
Ok for a note.  I would say neurology would be the next step.

## 2018-03-23 ENCOUNTER — Encounter: Payer: Self-pay | Admitting: Internal Medicine

## 2018-03-27 ENCOUNTER — Other Ambulatory Visit: Payer: Self-pay

## 2018-03-27 ENCOUNTER — Emergency Department (HOSPITAL_COMMUNITY)
Admission: EM | Admit: 2018-03-27 | Discharge: 2018-03-27 | Disposition: A | Discharge status: Home / Self Care | Payer: Medicaid Other | Attending: Emergency Medicine | Admitting: Emergency Medicine

## 2018-03-27 ENCOUNTER — Encounter (HOSPITAL_COMMUNITY): Payer: Self-pay

## 2018-03-27 DIAGNOSIS — I10 Essential (primary) hypertension: Secondary | ICD-10-CM | POA: Diagnosis not present

## 2018-03-27 DIAGNOSIS — Y929 Unspecified place or not applicable: Secondary | ICD-10-CM | POA: Insufficient documentation

## 2018-03-27 DIAGNOSIS — B20 Human immunodeficiency virus [HIV] disease: Secondary | ICD-10-CM | POA: Diagnosis not present

## 2018-03-27 DIAGNOSIS — M542 Cervicalgia: Secondary | ICD-10-CM | POA: Insufficient documentation

## 2018-03-27 DIAGNOSIS — Y999 Unspecified external cause status: Secondary | ICD-10-CM | POA: Diagnosis not present

## 2018-03-27 DIAGNOSIS — Y939 Activity, unspecified: Secondary | ICD-10-CM | POA: Insufficient documentation

## 2018-03-27 DIAGNOSIS — R519 Headache, unspecified: Secondary | ICD-10-CM

## 2018-03-27 DIAGNOSIS — S39012A Strain of muscle, fascia and tendon of lower back, initial encounter: Secondary | ICD-10-CM | POA: Diagnosis not present

## 2018-03-27 DIAGNOSIS — R51 Headache: Secondary | ICD-10-CM | POA: Insufficient documentation

## 2018-03-27 DIAGNOSIS — F17210 Nicotine dependence, cigarettes, uncomplicated: Secondary | ICD-10-CM | POA: Diagnosis not present

## 2018-03-27 DIAGNOSIS — Z79899 Other long term (current) drug therapy: Secondary | ICD-10-CM | POA: Insufficient documentation

## 2018-03-27 MED ORDER — DOXYLAMINE SUCCINATE (SLEEP) 25 MG PO TABS: 25.0000 mg | ORAL_TABLET | Freq: Every evening | ORAL | 0 refills | Status: DC | PRN

## 2018-03-27 MED ORDER — MELOXICAM 15 MG PO TABS: 15.0000 mg | ORAL_TABLET | Freq: Every day | ORAL | 0 refills | Status: DC

## 2018-03-27 MED ORDER — METHOCARBAMOL 500 MG PO TABS: 500.0000 mg | ORAL_TABLET | Freq: Three times a day (TID) | ORAL | 0 refills | Status: DC | PRN
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
## 2018-03-27 NOTE — ED Notes (Signed)
PT states understanding of care given, follow up care, and medication prescribed. PT ambulated from ED to car with a steady gait. 
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
## 2018-04-02 ENCOUNTER — Telehealth: Payer: Self-pay | Admitting: Neurology

## 2018-04-02 ENCOUNTER — Ambulatory Visit: Payer: Self-pay | Admitting: Neurology

## 2018-04-02 NOTE — Telephone Encounter (Signed)
This patient did not show for a new patient appointment. 

## 2018-04-06 ENCOUNTER — Encounter: Payer: Self-pay | Admitting: Neurology

## 2018-04-08 ENCOUNTER — Ambulatory Visit: Payer: Medicaid Other

## 2018-04-08 ENCOUNTER — Ambulatory Visit: Payer: Medicaid Other | Admitting: Neurology

## 2018-04-08 ENCOUNTER — Encounter: Payer: Self-pay | Admitting: Neurology

## 2018-04-08 ENCOUNTER — Telehealth: Payer: Self-pay | Admitting: Neurology

## 2018-04-08 NOTE — Telephone Encounter (Signed)
This patient did not show for a new patient appointment today, this is her second no-show.  The patient will be discharged from our practice.

## 2018-04-09 MED FILL — SOFOSBUVIR-VELPATASVIR 400-: 400-100 | 28 days supply | Qty: 28 | Fill #2

## 2018-04-22 ENCOUNTER — Other Ambulatory Visit: Payer: Self-pay | Admitting: Behavioral Health

## 2018-04-22 DIAGNOSIS — B20 Human immunodeficiency virus [HIV] disease: Secondary | ICD-10-CM

## 2018-04-22 MED ORDER — DARUNAVIR-COBICISTAT 800-150 MG PO TABS: ORAL_TABLET | ORAL | 2 refills | Status: DC

## 2018-05-06 ENCOUNTER — Other Ambulatory Visit: Payer: Self-pay | Admitting: Internal Medicine

## 2018-05-06 DIAGNOSIS — R0602 Shortness of breath: Secondary | ICD-10-CM

## 2018-05-13 ENCOUNTER — Ambulatory Visit: Payer: Medicaid Other

## 2018-05-13 NOTE — Congregational Nurse Program (Signed)
Pt voiced no physical concerns at today's visit. No signs of distress observed at today's visit. 

## 2018-05-14 ENCOUNTER — Ambulatory Visit (INDEPENDENT_AMBULATORY_CARE_PROVIDER_SITE_OTHER): Payer: Medicaid Other | Admitting: Pharmacist

## 2018-05-14 ENCOUNTER — Other Ambulatory Visit: Payer: Self-pay

## 2018-05-14 ENCOUNTER — Encounter (HOSPITAL_COMMUNITY): Payer: Self-pay | Admitting: Emergency Medicine

## 2018-05-14 ENCOUNTER — Emergency Department (HOSPITAL_COMMUNITY)
Admission: EM | Admit: 2018-05-14 | Discharge: 2018-05-14 | Disposition: A | Discharge status: Home / Self Care | Payer: Medicaid Other | Attending: Emergency Medicine | Admitting: Emergency Medicine

## 2018-05-14 DIAGNOSIS — R519 Headache, unspecified: Secondary | ICD-10-CM

## 2018-05-14 DIAGNOSIS — B182 Chronic viral hepatitis C: Secondary | ICD-10-CM

## 2018-05-14 DIAGNOSIS — G501 Atypical facial pain: Secondary | ICD-10-CM | POA: Diagnosis present

## 2018-05-14 DIAGNOSIS — R51 Headache: Secondary | ICD-10-CM | POA: Diagnosis not present

## 2018-05-14 DIAGNOSIS — B20 Human immunodeficiency virus [HIV] disease: Secondary | ICD-10-CM | POA: Insufficient documentation

## 2018-05-14 DIAGNOSIS — Z79899 Other long term (current) drug therapy: Secondary | ICD-10-CM | POA: Diagnosis not present

## 2018-05-14 DIAGNOSIS — F17210 Nicotine dependence, cigarettes, uncomplicated: Secondary | ICD-10-CM | POA: Insufficient documentation

## 2018-05-14 DIAGNOSIS — I10 Essential (primary) hypertension: Secondary | ICD-10-CM | POA: Diagnosis not present

## 2018-05-14 DIAGNOSIS — H9201 Otalgia, right ear: Secondary | ICD-10-CM | POA: Diagnosis not present

## 2018-05-14 DIAGNOSIS — F1920 Other psychoactive substance dependence, uncomplicated: Secondary | ICD-10-CM | POA: Insufficient documentation

## 2018-05-14 LAB — T-HELPER CELL (CD4) - (RCID CLINIC ONLY)
CD4 T CELL HELPER: 12 % — AB (ref 33–55)
CD4 T Cell Abs: 430 /uL (ref 400–2700)

## 2018-05-14 NOTE — ED Provider Notes (Signed)
[facility] Provider Note   CSN: [id number] Arrival date & time: 05/14/18  2023     History   Chief Complaint Chief Complaint  Patient presents with  . Otalgia    HPI Courtney Ellis is a 58 y.o. female presenting for evaluation of right-sided facial pain.  Patient states that the past 8 months, she has been having intermittent episodes of right-sided facial pain.  Pain begins in her palate, and extends to her jaw, ear, throat, and head.  When pain occurs, last for approximately an hour before resolving.  Patient reports associated eye tearing and nasal rhinorrhea when she develops her pain.  She is tried Tylenol, ibuprofen, and Excedrin without improvement of her symptoms.  She has followed up with ENT and her PCP without improvement.  Patient is concerned this is cancer.   Additional history obtained from chart review.  Patient has been evaluated by ENT during which she had a flexible laryngoscopy for evaluation. ENT with no concerns for cancer at that time.  Patient with a history of HIV, hep C, hypertension, anxiety, and heroin use.  HPI  Past Medical History:  Diagnosis Date  . Anxiety   . Depression   . Hepatitis    C  . Heroin abuse (HCC)   . HIV infection (HCC)   . Hypertension     Patient Active Problem List   Diagnosis Date Noted  . Insomnia 01/12/2018  . Panic anxiety syndrome 01/12/2018  . Liver fibrosis 10/06/2017  . Screening examination for venereal disease 04/27/2017  . Congestion of both ears 03/26/2017  . Heroin abuse (HCC) 10/04/2016  . Essential hypertension 08/03/2007  . Chronic hepatitis C without hepatic coma (HCC) 11/24/2006  . Major depressive disorder, recurrent episode (HCC) 07/28/2006  . Human immunodeficiency virus (HIV) disease (HCC) 05/15/2006    Past Surgical History:  Procedure Laterality Date  . CESAREAN SECTION    . DILATION AND CURETTAGE OF UTERUS N/A 06/27/2016   Procedure: Removal Erasmo LeventhalForeighn  Body Vagina;  Surgeon: Myna HidalgoJennifer Ozan, DO;  Location: [facility];  Service: Gynecology;  Laterality: N/A;  . I&D EXTREMITY Left 09/30/2016   Procedure: IRRIGATION AND DEBRIDEMENT LEFT WRIST AND HAND;  Surgeon: Dominica SeverinGramig, William, MD;  Location: [facility];  Service: Orthopedics;  Laterality: Left;     OB History    Gravida  3   Para  3   Term  3   Preterm      AB      Living  3     SAB      TAB      Ectopic      Multiple      Live Births               Home Medications    Prior to Admission medications   Medication Sig Start Date End Date Taking? Authorizing Provider  abacavir-dolutegravir-lamiVUDine (TRIUMEQ) 600-50-300 MG tablet Take 1 tablet by mouth daily. 02/04/18   Gardiner Barefootomer, Robert W, MD  albuterol (PROVENTIL HFA) 108 (90 Base) MCG/ACT inhaler INHALE TWO PUFFS INTO THE LUNGS EVERY 6 HOURS AS NEEDED FOR WHEEZING OR SHORTNESS OF BREATH 05/07/18   Comer, Belia Hemanobert W, MD  amLODipine (NORVASC) 5 MG tablet TAKE 1 TABLET (5 MG TOTAL) BY MOUTH DAILY. 02/04/18   Comer, Belia Hemanobert W, MD  cetirizine (ZYRTEC) 10 MG tablet TAKE 1 TABLET (10 MG TOTAL) BY MOUTH DAILY. 10/10/16   Gardiner Barefootomer, Robert W, MD  darunavir-cobicistat (PREZCOBIX) 800-150 MG tablet TAKE 1  TABLET BY MOUTH DAILY. SWALLOW WHOLE. DO NOT CRUSH, BREAK OR CHEW TABLETS. TAKE WITH FOOD. 04/22/18   Comer, Belia Heman, MD  diazepam (VALIUM) 5 MG tablet Take 1 tablet (5 mg total) by mouth at bedtime as needed for anxiety. 03/11/17   Gardiner Barefoot, MD  doxylamine, Sleep, (UNISOM) 25 MG tablet Take 1 tablet (25 mg total) by mouth at bedtime as needed. 03/27/18   Arthor Captain, PA-C  meloxicam (MOBIC) 15 MG tablet Take 1 tablet (15 mg total) by mouth daily. Take 1 daily with food. 03/27/18   Arthor Captain, PA-C  methocarbamol (ROBAXIN) 500 MG tablet Take 1 tablet (500 mg total) by mouth 3 (three) times daily as needed for muscle spasms. 03/27/18   Harris, Cammy Copa, PA-C  oxyCODONE (OXY IR/ROXICODONE) 5 MG immediate release tablet Take 1-2 tablets (5-10  mg total) by mouth every 6 (six) hours as needed for moderate pain or severe pain (1 tab for moderate pain and 2 tabs for severe pain). 10/04/16   Calvert Cantor, MD  Sofosbuvir-Velpatasvir (EPCLUSA) 400-100 MG TABS Take 1 tablet by mouth daily. 02/09/18   Kuppelweiser, Cassie L, RPH-CPP    Family History Family History  Problem Relation Age of Onset  . Hypertension Mother   . Alcohol abuse Mother   . Depression Mother   . Diabetes Mother   . Drug abuse Mother   . Alcohol abuse Father   . Cancer Father   . Depression Father   . Drug abuse Father   . Depression Sister   . Diabetes Sister   . Drug abuse Sister   . Mental illness Sister   . Depression Brother   . Drug abuse Brother   . Mental illness Brother   . Depression Maternal Aunt   . Drug abuse Maternal Aunt   . Depression Maternal Uncle   . Drug abuse Maternal Uncle   . Depression Paternal Aunt   . Diabetes Paternal Aunt   . Drug abuse Paternal Aunt   . Stroke Paternal Aunt   . Alcohol abuse Paternal Uncle   . Depression Paternal Uncle   . Diabetes Paternal Uncle   . Drug abuse Paternal Uncle   . Kidney disease Paternal Uncle     Social History Social History   Tobacco Use  . Smoking status: Current Every Day Smoker    Packs/day: 0.20    Types: Cigarettes  . Smokeless tobacco: Never Used  . Tobacco comment: 3 cigarettes a day  Substance Use Topics  . Alcohol use: No    Alcohol/week: 0.0 standard drinks  . Drug use: Yes    Types: IV    Comment: heroin     Allergies   Patient has no known allergies.   Review of Systems Review of Systems  HENT:       Intermittent right-sided head, ear, throat, palate, and facial pain  All other systems reviewed and are negative.    Physical Exam Updated Vital Signs BP (!) 139/93 (BP Location: Left Arm)   Pulse 95   Temp 98.2 F (36.8 C) (Oral)   Resp 18   LMP 09/08/2011   SpO2 97%   Physical Exam Vitals signs and nursing note reviewed.  Constitutional:        General: She is not in acute distress.    Appearance: She is well-developed.     Comments: Sitting in the bed in no acute distress  HENT:     Head: Normocephalic and atraumatic.     Comments:  OP clear without tonsillar swelling or exudate.  Uvula midline with equal palate rise.  TMs nonerythematous nonbulging bilaterally.  No tenderness palpation of the temple. Eyes:     Conjunctiva/sclera: Conjunctivae normal.     Pupils: Pupils are equal, round, and reactive to light.  Neck:     Musculoskeletal: Normal range of motion and neck supple.  Cardiovascular:     Rate and Rhythm: Normal rate and regular rhythm.  Pulmonary:     Effort: Pulmonary effort is normal. No respiratory distress.     Breath sounds: Normal breath sounds. No wheezing.  Abdominal:     General: Bowel sounds are normal. There is no distension.     Palpations: Abdomen is soft.     Tenderness: There is no abdominal tenderness.  Musculoskeletal: Normal range of motion.  Lymphadenopathy:     Cervical: No cervical adenopathy.  Skin:    General: Skin is warm and dry.     Capillary Refill: Capillary refill takes less than 2 seconds.  Neurological:     Mental Status: She is alert and oriented to person, place, and time.     Comments: CN intact      ED Treatments / Results  Labs (all labs ordered are listed, but only abnormal results are displayed) Labs Reviewed - No data to display  EKG None  Radiology No results found.  Procedures Procedures (including critical care time)  Medications Ordered in ED Medications - No data to display   Initial Impression / Assessment and Plan / ED Course  I have reviewed the triage vital signs and the nursing notes.  Pertinent labs & imaging results that were available during my care of the patient were reviewed by me and considered in my medical decision making (see chart for details).     Presenting for evaluation of intermittent right-sided facial pain.  This is  been going on for 8 months, increasing in frequency but not changing in symptoms.  Currently without symptoms.  Exam is reassuring, no signs of infection.  Cranial nerves intact.  No tenderness palpation of the temple, doubt GCA.  Considering associated rhinorrhea and eye tearing, possibly having cluster headaches.  Also consider trigeminal neuralgia, although not well defined and pain is very intermittent.  At this time, I do not believe she needs head CT or MRI, as low suspicion for acute emergent condition such as CVA, TIA, or infection.  Discussed with patient.  Discussed importance of follow-up with PCP.  Given information to follow-up with neurology as needed for further evaluation.  At this time, patient appears safe for discharge.  Return precautions given.  Patient states she understands agrees plan.   Final Clinical Impressions(s) / ED Diagnoses   Final diagnoses:  Right sided facial pain    ED Discharge Orders    None       Alveria ApleyCaccavale, Marjorie Lussier, PA-C 05/14/18 2259    Vanetta MuldersZackowski, Scott, MD 05/16/18 [id number]

## 2018-05-14 NOTE — ED Triage Notes (Addendum)
Reports R ear pain and pain behind R ear x 8 months that is getting worse.  Pt saw ENT in August for same.

## 2018-05-14 NOTE — ED Notes (Signed)
Patient Alert and oriented to baseline. Stable and ambulatory to baseline. Patient verbalized understanding of the discharge instructions.  Patient belongings were taken by the patient.   

## 2018-05-14 NOTE — Discharge Instructions (Addendum)
There were no obvious signs of infection today. I recommend you follow-up with your primary care doctor and/or neurology for further evaluation and management of your symptoms. Return to the emergency room with any new, worsening, concerning symptoms.

## 2018-05-14 NOTE — Progress Notes (Signed)
HPI: Courtney Ellis is a 58 y.o. female who presents to the [facility] pharmacy clinic for follow-up of Hepatitis C on Epclusa x12 weeks.  Medication: Epclusa x12 weeks  Start Date: Around the 2nd week of December   Hepatitis C Genotype: 1a  Fibrosis Score: F3  Hepatitis C RNA: 15 million on 01/21/2018  Patient Active Problem List   Diagnosis Date Noted  . Insomnia 01/12/2018  . Panic anxiety syndrome 01/12/2018  . Liver fibrosis 10/06/2017  . Screening examination for venereal disease 04/27/2017  . Congestion of both ears 03/26/2017  . Heroin abuse (HCC) 10/04/2016  . Essential hypertension 08/03/2007  . Chronic hepatitis C without hepatic coma (HCC) 11/24/2006  . Major depressive disorder, recurrent episode (HCC) 07/28/2006  . Human immunodeficiency virus (HIV) disease (HCC) 05/15/2006    Patient's Medications  New Prescriptions   No medications on file  Previous Medications   ABACAVIR-DOLUTEGRAVIR-LAMIVUDINE (TRIUMEQ) 600-50-300 MG TABLET    Take 1 tablet by mouth daily.   ALBUTEROL (PROVENTIL HFA) 108 (90 BASE) MCG/ACT INHALER    INHALE TWO PUFFS INTO THE LUNGS EVERY 6 HOURS AS NEEDED FOR WHEEZING OR SHORTNESS OF BREATH   AMLODIPINE (NORVASC) 5 MG TABLET    TAKE 1 TABLET (5 MG TOTAL) BY MOUTH DAILY.   CETIRIZINE (ZYRTEC) 10 MG TABLET    TAKE 1 TABLET (10 MG TOTAL) BY MOUTH DAILY.   DARUNAVIR-COBICISTAT (PREZCOBIX) 800-150 MG TABLET    TAKE 1 TABLET BY MOUTH DAILY. SWALLOW WHOLE. DO NOT CRUSH, BREAK OR CHEW TABLETS. TAKE WITH FOOD.   DIAZEPAM (VALIUM) 5 MG TABLET    Take 1 tablet (5 mg total) by mouth at bedtime as needed for anxiety.   DOXYLAMINE, SLEEP, (UNISOM) 25 MG TABLET    Take 1 tablet (25 mg total) by mouth at bedtime as needed.   MELOXICAM (MOBIC) 15 MG TABLET    Take 1 tablet (15 mg total) by mouth daily. Take 1 daily with food.   METHOCARBAMOL (ROBAXIN) 500 MG TABLET    Take 1 tablet (500 mg total) by mouth 3 (three) times daily as needed for muscle spasms.   OXYCODONE (OXY IR/ROXICODONE) 5 MG IMMEDIATE RELEASE TABLET    Take 1-2 tablets (5-10 mg total) by mouth every 6 (six) hours as needed for moderate pain or severe pain (1 tab for moderate pain and 2 tabs for severe pain).   SOFOSBUVIR-VELPATASVIR (EPCLUSA) 400-100 MG TABS    Take 1 tablet by mouth daily.  Modified Medications   No medications on file  Discontinued Medications   No medications on file    Allergies: No Known Allergies  Past Medical History: Past Medical History:  Diagnosis Date  . Anxiety   . Depression   . Hepatitis    C  . Heroin abuse (HCC)   . HIV infection (HCC)   . Hypertension     Social History: Social History   Socioeconomic History  . Marital status: Widowed    Spouse name: Not on file  . Number of children: Not on file  . Years of education: Not on file  . Highest education level: Not on file  Occupational History  . Not on file  Social Needs  . Financial resource strain: Not on file  . Food insecurity:    Worry: Not on file    Inability: Not on file  . Transportation needs:    Medical: Not on file    Non-medical: Not on file  Tobacco Use  . Smoking status:  Current Every Day Smoker    Packs/day: 0.20    Types: Cigarettes  . Smokeless tobacco: Never Used  . Tobacco comment: 3 cigarettes a day  Substance and Sexual Activity  . Alcohol use: No    Alcohol/week: 0.0 standard drinks  . Drug use: Yes    Types: IV    Comment: heroin  . Sexual activity: Never    Partners: Male    Comment: pt. declined condoms  Lifestyle  . Physical activity:    Days per week: Not on file    Minutes per session: Not on file  . Stress: Not on file  Relationships  . Social connections:    Talks on phone: Not on file    Gets together: Not on file    Attends religious service: Not on file    Active member of club or organization: Not on file    Attends meetings of clubs or organizations: Not on file    Relationship status: Not on file  Other Topics  Concern  . Not on file  Social History Narrative  . Not on file    Labs: Hepatitis C Lab Results  Component Value Date   HCVGENOTYPE 1a 09/10/2017   HCVRNAPCRQN 15,000,000 (H) 01/21/2018   HCVRNAPCRQN 21,700,000 (H) 09/10/2017   FIBROSTAGE F3 09/10/2017   Hepatitis B Lab Results  Component Value Date   HEPBSAB NO 06/15/2006   HEPBSAG NON-REACTIVE 09/10/2017   Hepatitis A Lab Results  Component Value Date   HAV NEG 10/25/2009   HIV No results found for: HIV Lab Results  Component Value Date   CREATININE 1.39 (H) 09/10/2017   CREATININE 0.88 04/16/2017   CREATININE 0.74 10/01/2016   CREATININE 0.73 09/30/2016   CREATININE 0.74 09/29/2016   Lab Results  Component Value Date   AST 29 09/10/2017   AST 59 (H) 04/16/2017   AST 35 09/29/2016   ALT 22 09/10/2017   ALT 24 09/10/2017   ALT 38 (H) 04/16/2017   INR 1.0 09/10/2017    Assessment: Courtney Ellis presents today for a pharmacy visit to follow-up for HepC on Epclusa x12 weeks. She also takes Triumeq and Prezcobix for HIV infection. She was initially supposed to begin treatment with Epclusa in late October but delayed the start of the medication several times. In mid November, when calling to check up on her, she stated that she was worried about experiencing bad side effects. She delayed again because she was worried she wouldn't get all her refills. She finally states she began treatment around the second week of December. She does have all three bottles of the medication and states that she has started her second bottle of medication. She states that it does make her very fatigued and that she has also experienced some nausea. She does not report any missed doses of any of her medications since she has started them.  She is tearful today in describing a tingling sensation and pain that she experiences in the back of her throat up to her ear. She has previously seen an ENT, dentist, and was most recently referred to a  neurologist by Dr. Luciana Axeomer. She states that each of these doctors have told her there is nothing going on that they can tell. She says that she is worried it could be cancer, as it runs in her family. She states that she has been experiencing this pain for the past 8-9 months now but it is becoming more frequent and is no longer relieved by OTC  pain medication.   Will order HCV RNA, HIV RNA, CD4. Although she will not be finished with her Dorita Fray until about mid March, she wishes to keep her appointment with Dr. Luciana Axe on 2/4. Told her that if she would like to be seen sooner, she can stop at the front desk and make an earlier appointment if necessary.    Plan: -HCV RNA, HIV RNA, CD4 -Continue Epclusa -Continue Triumeq and Prezcobix - Follow-up with Dr. Luciana Axe on 2/4 at 10:30  Lawernce Keas [employer] Student [facility] 05/14/2018, 11:07 AM

## 2018-05-17 ENCOUNTER — Other Ambulatory Visit: Payer: Self-pay | Admitting: Behavioral Health

## 2018-05-17 DIAGNOSIS — G8929 Other chronic pain: Secondary | ICD-10-CM

## 2018-05-17 DIAGNOSIS — H9201 Otalgia, right ear: Principal | ICD-10-CM

## 2018-05-18 LAB — HEPATITIS C RNA QUANTITATIVE
HCV Quantitative Log: <1.18 Log IU/mL
HCV RNA, PCR, QN: <15 IU/mL

## 2018-05-18 LAB — HIV-1 RNA QUANT-NO REFLEX-BLD
HIV 1 RNA Quant: 24 copies/mL — ABNORMAL HIGH
HIV-1 RNA QUANT, LOG: 1.38 {Log_copies}/mL — AB

## 2018-05-19 ENCOUNTER — Telehealth: Payer: Self-pay | Admitting: *Deleted

## 2018-05-19 NOTE — Telephone Encounter (Signed)
-----   Message from Milagros Reap sent at 05/19/2018  2:55 PM EST ----- Regarding: RETURN CALL Courtney Ellis called [phone] would like a call back

## 2018-05-19 NOTE — Telephone Encounter (Signed)
Natania wanted to let Ladona Ridgel and Dr Luciana Axe know that she has had a death in a family will be out of town Thursday - Sunday.  She will meet Ladona Ridgel after her appointment Monday with Dr Luciana Axe.  She will need to reschedule the housing authority visit.  Ladona Ridgel is in agreement. She feels her episodes are increasing in intensity and frequency.  She used to be able to control the pain with chewable childrens aspirin, bc poweders, aleve, and advil, but now none of these are working.  RN encouraged her to keep the neurology appointment (she will confirm once she's back in town) to help figure out what is happening to her jaw/ear/throat.  She agreed, stating this is very important to her. Andree Coss, RN

## 2018-05-24 ENCOUNTER — Encounter: Payer: Self-pay | Admitting: Internal Medicine

## 2018-05-24 ENCOUNTER — Ambulatory Visit (INDEPENDENT_AMBULATORY_CARE_PROVIDER_SITE_OTHER): Payer: Medicaid Other | Admitting: Internal Medicine

## 2018-05-24 VITALS — BP 103/72 | HR 92 | Temp 97.9°F | Wt 135.0 lb

## 2018-05-24 DIAGNOSIS — K7400 Hepatic fibrosis, unspecified: Secondary | ICD-10-CM

## 2018-05-24 DIAGNOSIS — B182 Chronic viral hepatitis C: Secondary | ICD-10-CM | POA: Diagnosis not present

## 2018-05-24 DIAGNOSIS — B20 Human immunodeficiency virus [HIV] disease: Secondary | ICD-10-CM

## 2018-05-24 DIAGNOSIS — F1110 Opioid abuse, uncomplicated: Secondary | ICD-10-CM

## 2018-05-25 ENCOUNTER — Ambulatory Visit: Payer: Medicaid Other | Admitting: Internal Medicine

## 2018-05-25 NOTE — Assessment & Plan Note (Signed)
Continues to try to remain off of drugs.

## 2018-05-25 NOTE — Progress Notes (Signed)
   Subjective:    Patient ID: Courtney Ellis, female    DOB: 07-31-1960, 58 y.o.   MRN: [id number]  HPI Here for follow up of HIV She is here for follow up of HIV and chronic hepatitis C.  She startred Epclusa for 12 weeks and tolerating well. Her early treatment response shows a negative HCV RNA.  She has been off of heroin and tobacco since last week and is feeling very good, pleased with how she is doing.  She continues to try to be committed to becoming drug and tobacco free.  No associated fatigue or new issues.      Review of Systems  Constitutional: Negative for fatigue.  Psychiatric/Behavioral: Negative for suicidal ideas.       Objective:   Physical Exam Constitutional:      Appearance: She is well-developed.  Eyes:     General: No scleral icterus. Cardiovascular:     Rate and Rhythm: Normal rate and regular rhythm.     Heart sounds: Normal heart sounds. No murmur.  Pulmonary:     Effort: Pulmonary effort is normal. No respiratory distress.  Lymphadenopathy:     Cervical: No cervical adenopathy.  Skin:    Findings: No erythema.    SH: off of drugs at this time       Assessment & Plan:

## 2018-05-25 NOTE — Assessment & Plan Note (Signed)
Doing well on treatment.  Will return in 3 months and recheck EOT lab.

## 2018-05-25 NOTE — Assessment & Plan Note (Signed)
Moderate fibrosis.  No inidcation for HCC screening

## 2018-05-25 NOTE — Assessment & Plan Note (Signed)
Doing well.  Good CD4 and suppressed virus.  rtc 3 months

## 2018-07-08 ENCOUNTER — Other Ambulatory Visit: Payer: Self-pay | Admitting: Internal Medicine

## 2018-07-08 DIAGNOSIS — I10 Essential (primary) hypertension: Secondary | ICD-10-CM

## 2018-07-08 DIAGNOSIS — B20 Human immunodeficiency virus [HIV] disease: Secondary | ICD-10-CM

## 2018-08-11 ENCOUNTER — Other Ambulatory Visit: Payer: Self-pay | Admitting: Internal Medicine

## 2018-08-11 DIAGNOSIS — R0602 Shortness of breath: Secondary | ICD-10-CM

## 2018-08-13 DIAGNOSIS — H2511 Age-related nuclear cataract, right eye: Secondary | ICD-10-CM | POA: Diagnosis not present

## 2018-08-13 DIAGNOSIS — H538 Other visual disturbances: Secondary | ICD-10-CM | POA: Diagnosis not present

## 2018-08-16 ENCOUNTER — Other Ambulatory Visit: Payer: Medicaid Other

## 2018-09-02 ENCOUNTER — Telehealth: Payer: Self-pay | Admitting: Internal Medicine

## 2018-09-02 NOTE — Telephone Encounter (Signed)
COVID-19 Pre-Screening Questions: ° °Do you currently have a fever (>100 °F), chills or unexplained body aches? No  ° °Are you currently experiencing new cough, shortness of breath, sore throat, runny nose? No  °•  °Have you recently travelled outside the state of  in the last 14 days? No  °•  °Have you been in contact with someone that is currently pending confirmation of Covid19 testing or has been confirmed to have the Covid19 virus?  No  °

## 2018-09-06 ENCOUNTER — Encounter: Payer: Self-pay | Admitting: Internal Medicine

## 2018-09-06 ENCOUNTER — Ambulatory Visit (INDEPENDENT_AMBULATORY_CARE_PROVIDER_SITE_OTHER): Payer: Medicaid Other | Admitting: Internal Medicine

## 2018-09-06 ENCOUNTER — Other Ambulatory Visit: Payer: Self-pay

## 2018-09-06 VITALS — BP 112/77 | HR 82 | Temp 98.0°F | Ht 64.0 in | Wt 149.0 lb

## 2018-09-06 DIAGNOSIS — B20 Human immunodeficiency virus [HIV] disease: Secondary | ICD-10-CM

## 2018-09-06 DIAGNOSIS — B182 Chronic viral hepatitis C: Secondary | ICD-10-CM

## 2018-09-06 DIAGNOSIS — Z23 Encounter for immunization: Secondary | ICD-10-CM | POA: Diagnosis not present

## 2018-09-06 NOTE — Assessment & Plan Note (Signed)
Will check CD4 and viral load.  Continues to be very compliant.

## 2018-09-06 NOTE — Assessment & Plan Note (Signed)
Will check EOT lab today.  rtc 4 months for SVR 12

## 2018-09-06 NOTE — Assessment & Plan Note (Signed)
Counseled on vaccine and given today 

## 2018-09-06 NOTE — Progress Notes (Signed)
   Subjective:    Patient ID: Courtney Ellis, female    DOB: 09/14/1960, 58 y.o.   MRN: [id number]  HPI Here for follow up of HIV She is here for follow up of HIV and chronic hepatitis C.  She startred Epclusa for 12 weeks and now completed it. Her early treatment response showed a negative HCV RNA.  She has been off and on heroin but feeling very good, pleased with how she is doing.  She continues to try to be committed to becoming drug and tobacco free.  No associated fatigue or new issues.  Does not ever share needles.  Working now at [employer], saving money.  Recently spent her stimulus check on advanced payment of bills rather than drugs.     Review of Systems  Constitutional: Negative for fatigue.  Skin: Negative for rash.  Neurological: Negative for dizziness.  Psychiatric/Behavioral: Negative for suicidal ideas.       Objective:   Physical Exam Constitutional:      Appearance: She is well-developed.  Eyes:     General: No scleral icterus. Cardiovascular:     Rate and Rhythm: Normal rate and regular rhythm.     Heart sounds: Normal heart sounds. No murmur.  Pulmonary:     Effort: Pulmonary effort is normal. No respiratory distress.  Lymphadenopathy:     Cervical: No cervical adenopathy.  Skin:    Findings: No erythema.    SH: on heroin some, going to methadone clinic next week       Assessment & Plan:

## 2018-09-07 ENCOUNTER — Other Ambulatory Visit: Payer: Medicaid Other

## 2018-09-07 DIAGNOSIS — B20 Human immunodeficiency virus [HIV] disease: Secondary | ICD-10-CM

## 2018-09-08 LAB — T-HELPER CELL (CD4) - (RCID CLINIC ONLY)
CD4 % Helper T Cell: 13 % — ABNORMAL LOW (ref 33–65)
CD4 T Cell Abs: 361 /uL — ABNORMAL LOW (ref 400–1790)

## 2018-09-10 DIAGNOSIS — G44099 Other trigeminal autonomic cephalgias (TAC), not intractable: Secondary | ICD-10-CM | POA: Diagnosis not present

## 2018-09-10 DIAGNOSIS — H9201 Otalgia, right ear: Secondary | ICD-10-CM | POA: Diagnosis not present

## 2018-09-11 LAB — COMPLETE METABOLIC PANEL WITH GFR
AG Ratio: 0.9 (calc) — ABNORMAL LOW (ref 1.0–2.5)
ALT: 16 U/L (ref 6–29)
AST: 25 U/L (ref 10–35)
Albumin: 3.8 g/dL (ref 3.6–5.1)
Alkaline phosphatase (APISO): 64 U/L (ref 37–153)
BUN: 16 mg/dL (ref 7–25)
CO2: 28 mmol/L (ref 20–32)
Calcium: 10.3 mg/dL (ref 8.6–10.4)
Chloride: 101 mmol/L (ref 98–110)
Creat: 0.98 mg/dL (ref 0.50–1.05)
GFR, Est African American: 75 mL/min/{1.73_m2} (ref >=60)
GFR, Est Non African American: 64 mL/min/{1.73_m2} (ref >=60)
Globulin: 4.2 g/dL (calc) — ABNORMAL HIGH (ref 1.9–3.7)
Glucose, Bld: 119 mg/dL — ABNORMAL HIGH (ref 65–99)
Potassium: 4.6 mmol/L (ref 3.5–5.3)
Sodium: 135 mmol/L (ref 135–146)
Total Bilirubin: 0.4 mg/dL (ref 0.2–1.2)
Total Protein: 8 g/dL (ref 6.1–8.1)

## 2018-09-11 LAB — HIV-1 RNA QUANT-NO REFLEX-BLD
HIV 1 RNA Quant: 55 copies/mL — ABNORMAL HIGH
HIV-1 RNA Quant, Log: 1.74 Log copies/mL — ABNORMAL HIGH

## 2018-09-11 LAB — HEPATITIS C RNA QUANTITATIVE
HCV Quantitative Log: <1.18 Log IU/mL
HCV RNA, PCR, QN: <15 IU/mL

## 2018-10-01 ENCOUNTER — Other Ambulatory Visit: Payer: Self-pay | Admitting: Internal Medicine

## 2018-10-01 DIAGNOSIS — R0602 Shortness of breath: Secondary | ICD-10-CM

## 2018-11-02 DIAGNOSIS — Z03818 Encounter for observation for suspected exposure to other biological agents ruled out: Secondary | ICD-10-CM | POA: Diagnosis not present

## 2018-11-10 ENCOUNTER — Other Ambulatory Visit: Payer: Self-pay | Admitting: Internal Medicine

## 2018-11-10 DIAGNOSIS — R0602 Shortness of breath: Secondary | ICD-10-CM

## 2018-11-18 DIAGNOSIS — H5213 Myopia, bilateral: Secondary | ICD-10-CM | POA: Diagnosis not present

## 2018-12-01 ENCOUNTER — Other Ambulatory Visit: Payer: Self-pay | Admitting: Internal Medicine

## 2018-12-01 DIAGNOSIS — R0602 Shortness of breath: Secondary | ICD-10-CM

## 2018-12-02 DIAGNOSIS — H52223 Regular astigmatism, bilateral: Secondary | ICD-10-CM | POA: Diagnosis not present

## 2019-01-03 DIAGNOSIS — Z03818 Encounter for observation for suspected exposure to other biological agents ruled out: Secondary | ICD-10-CM | POA: Diagnosis not present

## 2019-01-05 ENCOUNTER — Other Ambulatory Visit: Payer: Self-pay | Admitting: Internal Medicine

## 2019-01-05 DIAGNOSIS — I10 Essential (primary) hypertension: Secondary | ICD-10-CM

## 2019-01-05 DIAGNOSIS — B20 Human immunodeficiency virus [HIV] disease: Secondary | ICD-10-CM

## 2019-01-24 ENCOUNTER — Other Ambulatory Visit: Payer: Medicaid Other

## 2019-01-26 DIAGNOSIS — Z5181 Encounter for therapeutic drug level monitoring: Secondary | ICD-10-CM | POA: Diagnosis not present

## 2019-02-01 ENCOUNTER — Other Ambulatory Visit: Payer: Self-pay

## 2019-02-01 ENCOUNTER — Encounter: Payer: Self-pay | Admitting: Infectious Diseases

## 2019-02-01 ENCOUNTER — Ambulatory Visit (INDEPENDENT_AMBULATORY_CARE_PROVIDER_SITE_OTHER): Payer: Medicaid Other | Admitting: Infectious Diseases

## 2019-02-01 ENCOUNTER — Ambulatory Visit
Admission: RE | Admit: 2019-02-01 | Discharge: 2019-02-01 | Disposition: A | Discharge status: Home / Self Care | Payer: Medicaid Other | Source: Ambulatory Visit | Attending: Infectious Diseases | Admitting: Infectious Diseases

## 2019-02-01 ENCOUNTER — Other Ambulatory Visit (HOSPITAL_COMMUNITY)
Admission: RE | Admit: 2019-02-01 | Discharge: 2019-02-01 | Disposition: A | Discharge status: Home / Self Care | Payer: Medicaid Other | Source: Ambulatory Visit | Attending: Infectious Diseases | Admitting: Infectious Diseases

## 2019-02-01 ENCOUNTER — Other Ambulatory Visit: Payer: Medicaid Other

## 2019-02-01 VITALS — BP 150/83 | HR 65 | Temp 98.0°F | Ht 63.0 in | Wt 138.0 lb

## 2019-02-01 DIAGNOSIS — B182 Chronic viral hepatitis C: Secondary | ICD-10-CM

## 2019-02-01 DIAGNOSIS — Z113 Encounter for screening for infections with a predominantly sexual mode of transmission: Secondary | ICD-10-CM

## 2019-02-01 DIAGNOSIS — Z1231 Encounter for screening mammogram for malignant neoplasm of breast: Secondary | ICD-10-CM

## 2019-02-01 DIAGNOSIS — Z124 Encounter for screening for malignant neoplasm of cervix: Secondary | ICD-10-CM | POA: Diagnosis not present

## 2019-02-01 DIAGNOSIS — M25552 Pain in left hip: Secondary | ICD-10-CM

## 2019-02-01 DIAGNOSIS — B20 Human immunodeficiency virus [HIV] disease: Secondary | ICD-10-CM

## 2019-02-01 NOTE — Patient Instructions (Signed)
Nice to meet you today.   I will call you with your results of your pap smear next week when they return.   Will call you once I have your xray results. We may need to send you to sports medicine to help [place] figure out why your groin is hurting.   Please call the [facility] to set up your mammogram.  [phone] [address], [place], [place] [address]

## 2019-02-01 NOTE — Progress Notes (Signed)
      Subjective:    Courtney Ellis is a 58 y.o. female here for an annual pelvic exam and pap smear.   Review of Systems: Current GYN complaints or concerns: Left sided groin pain that has been going on for a few weeks. She notices it only when she walks. No pain with BMs or with coughing. She does not have regular menses.   Patient denies any abdominal/pelvic pain, problems with bowel movements, urination, vaginal discharge or intercourse.   Past Medical History:  Diagnosis Date  . Anxiety   . Depression   . Hepatitis    C  . Heroin abuse ([place])   . HIV infection ([place])   . Hypertension     Gynecologic History: [id number]  Patient's last menstrual period was 09/08/2011. Contraception: condoms Last Pap: 06/27/2016. Results were: abnormal --> ASCUS, HPV- Anal Intercourse: no Last Mammogram: no records.   Objective:  Physical Exam  Constitutional: Well developed, well nourished, no acute distress. She is alert and oriented x3.  Pelvic: External genitalia is normal in appearance. The vagina is normal in appearance with exception of a small polyp on the vaginal wall in the 2 o'clock position that is uniform skin color and non-bleeding. The cervix is bulbous and easily visualized. No CMT, normal expected cervical mucus present. Bimanual exam reveals uterus that is felt to be normal size, shape, and contour. No adnexal masses or tenderness noted. Breasts: symmetrical in contour, shape and texture. No palpable masses/nodules. No nipple discharge.  Psych: She has a normal mood and affect.    Assessment & Plan:    Problem List Items Addressed This Visit      High   Human immunodeficiency virus (HIV) disease ([place]) (Chronic)    Continue Triumeq and Prezcobix for HIV treatment as directed by Dr. Linus Salmons. Follow up as currently scheduled with him.   Family planning/Contraception: s/p menopause STI Counseling: done - screening cervicovaginal swab collected.         Unprioritized   Encounter for screening mammogram for malignant neoplasm of breast - Primary   Relevant Orders   MM DIGITAL SCREENING BILATERAL   Screening for cervical cancer    Normal pelvic exam. Small (est < 0.25cm) polyp on vaginal wall for which she is asymptomatic. Will monitor at return visit. Discussed recommended screening interval for women living with HIV disease. Will contact the patient to discuss once results of today's test is available.        Relevant Orders   Cervicovaginal ancillary only( [place])   Left hip pain    Normal bimanual exam without any adnexal tenderness. No inguinal hernia appreciated. Given description of her pain and escalation over the last year will check xray of the hip. Recommended supportive shoes at work and tylenol for pain control. Ice as needed should she find that helpful.       Relevant Orders   DG HIP UNILAT WITH PELVIS 2-3 VIEWS LEFT (Completed)     [name], MSN, NP-C [facility] Office: [phone] Pager: [phone]  02/02/19 3:59 PM

## 2019-02-02 ENCOUNTER — Encounter: Payer: Self-pay | Admitting: Infectious Diseases

## 2019-02-02 ENCOUNTER — Telehealth: Payer: Self-pay

## 2019-02-02 DIAGNOSIS — M25552 Pain in left hip: Secondary | ICD-10-CM | POA: Insufficient documentation

## 2019-02-02 DIAGNOSIS — Z124 Encounter for screening for malignant neoplasm of cervix: Secondary | ICD-10-CM | POA: Insufficient documentation

## 2019-02-02 LAB — T-HELPER CELL (CD4) - (RCID CLINIC ONLY)
CD4 % Helper T Cell: 13 % — ABNORMAL LOW (ref 33–65)
CD4 T Cell Abs: 427 /uL (ref 400–1790)

## 2019-02-02 NOTE — Assessment & Plan Note (Signed)
Continue Triumeq and Prezcobix for HIV treatment as directed by Dr. Linus Salmons. Follow up as currently scheduled with him.   Family planning/Contraception: s/p menopause STI Counseling: done - screening cervicovaginal swab collected.

## 2019-02-02 NOTE — Assessment & Plan Note (Signed)
Normal pelvic exam. Small (est < 0.25cm) polyp on vaginal wall for which she is asymptomatic. Will monitor at return visit. Discussed recommended screening interval for women living with HIV disease. Will contact the patient to discuss once results of today's test is available.

## 2019-02-02 NOTE — Telephone Encounter (Signed)
-----   Message from [place] Callas, NP sent at 02/02/2019 11:17 AM EDT ----- Please call Dinah to let her know her hip X-ray shows some mild arthritis that could be explaining her occasional pain. This is due to wear of the joints over time. I would have her make sure she has supportive shoes at work (not worn out/flat soles) and she can use Tylenol for the pain safely. 2 extra strength 1-2 times a day.  Thank you

## 2019-02-02 NOTE — Assessment & Plan Note (Signed)
Normal bimanual exam without any adnexal tenderness. No inguinal hernia appreciated. Given description of her pain and escalation over the last year will check xray of the hip. Recommended supportive shoes at work and tylenol for pain control. Ice as needed should she find that helpful.

## 2019-02-02 NOTE — Telephone Encounter (Signed)
Patient made aware of xray results and recommendations per [name], NP. Patient appreciative of phone call. Courtney Ellis

## 2019-02-03 ENCOUNTER — Other Ambulatory Visit: Payer: Self-pay | Admitting: Internal Medicine

## 2019-02-03 DIAGNOSIS — R0602 Shortness of breath: Secondary | ICD-10-CM

## 2019-02-04 LAB — COMPLETE METABOLIC PANEL WITH GFR
AG Ratio: 1.1 (calc) (ref 1.0–2.5)
ALT: 16 U/L (ref 6–29)
AST: 20 U/L (ref 10–35)
Albumin: 4.2 g/dL (ref 3.6–5.1)
Alkaline phosphatase (APISO): 48 U/L (ref 37–153)
BUN: 14 mg/dL (ref 7–25)
CO2: 26 mmol/L (ref 20–32)
Calcium: 10.1 mg/dL (ref 8.6–10.4)
Chloride: 104 mmol/L (ref 98–110)
Creat: 0.84 mg/dL (ref 0.50–1.05)
GFR, Est African American: 90 mL/min/{1.73_m2} (ref >=60)
GFR, Est Non African American: 78 mL/min/{1.73_m2} (ref >=60)
Globulin: 3.8 g/dL (calc) — ABNORMAL HIGH (ref 1.9–3.7)
Glucose, Bld: 84 mg/dL (ref 65–99)
Potassium: 4.2 mmol/L (ref 3.5–5.3)
Sodium: 139 mmol/L (ref 135–146)
Total Bilirubin: 0.4 mg/dL (ref 0.2–1.2)
Total Protein: 8 g/dL (ref 6.1–8.1)

## 2019-02-04 LAB — HIV-1 RNA QUANT-NO REFLEX-BLD
HIV 1 RNA Quant: 172 copies/mL — ABNORMAL HIGH
HIV-1 RNA Quant, Log: 2.24 Log copies/mL — ABNORMAL HIGH

## 2019-02-04 LAB — HEPATITIS C RNA QUANTITATIVE
HCV Quantitative Log: 1.4 Log IU/mL — ABNORMAL HIGH
HCV RNA, PCR, QN: 25 IU/mL — ABNORMAL HIGH

## 2019-02-07 ENCOUNTER — Ambulatory Visit (INDEPENDENT_AMBULATORY_CARE_PROVIDER_SITE_OTHER): Payer: Medicaid Other | Admitting: Internal Medicine

## 2019-02-07 ENCOUNTER — Other Ambulatory Visit: Payer: Self-pay

## 2019-02-07 ENCOUNTER — Encounter: Payer: Self-pay | Admitting: Internal Medicine

## 2019-02-07 VITALS — BP 112/76 | HR 76 | Temp 98.3°F | Wt 154.0 lb

## 2019-02-07 DIAGNOSIS — Z23 Encounter for immunization: Secondary | ICD-10-CM | POA: Diagnosis not present

## 2019-02-07 DIAGNOSIS — B182 Chronic viral hepatitis C: Secondary | ICD-10-CM

## 2019-02-07 DIAGNOSIS — Z113 Encounter for screening for infections with a predominantly sexual mode of transmission: Secondary | ICD-10-CM

## 2019-02-07 DIAGNOSIS — B20 Human immunodeficiency virus [HIV] disease: Secondary | ICD-10-CM

## 2019-02-07 DIAGNOSIS — Z79899 Other long term (current) drug therapy: Secondary | ICD-10-CM | POA: Diagnosis not present

## 2019-02-07 DIAGNOSIS — Z720 Tobacco use: Secondary | ICD-10-CM | POA: Insufficient documentation

## 2019-02-07 DIAGNOSIS — F1110 Opioid abuse, uncomplicated: Secondary | ICD-10-CM | POA: Diagnosis not present

## 2019-02-07 LAB — CERVICOVAGINAL ANCILLARY ONLY
Chlamydia: NEGATIVE
Comment: NEGATIVE
Comment: NEGATIVE
Comment: NORMAL
Neisseria Gonorrhea: NEGATIVE
Trichomonas: NEGATIVE

## 2019-02-07 NOTE — Assessment & Plan Note (Signed)
Viral load noted and very low level.  I suspect this is a false reading and is really positive since LFTs also ok.  I will recheck this next visit.

## 2019-02-07 NOTE — Assessment & Plan Note (Signed)
Back on medication and encouarged compliance.   rtc 6 months.

## 2019-02-07 NOTE — Assessment & Plan Note (Signed)
Discussed cessation and given info on the [place] quitline.

## 2019-02-07 NOTE — Progress Notes (Signed)
   Subjective:    Patient ID: Courtney Ellis, female    DOB: 02-14-1961, 58 y.o.   MRN: [id number]  HPI Here for follow up of HIV and chronic hepatitis C She has been on Genvoya but did relapse with IV heroin and had been off of her medications a short time.  Back on now and no new issues.  CD4 remains good at 417 but viral load up to 172.  Otherwise is off of heroin 10 days but smoking more.  Interested in quitting. Some increase congestion.     Review of Systems  Constitutional: Negative for fever.  Respiratory: Positive for chest tightness and shortness of breath. Negative for cough.   Gastrointestinal: Negative for diarrhea.  Skin: Negative for rash.       Objective:   Physical Exam Constitutional:      Appearance: Normal appearance.  Eyes:     General: No scleral icterus. Cardiovascular:     Rate and Rhythm: Normal rate and regular rhythm.     Heart sounds: No murmur.  Pulmonary:     Effort: Pulmonary effort is normal. No respiratory distress.     Breath sounds: Normal breath sounds. No wheezing or rales.  Skin:    Findings: No rash.  Neurological:     Mental Status: She is alert.  Psychiatric:        Mood and Affect: Mood normal.   SH: + tobacco        Assessment & Plan:

## 2019-02-08 LAB — CYTOLOGY - PAP
Chlamydia: NEGATIVE
Comment: NEGATIVE
Comment: NEGATIVE
Comment: NORMAL
High risk HPV: NEGATIVE
Neisseria Gonorrhea: NEGATIVE

## 2019-02-08 NOTE — Progress Notes (Signed)
Please give Courtney Ellis a call to let her know that her pap smear showed some mild changes that we should follow with a repeat test in 1 year. I have updated her health maintenance information.  Thank you!

## 2019-02-09 ENCOUNTER — Telehealth: Payer: Self-pay | Admitting: *Deleted

## 2019-02-09 NOTE — Telephone Encounter (Signed)
-----   Message from [place] Callas, NP sent at 02/08/2019  3:27 PM EDT ----- Please give Courtney Ellis a call to let her know that her pap smear showed some mild changes that we should follow with a repeat test in 1 year. I have updated her health maintenance information.  Thank you!

## 2019-02-09 NOTE — Telephone Encounter (Signed)
Per Courtney Ellis called patient to say the PAP showed mild changes and she needs to have another in 1 year. She advised she understands and will do.

## 2019-02-10 ENCOUNTER — Telehealth: Payer: Self-pay

## 2019-02-10 DIAGNOSIS — Z5181 Encounter for therapeutic drug level monitoring: Secondary | ICD-10-CM | POA: Diagnosis not present

## 2019-02-10 NOTE — Telephone Encounter (Signed)
Patient called office requesting a note for work stating "due to her underlining health issues she is unable to return to work at this time due to covid." Patient does not have a pcp and reports that the [place] she works at has had multiple positive cases.  Will forward message to MD to advise on letter. Courtney Ellis

## 2019-02-11 NOTE — Telephone Encounter (Signed)
If she needs something for her employer, the employer needs to send me the appropriate forms.

## 2019-02-11 NOTE — Telephone Encounter (Signed)
Relayed message to patient; states she will talk to her supervisor. Will call us Monday with any updates. [place]

## 2019-02-15 DIAGNOSIS — Z5181 Encounter for therapeutic drug level monitoring: Secondary | ICD-10-CM | POA: Diagnosis not present

## 2019-02-24 DIAGNOSIS — Z5181 Encounter for therapeutic drug level monitoring: Secondary | ICD-10-CM | POA: Diagnosis not present

## 2019-02-28 ENCOUNTER — Other Ambulatory Visit: Payer: Medicaid Other

## 2019-02-28 ENCOUNTER — Ambulatory Visit: Payer: Medicaid Other | Admitting: Infectious Diseases

## 2019-03-01 ENCOUNTER — Other Ambulatory Visit: Payer: Medicaid Other

## 2019-03-01 ENCOUNTER — Ambulatory Visit: Payer: Medicaid Other | Admitting: Infectious Diseases

## 2019-03-02 DIAGNOSIS — Z5181 Encounter for therapeutic drug level monitoring: Secondary | ICD-10-CM | POA: Diagnosis not present

## 2019-03-09 DIAGNOSIS — Z5181 Encounter for therapeutic drug level monitoring: Secondary | ICD-10-CM | POA: Diagnosis not present

## 2019-03-15 DIAGNOSIS — Z5181 Encounter for therapeutic drug level monitoring: Secondary | ICD-10-CM | POA: Diagnosis not present

## 2019-03-22 DIAGNOSIS — Z5181 Encounter for therapeutic drug level monitoring: Secondary | ICD-10-CM | POA: Diagnosis not present

## 2019-03-29 DIAGNOSIS — Z5181 Encounter for therapeutic drug level monitoring: Secondary | ICD-10-CM | POA: Diagnosis not present

## 2019-04-05 DIAGNOSIS — Z5181 Encounter for therapeutic drug level monitoring: Secondary | ICD-10-CM | POA: Diagnosis not present

## 2019-04-11 ENCOUNTER — Ambulatory Visit: Payer: Medicaid Other

## 2019-04-26 DIAGNOSIS — Z5181 Encounter for therapeutic drug level monitoring: Secondary | ICD-10-CM | POA: Diagnosis not present

## 2019-05-03 DIAGNOSIS — Z5181 Encounter for therapeutic drug level monitoring: Secondary | ICD-10-CM | POA: Diagnosis not present

## 2019-05-11 ENCOUNTER — Other Ambulatory Visit: Payer: Self-pay | Admitting: Internal Medicine

## 2019-05-11 DIAGNOSIS — R0602 Shortness of breath: Secondary | ICD-10-CM

## 2019-06-23 ENCOUNTER — Other Ambulatory Visit: Payer: Self-pay | Admitting: Internal Medicine

## 2019-06-23 DIAGNOSIS — B20 Human immunodeficiency virus [HIV] disease: Secondary | ICD-10-CM

## 2019-06-23 DIAGNOSIS — I10 Essential (primary) hypertension: Secondary | ICD-10-CM

## 2019-06-23 IMAGING — US US ABDOMEN COMPLETE W/ ELASTOGRAPHY
1 series · 13 of 25 positions shown · non-contrast
Comparison: Renal ultrasound 02/23/2014

CLINICAL DATA: Hepatitis-C



[Series 1: us abdomen complete w/ elastography · 0.20mm/px · 13 of 91 slices shown]
[im 1/91]
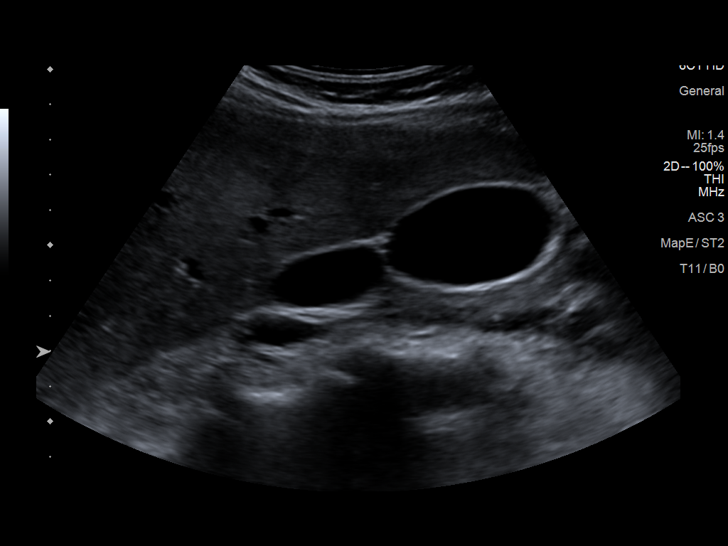
[im 8/91]
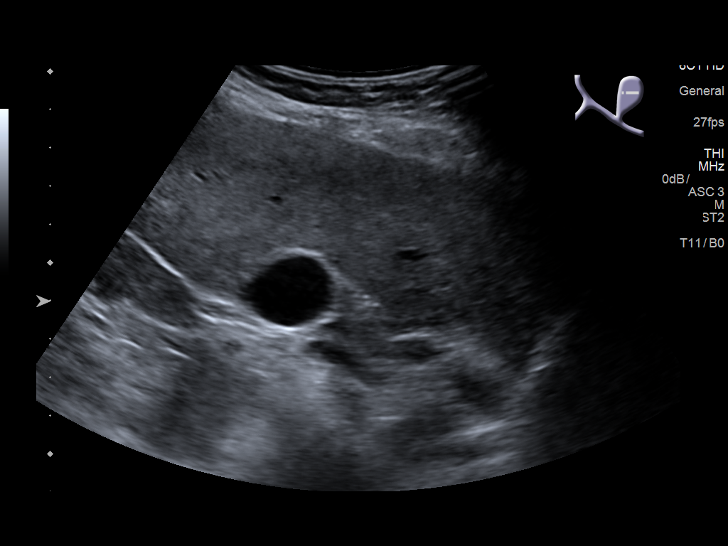
[im 16/91]
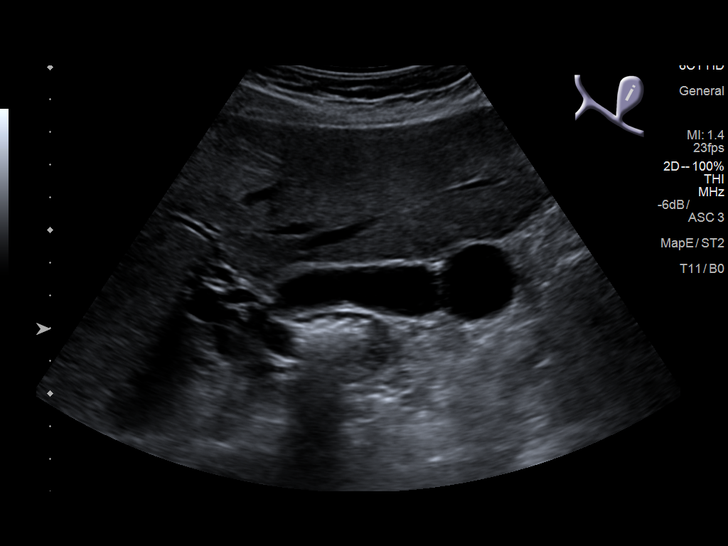
[im 23/91]
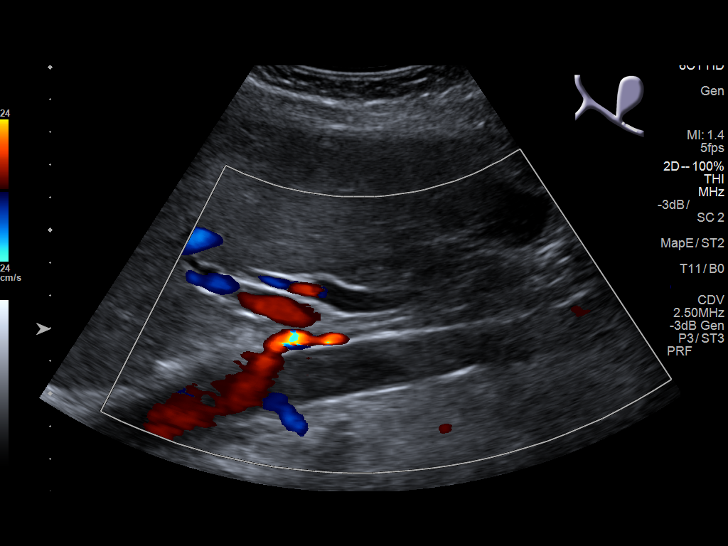
[im 31/91]
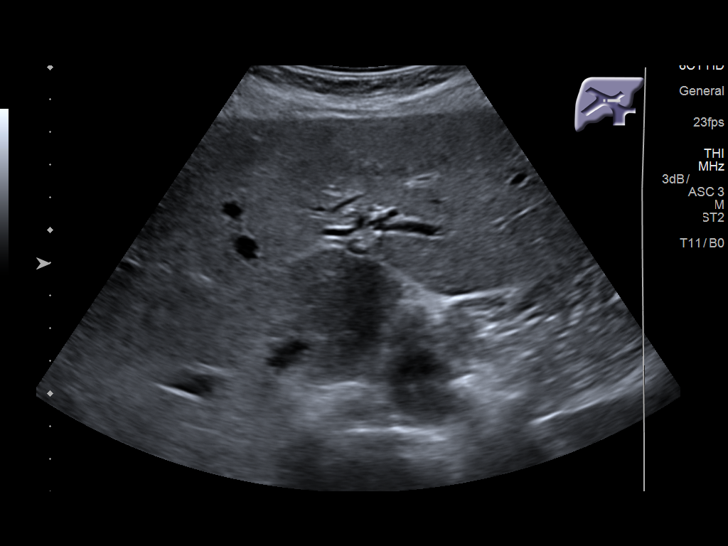
[im 38/91]
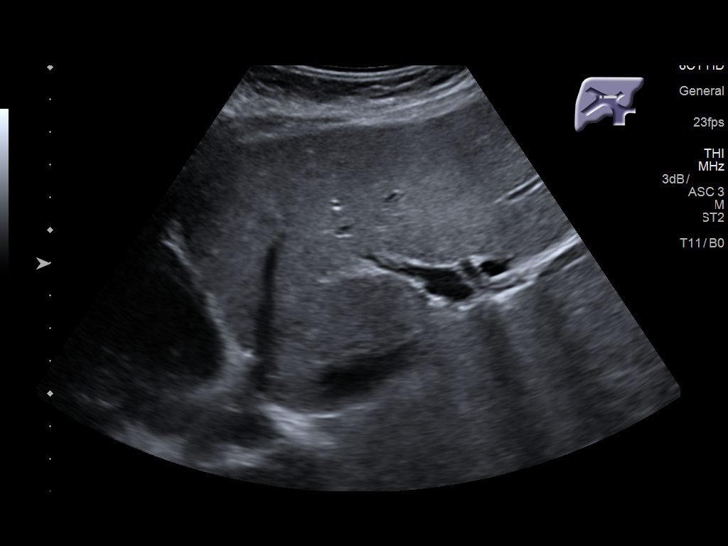
[im 46/91]
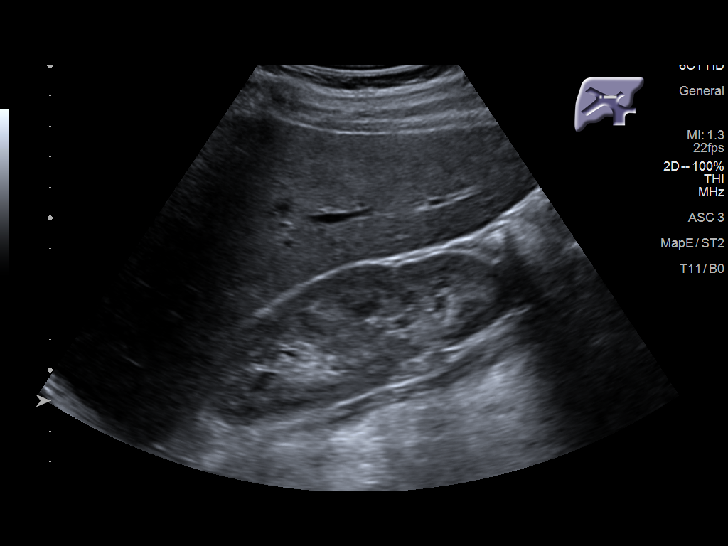
[im 53/91]
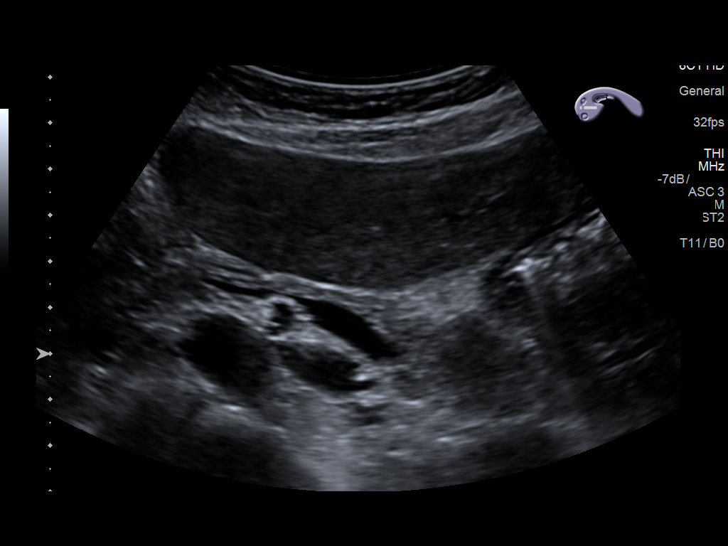
[im 61/91]
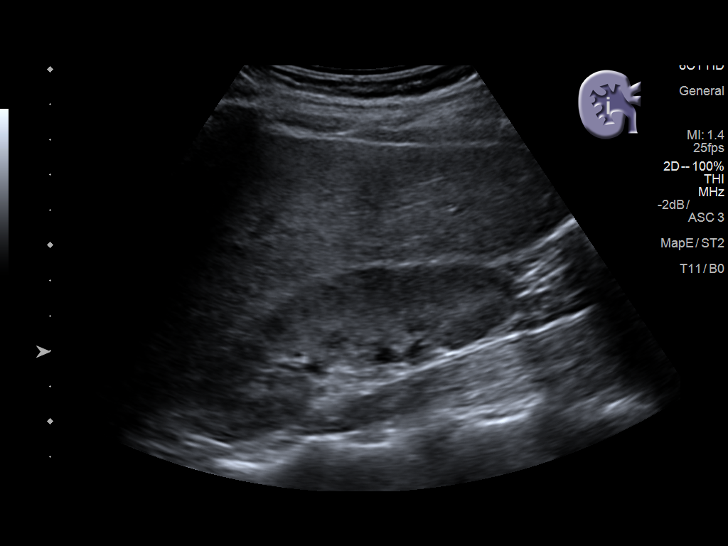
[im 68/91]
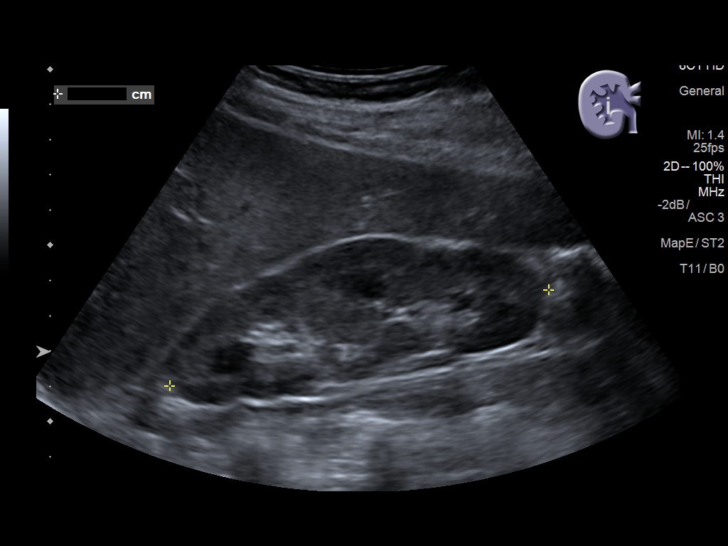
[im 76/91]
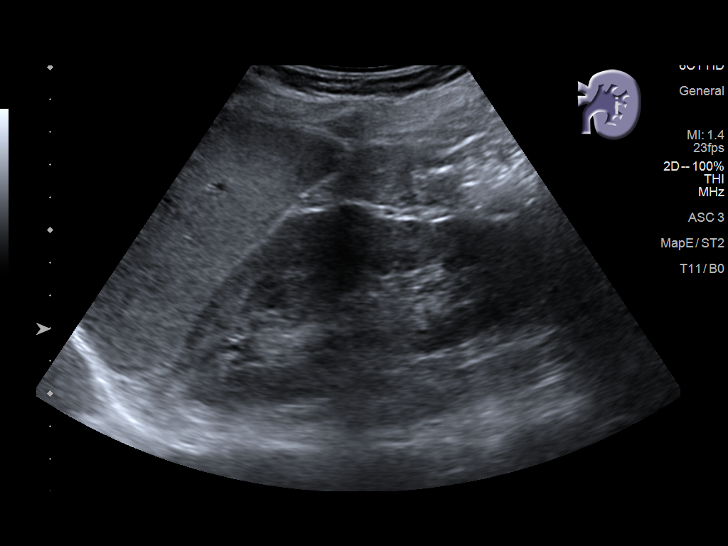
[im 83/91]
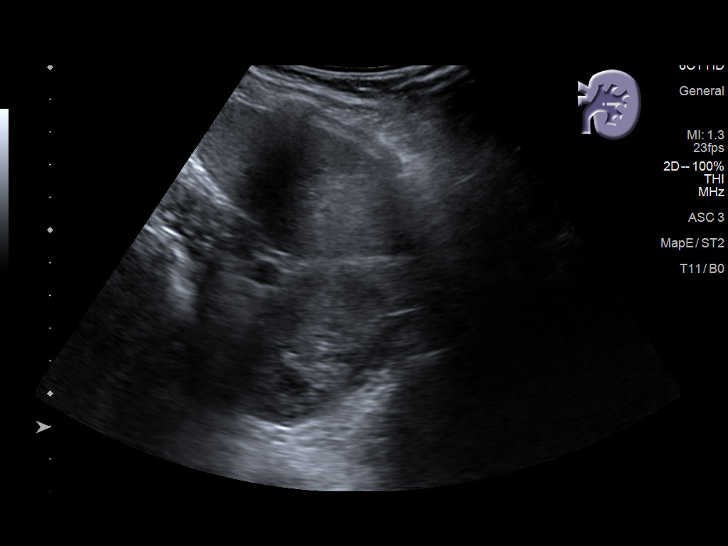
[im 91/91]
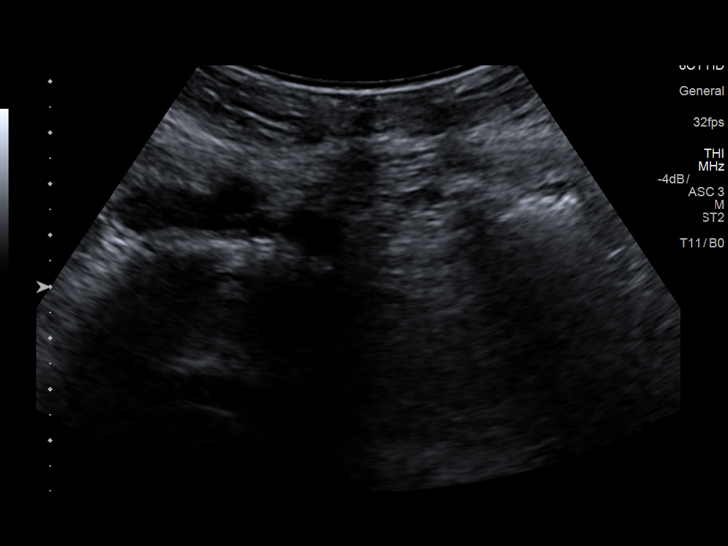

[13 of 25 positions shown; findings below may reference images not displayed]

FINDINGS: ULTRASOUND ABDOMEN

Gallbladder: No gallstones or wall thickening visualized. No
sonographic Murphy sign noted by sonographer.

Common bile duct: Diameter: Dilated, 9 mm. No visible ductal stones.
The distal duct is obscured by bowel gas.

Liver: No focal lesion identified. Within normal limits in
parenchymal echogenicity. Portal vein is patent on color Doppler
imaging with normal direction of blood flow towards the liver.

IVC: No abnormality visualized.

Pancreas: Visualized portion unremarkable.

Spleen: No focal abnormality.  Normal size.

Right Kidney: Length: 11.1 cm. Normal size and echotexture. No focal
abnormality. No hydronephrosis.

Left Kidney: Length: 12.2 cm. Echogenicity within normal limits. No
mass or hydronephrosis visualized.

Abdominal aorta: No aneurysm visualized.

Other findings: None.

ULTRASOUND HEPATIC ELASTOGRAPHY

Device: Siemens Helix VTQ

Patient position: Left lateral decubitus

Transducer 6C1

Number of measurements: 10

Hepatic segment:  8

Median velocity:   1.68 m/sec

IQR:

IQR/Median velocity ratio:

Corresponding Metavir fibrosis score:  F2 + some F3

Risk of fibrosis: Moderate

Limitations of exam: None

Pertinent findings noted on other imaging exams:  None

Please note that abnormal shear wave velocities may also be
identified in clinical settings other than with hepatic fibrosis,
such as: acute hepatitis, elevated right heart and central venous
pressures including use of beta blockers, Tomacsek disease
([place]), infiltrative processes such as
mastocytosis/amyloidosis/infiltrative tumor, extrahepatic
cholestasis, in the post-prandial state, and liver transplantation.
Correlation with patient history, laboratory data, and clinical
condition recommended.
IMPRESSION: ULTRASOUND ABDOMEN:
Dilated common bile duct measuring up to 9 mm. No visible ductal
stones although the distal duct is obscured by bowel gas. Recommend
correlation with LFTs. This could be further evaluated with MRCP.

ULTRASOUND HEPATIC ELASTOGRAPHY:

Median hepatic shear wave velocity is calculated at 1.68 m/sec.

Corresponding Metavir fibrosis score is  F2 + some F3.

Risk of fibrosis is Moderate.

Follow-up: Additional testing appropriate

## 2019-08-08 ENCOUNTER — Other Ambulatory Visit: Payer: Medicaid Other

## 2019-08-18 DIAGNOSIS — R200 Anesthesia of skin: Secondary | ICD-10-CM | POA: Diagnosis not present

## 2019-08-18 DIAGNOSIS — M65341 Trigger finger, right ring finger: Secondary | ICD-10-CM | POA: Diagnosis not present

## 2019-08-18 DIAGNOSIS — M65331 Trigger finger, right middle finger: Secondary | ICD-10-CM | POA: Diagnosis not present

## 2019-08-23 ENCOUNTER — Other Ambulatory Visit: Payer: Self-pay

## 2019-08-23 ENCOUNTER — Other Ambulatory Visit: Payer: Medicaid Other

## 2019-08-23 DIAGNOSIS — B182 Chronic viral hepatitis C: Secondary | ICD-10-CM | POA: Diagnosis not present

## 2019-08-23 DIAGNOSIS — B20 Human immunodeficiency virus [HIV] disease: Secondary | ICD-10-CM

## 2019-08-23 DIAGNOSIS — Z79899 Other long term (current) drug therapy: Secondary | ICD-10-CM

## 2019-08-23 DIAGNOSIS — Z113 Encounter for screening for infections with a predominantly sexual mode of transmission: Secondary | ICD-10-CM

## 2019-08-24 ENCOUNTER — Encounter: Payer: Medicaid Other | Admitting: Internal Medicine

## 2019-08-24 LAB — T-HELPER CELL (CD4) - (RCID CLINIC ONLY)
CD4 % Helper T Cell: 14 % — ABNORMAL LOW (ref 33–65)
CD4 T Cell Abs: 356 /uL — ABNORMAL LOW (ref 400–1790)

## 2019-08-26 LAB — COMPLETE METABOLIC PANEL WITH GFR
AG Ratio: 1.2 (calc) (ref 1.0–2.5)
ALT: 11 U/L (ref 6–29)
AST: 16 U/L (ref 10–35)
Albumin: 3.9 g/dL (ref 3.6–5.1)
Alkaline phosphatase (APISO): 54 U/L (ref 37–153)
BUN: 14 mg/dL (ref 7–25)
CO2: 28 mmol/L (ref 20–32)
Calcium: 9.7 mg/dL (ref 8.6–10.4)
Chloride: 102 mmol/L (ref 98–110)
Creat: 0.72 mg/dL (ref 0.50–1.05)
GFR, Est African American: 108 mL/min/{1.73_m2} (ref >=60)
GFR, Est Non African American: 93 mL/min/{1.73_m2} (ref >=60)
Globulin: 3.2 g/dL (calc) (ref 1.9–3.7)
Glucose, Bld: 89 mg/dL (ref 65–99)
Potassium: 4.2 mmol/L (ref 3.5–5.3)
Sodium: 136 mmol/L (ref 135–146)
Total Bilirubin: 0.3 mg/dL (ref 0.2–1.2)
Total Protein: 7.1 g/dL (ref 6.1–8.1)

## 2019-08-26 LAB — CBC WITH DIFFERENTIAL/PLATELET
Absolute Monocytes: 600 cells/uL (ref 200–950)
Basophils Absolute: 40 cells/uL (ref 0–200)
Basophils Relative: 0.5 %
Eosinophils Absolute: 120 cells/uL (ref 15–500)
Eosinophils Relative: 1.5 %
HCT: 36.6 % (ref 35.0–45.0)
Hemoglobin: 12.4 g/dL (ref 11.7–15.5)
Lymphs Abs: 2800 cells/uL (ref 850–3900)
MCH: 30.7 pg (ref 27.0–33.0)
MCHC: 33.9 g/dL (ref 32.0–36.0)
MCV: 90.6 fL (ref 80.0–100.0)
MPV: 10.4 fL (ref 7.5–12.5)
Monocytes Relative: 7.5 %
Neutro Abs: 4440 cells/uL (ref 1500–7800)
Neutrophils Relative %: 55.5 %
Platelets: 230 10*3/uL (ref 140–400)
RBC: 4.04 10*6/uL (ref 3.80–5.10)
RDW: 12 % (ref 11.0–15.0)
Total Lymphocyte: 35 %
WBC: 8 10*3/uL (ref 3.8–10.8)

## 2019-08-26 LAB — HEPATITIS C RNA QUANTITATIVE
HCV Quantitative Log: <1.18 Log IU/mL
HCV RNA, PCR, QN: <15 IU/mL

## 2019-08-26 LAB — LIPID PANEL
Cholesterol: 190 mg/dL (ref <200)
HDL: 55 mg/dL (ref >=50)
LDL Cholesterol (Calc): 105 mg/dL (calc) — ABNORMAL HIGH
Non-HDL Cholesterol (Calc): 135 mg/dL (calc) — ABNORMAL HIGH (ref <130)
Total CHOL/HDL Ratio: 3.5 (calc) (ref <5.0)
Triglycerides: 188 mg/dL — ABNORMAL HIGH (ref <150)

## 2019-08-26 LAB — RPR: RPR Ser Ql: NONREACTIVE

## 2019-08-26 LAB — HIV-1 RNA QUANT-NO REFLEX-BLD
HIV 1 RNA Quant: 135 copies/mL — ABNORMAL HIGH
HIV-1 RNA Quant, Log: 2.13 Log copies/mL — ABNORMAL HIGH

## 2019-08-31 DIAGNOSIS — G5622 Lesion of ulnar nerve, left upper limb: Secondary | ICD-10-CM | POA: Diagnosis not present

## 2019-08-31 DIAGNOSIS — G5601 Carpal tunnel syndrome, right upper limb: Secondary | ICD-10-CM | POA: Diagnosis not present

## 2019-09-05 ENCOUNTER — Encounter: Payer: Self-pay | Admitting: Internal Medicine

## 2019-09-05 ENCOUNTER — Ambulatory Visit (INDEPENDENT_AMBULATORY_CARE_PROVIDER_SITE_OTHER): Payer: Medicaid Other | Admitting: Internal Medicine

## 2019-09-05 ENCOUNTER — Other Ambulatory Visit: Payer: Self-pay

## 2019-09-05 VITALS — BP 130/82 | HR 69 | Temp 97.9°F | Wt 123.0 lb

## 2019-09-05 DIAGNOSIS — B20 Human immunodeficiency virus [HIV] disease: Secondary | ICD-10-CM | POA: Diagnosis not present

## 2019-09-05 DIAGNOSIS — B182 Chronic viral hepatitis C: Secondary | ICD-10-CM

## 2019-09-05 DIAGNOSIS — Z79899 Other long term (current) drug therapy: Secondary | ICD-10-CM

## 2019-09-05 DIAGNOSIS — Z113 Encounter for screening for infections with a predominantly sexual mode of transmission: Secondary | ICD-10-CM

## 2019-09-05 NOTE — Assessment & Plan Note (Signed)
Repeat RNA negative confirming cure.

## 2019-09-05 NOTE — Assessment & Plan Note (Signed)
Lipid panel noted 

## 2019-09-05 NOTE — Assessment & Plan Note (Addendum)
Doing well, suppressed virus, though some detection. Will continue to monitor.  Given info on getting the COVID-19 vaccine.

## 2019-09-05 NOTE — Assessment & Plan Note (Signed)
Screened negative 

## 2019-09-05 NOTE — Progress Notes (Signed)
   Subjective:    Patient ID: Courtney Ellis, female    DOB: [birth year], 59 y.o.   MRN: [id number]  HPI Here for follow up of HIV and chronic hepatitis C She had been off of Genvoya with a relapse with IV heroin use but remains off drugs now.  Working 6 days a week.  Some recent weight loss and does not eat during her 10 hour work shift.  No new issues otherwise.    Review of Systems  Constitutional: Negative for activity change.  Respiratory: Negative for cough.   Gastrointestinal: Negative for diarrhea.  Skin: Negative for rash.       Objective:   Physical Exam Constitutional:      Appearance: Normal appearance.  Eyes:     General: No scleral icterus. Cardiovascular:     Rate and Rhythm: Normal rate and regular rhythm.     Heart sounds: No murmur.  Pulmonary:     Effort: Pulmonary effort is normal. No respiratory distress.     Breath sounds: Normal breath sounds. No wheezing or rales.  Skin:    Findings: No rash.  Neurological:     Mental Status: She is alert.  Psychiatric:        Mood and Affect: Mood normal.   SH: + tobacco        Assessment & Plan:

## 2019-09-08 DIAGNOSIS — M65341 Trigger finger, right ring finger: Secondary | ICD-10-CM | POA: Diagnosis not present

## 2019-09-13 DIAGNOSIS — G5623 Lesion of ulnar nerve, bilateral upper limbs: Secondary | ICD-10-CM | POA: Diagnosis not present

## 2019-10-13 DIAGNOSIS — G5622 Lesion of ulnar nerve, left upper limb: Secondary | ICD-10-CM | POA: Diagnosis not present

## 2019-10-13 DIAGNOSIS — G5603 Carpal tunnel syndrome, bilateral upper limbs: Secondary | ICD-10-CM | POA: Diagnosis not present

## 2019-10-13 DIAGNOSIS — M65341 Trigger finger, right ring finger: Secondary | ICD-10-CM | POA: Diagnosis not present

## 2019-10-13 DIAGNOSIS — M65331 Trigger finger, right middle finger: Secondary | ICD-10-CM | POA: Diagnosis not present

## 2019-11-14 ENCOUNTER — Other Ambulatory Visit: Payer: Self-pay | Admitting: Internal Medicine

## 2019-11-14 DIAGNOSIS — B20 Human immunodeficiency virus [HIV] disease: Secondary | ICD-10-CM

## 2019-11-14 DIAGNOSIS — I10 Essential (primary) hypertension: Secondary | ICD-10-CM

## 2019-11-23 IMAGING — CR DG HIP (WITH OR WITHOUT PELVIS) 2-3V*L*
2 series · 2 of 2 positions shown · non-contrast
Comparison: None.

CLINICAL DATA: Left hip pain on off for months

EXAM:
DG HIP (WITH OR WITHOUT PELVIS) 2-3V LEFT

[t hip ap left]
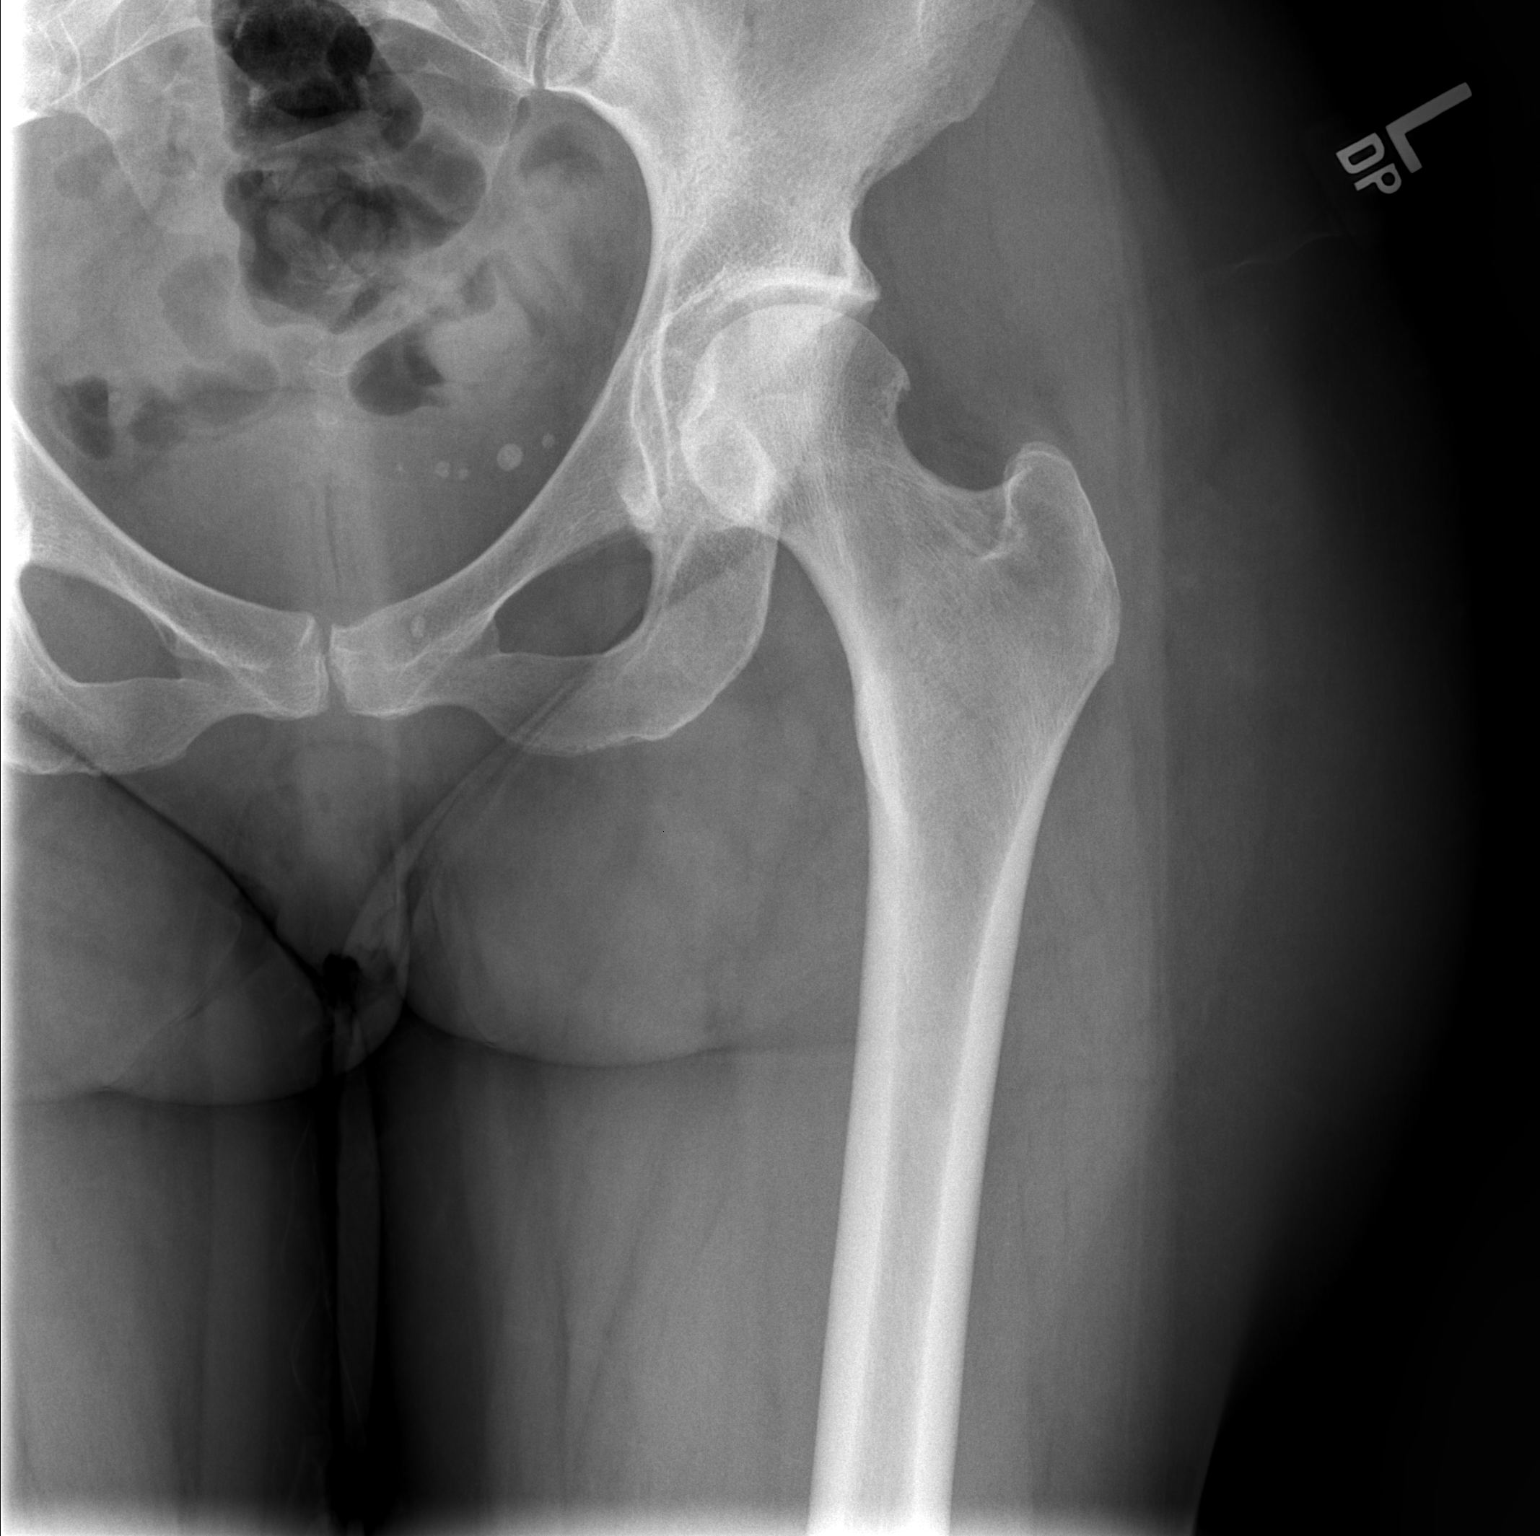

[t hip frog leg left]
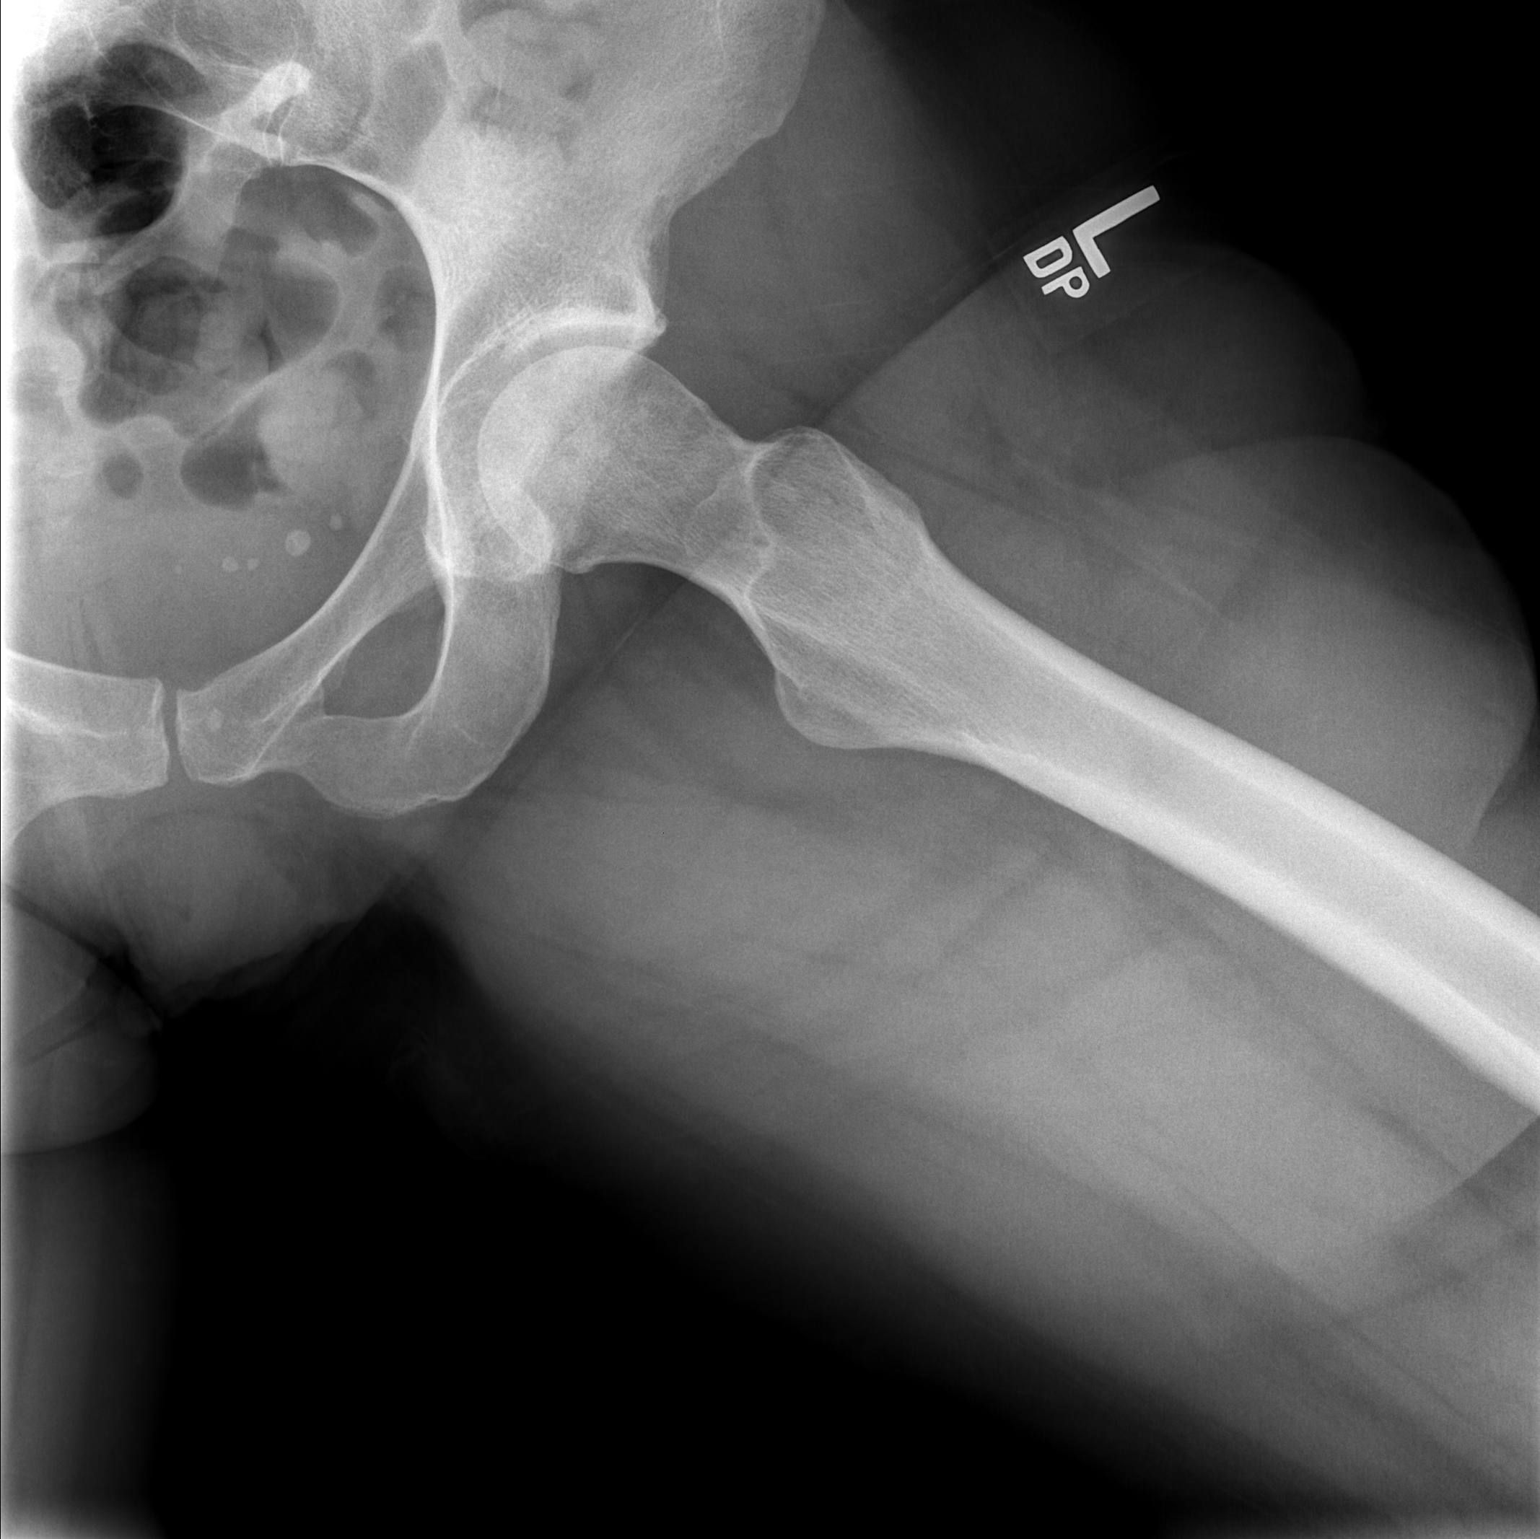

[2 of 2 positions shown; findings below may reference images not displayed]

FINDINGS: No acute fracture or dislocation. No aggressive osseous lesion. Left
hip joint space is relatively well maintained with small marginal
osteophytes.
IMPRESSION: 1.  No acute osseous injury of the left hip.
2. Left hip joint space is relatively well maintained with small
marginal osteophytes likely reflecting mild early osteoarthritis.

## 2019-12-17 ENCOUNTER — Ambulatory Visit (HOSPITAL_COMMUNITY)
Admission: EM | Admit: 2019-12-17 | Discharge: 2019-12-17 | Disposition: A | Discharge status: Home / Self Care | Payer: Medicaid Other

## 2019-12-17 ENCOUNTER — Encounter (HOSPITAL_COMMUNITY): Payer: Self-pay

## 2019-12-17 DIAGNOSIS — M79641 Pain in right hand: Secondary | ICD-10-CM | POA: Diagnosis not present

## 2019-12-17 DIAGNOSIS — M79642 Pain in left hand: Secondary | ICD-10-CM

## 2019-12-17 NOTE — ED Provider Notes (Signed)
[facility]    CSN: [id number] Arrival date & time: 12/17/19  1320      History   Chief Complaint Chief Complaint  Patient presents with  . Hand Pain  . Wrist Pain    HPI Courtney Ellis is a 59 y.o. female.   Patient presents with pain and swelling of both hands and wrist x several months.  She states she has been diagnosed with carpal tunnel and trigger fingers.  She states she is unable to work when this happens and needs a note to be out.  She is waiting for her PCP to give her a referral to see an orthopedist.  She denies rash, lesions, fever, chills, chest pain, shortness of breath, abdominal pain, or other symptoms.  The history is provided by the patient.    Past Medical History:  Diagnosis Date  . Anxiety   . Depression   . Hepatitis    C  . Heroin abuse (HCC)   . HIV infection (HCC)   . Hypertension     Patient Active Problem List   Diagnosis Date Noted  . Routine screening for STI (sexually transmitted infection) 02/07/2019  . Encounter for long-term (current) use of high-risk medication 02/07/2019  . Tobacco abuse 02/07/2019  . Screening for cervical cancer 02/02/2019  . Left hip pain 02/02/2019  . Insomnia 01/12/2018  . Panic anxiety syndrome 01/12/2018  . Liver fibrosis 10/06/2017  . Encounter for screening mammogram for malignant neoplasm of breast 04/27/2017  . Congestion of both ears 03/26/2017  . Heroin abuse (HCC) 10/04/2016  . Essential hypertension 08/03/2007  . Chronic hepatitis C without hepatic coma (HCC) 11/24/2006  . Major depressive disorder, recurrent episode (HCC) 07/28/2006  . Human immunodeficiency virus (HIV) disease (HCC) 05/15/2006    Past Surgical History:  Procedure Laterality Date  . CESAREAN SECTION    . DILATION AND CURETTAGE OF UTERUS N/A 06/27/2016   Procedure: Removal Erasmo Leventhal Body Vagina;  Surgeon: Myna Hidalgo, DO;  Location: [facility];  Service: Gynecology;  Laterality: N/A;  . I & D EXTREMITY Left 09/30/2016     Procedure: IRRIGATION AND DEBRIDEMENT LEFT WRIST AND HAND;  Surgeon: Dominica Severin, MD;  Location: [facility];  Service: Orthopedics;  Laterality: Left;    OB History    Gravida  3   Para  3   Term  3   Preterm      AB      Living  3     SAB      TAB      Ectopic      Multiple      Live Births               Home Medications    Prior to Admission medications   Medication Sig Start Date End Date Taking? Authorizing Provider  albuterol (VENTOLIN HFA) 108 (90 Base) MCG/ACT inhaler INHALE TWO PUFFS BY MOUTH INTO THE LUNGS EVERY 6 HOURS AS NEEDED FOR WHEEZING OR SHORTNESS OF BREATH 05/11/19   Comer, Belia Heman, MD  amLODipine (NORVASC) 5 MG tablet TAKE ONE TABLET BY MOUTH DAILY 11/14/19   Comer, Belia Heman, MD  PREZCOBIX 800-150 MG tablet TAKE ONE TABLET BY MOUTH DAILY . SWALLOW WHOLE. DO NOT CRUSH, BREAK, OR CHEW TABLETS. TAKE WITH FOOD. 11/14/19   Comer, Belia Heman, MD  SUBOXONE 8-2 MG FILM DISSOLVE ONE FILM UNDER THE TONGUE EVERY 8 HOURS 02/02/19   [provider]  TRIUMEQ 600-50-300 MG tablet TAKE ONE TABLET  BY MOUTH DAILY 11/14/19   Comer, Belia Heman, MD    Family History Family History  Problem Relation Age of Onset  . Hypertension Mother   . Alcohol abuse Mother   . Depression Mother   . Diabetes Mother   . Drug abuse Mother   . Alcohol abuse Father   . Cancer Father   . Depression Father   . Drug abuse Father   . Depression Sister   . Diabetes Sister   . Drug abuse Sister   . Mental illness Sister   . Depression Brother   . Drug abuse Brother   . Mental illness Brother   . Depression Maternal Aunt   . Drug abuse Maternal Aunt   . Depression Maternal Uncle   . Drug abuse Maternal Uncle   . Depression Paternal Aunt   . Diabetes Paternal Aunt   . Drug abuse Paternal Aunt   . Stroke Paternal Aunt   . Alcohol abuse Paternal Uncle   . Depression Paternal Uncle   . Diabetes Paternal Uncle   . Drug abuse Paternal Uncle   . Kidney disease Paternal  Uncle     Social History Social History   Tobacco Use  . Smoking status: Current Every Day Smoker    Packs/day: 0.20    Types: Cigarettes  . Smokeless tobacco: Never Used  . Tobacco comment: 3 cigarettes a day  Substance Use Topics  . Alcohol use: No    Alcohol/week: 0.0 standard drinks  . Drug use: Not Currently    Types: IV, Heroin    Comment: reports 10 days clean;      Allergies   Patient has no known allergies.   Review of Systems Review of Systems  Constitutional: Negative for chills and fever.  HENT: Negative for ear pain and sore throat.   Eyes: Negative for pain and visual disturbance.  Respiratory: Negative for cough and shortness of breath.   Cardiovascular: Negative for chest pain and palpitations.  Gastrointestinal: Negative for abdominal pain and vomiting.  Genitourinary: Negative for dysuria and hematuria.  Musculoskeletal: Positive for arthralgias. Negative for back pain.  Skin: Negative for color change and rash.  Neurological: Negative for seizures and syncope.  All other systems reviewed and are negative.    Physical Exam Triage Vital Signs ED Triage Vitals  Enc Vitals Group     BP 12/17/19 1436 127/70     Pulse Rate 12/17/19 1436 69     Resp 12/17/19 1436 17     Temp 12/17/19 1436 99 F (37.2 C)     Temp src --      SpO2 12/17/19 1436 99 %     Weight --      Height --      Head Circumference --      Peak Flow --      Pain Score 12/17/19 1434 10     Pain Loc --      Pain Edu? --      Excl. in GC? --    No data found.  Updated Vital Signs BP 127/70   Pulse 69   Temp 99 F (37.2 C)   Resp 17   LMP 09/08/2011   SpO2 99%   Visual Acuity Right Eye Distance:   Left Eye Distance:   Bilateral Distance:    Right Eye Near:   Left Eye Near:    Bilateral Near:     Physical Exam Vitals and nursing note reviewed.  Constitutional:  General: She is not in acute distress.    Appearance: She is well-developed.  HENT:      Head: Normocephalic and atraumatic.  Eyes:     Conjunctiva/sclera: Conjunctivae normal.  Cardiovascular:     Rate and Rhythm: Normal rate and regular rhythm.     Heart sounds: No murmur heard.   Pulmonary:     Effort: Pulmonary effort is normal. No respiratory distress.     Breath sounds: Normal breath sounds.  Abdominal:     Palpations: Abdomen is soft.     Tenderness: There is no abdominal tenderness.  Musculoskeletal:        General: Swelling and tenderness present.     Cervical back: Neck supple.     Comments: Generalized hand swelling, R>L. Limited ROM in right fingers.    Skin:    General: Skin is warm and dry.     Capillary Refill: Capillary refill takes less than 2 seconds.     Findings: No bruising, erythema, lesion or rash.  Neurological:     General: No focal deficit present.     Mental Status: She is alert and oriented to person, place, and time.     Sensory: No sensory deficit.     Motor: No weakness.     Gait: Gait normal.  Psychiatric:        Mood and Affect: Mood normal.        Behavior: Behavior normal.      UC Treatments / Results  Labs (all labs ordered are listed, but only abnormal results are displayed) Labs Reviewed - No data to display  EKG   Radiology No results found.  Procedures Procedures (including critical care time)  Medications Ordered in UC Medications - No data to display  Initial Impression / Assessment and Plan / UC Course  I have reviewed the triage vital signs and the nursing notes.  Pertinent labs & imaging results that were available during my care of the patient were reviewed by me and considered in my medical decision making (see chart for details).   Bilateral hand pain.  Work note provided for patient as requested.  Instructed her to follow-up with her PCP or an orthopedist on Monday.  Instructed her to go to the ED if she has acute worsening symptoms.  Patient agrees to plan of care.   Final Clinical Impressions(s)  / UC Diagnoses   Final diagnoses:  Bilateral hand pain     Discharge Instructions     Follow-up with your primary care provider on Monday.    Go to the ED if you have acute worsening symptoms.    ED Prescriptions    None     PDMP not reviewed this encounter.   Mickie Bail, NP 12/17/19 1454

## 2019-12-17 NOTE — Discharge Instructions (Addendum)
Follow-up with your primary care provider on Monday.    Go to the ED if you have acute worsening symptoms.

## 2019-12-17 NOTE — ED Triage Notes (Signed)
C/o intermittent hand and wrist pain for "a few months". Reports her fingers lock up, hands and wrists swell, and patient is unable to use extremity while swollen.

## 2019-12-21 ENCOUNTER — Ambulatory Visit
Admission: EM | Admit: 2019-12-21 | Discharge: 2019-12-21 | Disposition: A | Discharge status: Home / Self Care | Payer: Medicaid Other | Attending: Emergency Medicine | Admitting: Emergency Medicine

## 2019-12-21 ENCOUNTER — Ambulatory Visit
Admission: EM | Admit: 2019-12-21 | Discharge: 2019-12-21 | Discharge status: Against Medical Advice | Payer: Medicaid Other

## 2019-12-21 DIAGNOSIS — M79641 Pain in right hand: Secondary | ICD-10-CM

## 2019-12-21 DIAGNOSIS — M79642 Pain in left hand: Secondary | ICD-10-CM | POA: Diagnosis not present

## 2019-12-21 MED ORDER — NAPROXEN 500 MG PO TABS: 500.0000 mg | ORAL_TABLET | Freq: Two times a day (BID) | ORAL | 0 refills | Status: DC

## 2019-12-21 NOTE — ED Triage Notes (Signed)
Pt c/o rt hand/fingers locking up x3 days. States its from doing repetitive work on her job.

## 2019-12-21 NOTE — ED Provider Notes (Signed)
[facility]    CSN: [id number] Arrival date & time: 12/21/19  1754      History   Chief Complaint Chief Complaint  Patient presents with  . Hand Pain    HPI Courtney Ellis is a 59 y.o. female presenting for persistent bilateral hand pain and swelling.  Patient seen on Saturday in urgent care for the same: Given supportive care, work note.  Patient requesting extension of work note as she is currently working with orthopedics to coordinate care for further follow-up.  Denies change in status, injury since last evaluation.    Past Medical History:  Diagnosis Date  . Anxiety   . Depression   . Hepatitis    C  . Heroin abuse (HCC)   . HIV infection (HCC)   . Hypertension     Patient Active Problem List   Diagnosis Date Noted  . Routine screening for STI (sexually transmitted infection) 02/07/2019  . Encounter for long-term (current) use of high-risk medication 02/07/2019  . Tobacco abuse 02/07/2019  . Screening for cervical cancer 02/02/2019  . Left hip pain 02/02/2019  . Insomnia 01/12/2018  . Panic anxiety syndrome 01/12/2018  . Liver fibrosis 10/06/2017  . Encounter for screening mammogram for malignant neoplasm of breast 04/27/2017  . Congestion of both ears 03/26/2017  . Heroin abuse (HCC) 10/04/2016  . Essential hypertension 08/03/2007  . Chronic hepatitis C without hepatic coma (HCC) 11/24/2006  . Major depressive disorder, recurrent episode (HCC) 07/28/2006  . Human immunodeficiency virus (HIV) disease (HCC) 05/15/2006    Past Surgical History:  Procedure Laterality Date  . CESAREAN SECTION    . DILATION AND CURETTAGE OF UTERUS N/A 06/27/2016   Procedure: Removal Erasmo Leventhal Body Vagina;  Surgeon: Myna Hidalgo, DO;  Location: [facility];  Service: Gynecology;  Laterality: N/A;  . I & D EXTREMITY Left 09/30/2016   Procedure: IRRIGATION AND DEBRIDEMENT LEFT WRIST AND HAND;  Surgeon: Dominica Severin, MD;  Location: [facility];  Service: Orthopedics;   Laterality: Left;    OB History    Gravida  3   Para  3   Term  3   Preterm      AB      Living  3     SAB      TAB      Ectopic      Multiple      Live Births               Home Medications    Prior to Admission medications   Medication Sig Start Date End Date Taking? Authorizing Provider  albuterol (VENTOLIN HFA) 108 (90 Base) MCG/ACT inhaler INHALE TWO PUFFS BY MOUTH INTO THE LUNGS EVERY 6 HOURS AS NEEDED FOR WHEEZING OR SHORTNESS OF BREATH 05/11/19   Comer, Belia Heman, MD  amLODipine (NORVASC) 5 MG tablet TAKE ONE TABLET BY MOUTH DAILY 11/14/19   Comer, Belia Heman, MD  naproxen (NAPROSYN) 500 MG tablet Take 1 tablet (500 mg total) by mouth 2 (two) times daily. 12/21/19   Hall-Potvin, [place], PA-C  PREZCOBIX 800-150 MG tablet TAKE ONE TABLET BY MOUTH DAILY . SWALLOW WHOLE. DO NOT CRUSH, BREAK, OR CHEW TABLETS. TAKE WITH FOOD. 11/14/19   Comer, Belia Heman, MD  SUBOXONE 8-2 MG FILM DISSOLVE ONE FILM UNDER THE TONGUE EVERY 8 HOURS 02/02/19   [provider]  TRIUMEQ 600-50-300 MG tablet TAKE ONE TABLET BY MOUTH DAILY 11/14/19   Comer, Belia Heman, MD    Family History Family  History  Problem Relation Age of Onset  . Hypertension Mother   . Alcohol abuse Mother   . Depression Mother   . Diabetes Mother   . Drug abuse Mother   . Alcohol abuse Father   . Cancer Father   . Depression Father   . Drug abuse Father   . Depression Sister   . Diabetes Sister   . Drug abuse Sister   . Mental illness Sister   . Depression Brother   . Drug abuse Brother   . Mental illness Brother   . Depression Maternal Aunt   . Drug abuse Maternal Aunt   . Depression Maternal Uncle   . Drug abuse Maternal Uncle   . Depression Paternal Aunt   . Diabetes Paternal Aunt   . Drug abuse Paternal Aunt   . Stroke Paternal Aunt   . Alcohol abuse Paternal Uncle   . Depression Paternal Uncle   . Diabetes Paternal Uncle   . Drug abuse Paternal Uncle   . Kidney disease Paternal Uncle       Social History Social History   Tobacco Use  . Smoking status: Current Every Day Smoker    Packs/day: 0.20    Types: Cigarettes  . Smokeless tobacco: Never Used  . Tobacco comment: 3 cigarettes a day  Substance Use Topics  . Alcohol use: No    Alcohol/week: 0.0 standard drinks  . Drug use: Not Currently    Types: IV, Heroin    Comment: reports 10 days clean;      Allergies   Patient has no known allergies.   Review of Systems As per HPI   Physical Exam Triage Vital Signs ED Triage Vitals  Enc Vitals Group     BP      Pulse      Resp      Temp      Temp src      SpO2      Weight      Height      Head Circumference      Peak Flow      Pain Score      Pain Loc      Pain Edu?      Excl. in GC?    No data found.  Updated Vital Signs BP 130/73 (BP Location: Left Arm)   Pulse 76   Temp 98.6 F (37 C) (Oral)   Resp 16   LMP 09/08/2011   SpO2 98%   Visual Acuity Right Eye Distance:   Left Eye Distance:   Bilateral Distance:    Right Eye Near:   Left Eye Near:    Bilateral Near:     Physical Exam Constitutional:      General: She is not in acute distress. HENT:     Head: Normocephalic and atraumatic.  Eyes:     General: No scleral icterus.    Pupils: Pupils are equal, round, and reactive to light.  Cardiovascular:     Rate and Rhythm: Normal rate.  Pulmonary:     Effort: Pulmonary effort is normal.  Musculoskeletal:        General: Swelling and tenderness present. No deformity. Normal range of motion.     Comments: Mild, diffuse.  No focal bony tenderness, effusion.  Skin:    Capillary Refill: Capillary refill takes less than 2 seconds.     Coloration: Skin is not jaundiced or pale.     Findings: No erythema or rash.  Neurological:  General: No focal deficit present.     Mental Status: She is alert and oriented to person, place, and time.      UC Treatments / Results  Labs (all labs ordered are listed, but only abnormal  results are displayed) Labs Reviewed - No data to display  EKG   Radiology No results found.  Procedures Procedures (including critical care time)  Medications Ordered in UC Medications - No data to display  Initial Impression / Assessment and Plan / UC Course  I have reviewed the triage vital signs and the nursing notes.  Pertinent labs & imaging results that were available during my care of the patient were reviewed by me and considered in my medical decision making (see chart for details).     Patient seen 12/17/2018 in urgent care for bilateral hand pain (R>L): Per chart review patient treated supportively, recommended orthopedic follow-up.  Will provide NSAID, office information for Dr. Amanda Pea, as well as urgent care hours for specialist.  Work note provided.  Return precautions discussed, pt verbalized understanding and is agreeable to plan. Final Clinical Impressions(s) / UC Diagnoses   Final diagnoses:  Bilateral hand pain     Discharge Instructions     RICE: rest, ice, compression, elevation as needed for pain.    Pain medication:  500 mg Naprosyn/Aleve (naproxen) every 12 hours with food:  AVOID other NSAIDs while taking this (may have Tylenol).  Important to follow up with specialist(s) below for further evaluation/management if your symptoms persist or worsen.    ED Prescriptions    Medication Sig Dispense Auth. Provider   naproxen (NAPROSYN) 500 MG tablet Take 1 tablet (500 mg total) by mouth 2 (two) times daily. 30 tablet Hall-Potvin, [place], PA-C     PDMP not reviewed this encounter.   Hall-Potvin, [place], [place] 12/21/19 2026

## 2019-12-21 NOTE — Discharge Instructions (Addendum)
RICE: rest, ice, compression, elevation as needed for pain.    Pain medication:  500 mg Naprosyn/Aleve (naproxen) every 12 hours with food:  AVOID other NSAIDs while taking this (may have Tylenol).  Important to follow up with specialist(s) below for further evaluation/management if your symptoms persist or worsen. 

## 2020-02-28 ENCOUNTER — Other Ambulatory Visit: Payer: Self-pay

## 2020-02-28 ENCOUNTER — Other Ambulatory Visit: Payer: Medicaid Other

## 2020-02-28 DIAGNOSIS — B20 Human immunodeficiency virus [HIV] disease: Secondary | ICD-10-CM

## 2020-02-29 ENCOUNTER — Telehealth: Payer: Self-pay | Admitting: *Deleted

## 2020-02-29 DIAGNOSIS — L659 Nonscarring hair loss, unspecified: Secondary | ICD-10-CM

## 2020-02-29 DIAGNOSIS — B20 Human immunodeficiency virus [HIV] disease: Secondary | ICD-10-CM

## 2020-02-29 LAB — T-HELPER CELL (CD4) - (RCID CLINIC ONLY)
CD4 % Helper T Cell: 15 % — ABNORMAL LOW (ref 33–65)
CD4 T Cell Abs: 428 /uL (ref 400–1790)

## 2020-02-29 NOTE — Telephone Encounter (Signed)
Patient is concerned - she is losing her hair. She has been losing eyebrows and eyelashes for the last 2 years and feels like the hai rloss on her head has really increased over the last 2 months. Still taking suboxone, prezcobix, triumeq, amlodipine.  She is not sexually active, no history of syphilis or positive RPR. She states her sister living with her does have syphilis, asked if she could get it from living with her. 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  She is doing a better job of eating substantive meals and her weight has leveled out at 123-125. Please advise if you'd like any additional labs before her visit 11/22.  Only had viral load and CD4 drawn.  Andree Coss, RN

## 2020-02-29 NOTE — Telephone Encounter (Signed)
TSH She really needs a pcp.  With hairloss like that, a dermatologist could  evaluate.

## 2020-03-01 NOTE — Addendum Note (Signed)
Addended by: Andree Coss on: 03/01/2020 10:32 AM   Modules accepted: Orders

## 2020-03-02 ENCOUNTER — Other Ambulatory Visit: Payer: Self-pay

## 2020-03-02 ENCOUNTER — Other Ambulatory Visit: Payer: Medicaid Other

## 2020-03-02 DIAGNOSIS — B20 Human immunodeficiency virus [HIV] disease: Secondary | ICD-10-CM

## 2020-03-02 DIAGNOSIS — L659 Nonscarring hair loss, unspecified: Secondary | ICD-10-CM

## 2020-03-02 LAB — TSH: TSH: 0.86 mIU/L (ref 0.40–4.50)

## 2020-03-05 LAB — HIV-1 RNA QUANT-NO REFLEX-BLD
HIV 1 RNA Quant: 149 Copies/mL — ABNORMAL HIGH
HIV-1 RNA Quant, Log: 2.17 Log cps/mL — ABNORMAL HIGH

## 2020-03-12 ENCOUNTER — Encounter: Payer: Self-pay | Admitting: Internal Medicine

## 2020-03-12 ENCOUNTER — Ambulatory Visit (INDEPENDENT_AMBULATORY_CARE_PROVIDER_SITE_OTHER): Payer: Medicaid Other | Admitting: Internal Medicine

## 2020-03-12 ENCOUNTER — Other Ambulatory Visit: Payer: Self-pay

## 2020-03-12 VITALS — BP 172/92 | HR 66 | Temp 98.4°F | Wt 125.0 lb

## 2020-03-12 DIAGNOSIS — L658 Other specified nonscarring hair loss: Secondary | ICD-10-CM | POA: Insufficient documentation

## 2020-03-12 DIAGNOSIS — Z23 Encounter for immunization: Secondary | ICD-10-CM | POA: Diagnosis not present

## 2020-03-12 DIAGNOSIS — Z113 Encounter for screening for infections with a predominantly sexual mode of transmission: Secondary | ICD-10-CM

## 2020-03-12 DIAGNOSIS — B20 Human immunodeficiency virus [HIV] disease: Secondary | ICD-10-CM

## 2020-03-12 NOTE — Assessment & Plan Note (Addendum)
New issue.  Unclear etiology and TSH normal.  Has an appt with dermatology.

## 2020-03-12 NOTE — Progress Notes (Signed)
   Subjective:    Patient ID: Courtney Ellis, female    DOB: [birth year], 59 y.o.   MRN: [id number]  HPI Here for follow up of HIV and chronic hepatitis C She continues with Genvoya and has had some missed doses.  CD4 of 428, viral load remains low at 149, but not completely suppressed.  She has concerns recently of loosing hair.  Has an appt with dermatology and also with a new PCP.  She recently bought a house and moved into it.  Happy about that.    Review of Systems  Constitutional: Negative for fatigue.  Gastrointestinal: Negative for diarrhea.  Skin: Negative for rash.       Objective:   Physical Exam Constitutional:      Appearance: Normal appearance.  Eyes:     General: No scleral icterus. Cardiovascular:     Rate and Rhythm: Normal rate and regular rhythm.  Pulmonary:     Effort: Pulmonary effort is normal.     Breath sounds: Normal breath sounds.  Skin:    Findings: No rash.  Neurological:     General: No focal deficit present.     Mental Status: She is alert.  Psychiatric:        Mood and Affect: Mood normal.   SH: + tobacco; remains drug free        Assessment & Plan:

## 2020-03-12 NOTE — Assessment & Plan Note (Signed)
She overall is doing well but did miss some doses and accounts for her low level viremia.  She is on track and now not missing any.  Will recheck in 6 months.

## 2020-05-18 ENCOUNTER — Other Ambulatory Visit: Payer: Self-pay | Admitting: Internal Medicine

## 2020-05-18 DIAGNOSIS — I10 Essential (primary) hypertension: Secondary | ICD-10-CM

## 2020-05-18 DIAGNOSIS — B20 Human immunodeficiency virus [HIV] disease: Secondary | ICD-10-CM

## 2020-06-07 ENCOUNTER — Other Ambulatory Visit: Payer: Self-pay | Admitting: Physician Assistant

## 2020-06-07 DIAGNOSIS — Z1231 Encounter for screening mammogram for malignant neoplasm of breast: Secondary | ICD-10-CM

## 2020-06-07 DIAGNOSIS — R5381 Other malaise: Secondary | ICD-10-CM

## 2020-06-15 ENCOUNTER — Other Ambulatory Visit: Payer: Self-pay | Admitting: Physician Assistant

## 2020-06-28 ENCOUNTER — Other Ambulatory Visit: Payer: Self-pay | Admitting: Physician Assistant

## 2020-06-28 DIAGNOSIS — Z780 Asymptomatic menopausal state: Secondary | ICD-10-CM

## 2020-08-07 ENCOUNTER — Other Ambulatory Visit: Payer: Medicaid Other

## 2020-08-21 ENCOUNTER — Ambulatory Visit (INDEPENDENT_AMBULATORY_CARE_PROVIDER_SITE_OTHER): Payer: Medicaid Other | Admitting: Internal Medicine

## 2020-08-21 ENCOUNTER — Encounter: Payer: Self-pay | Admitting: Internal Medicine

## 2020-08-21 ENCOUNTER — Other Ambulatory Visit (HOSPITAL_COMMUNITY)
Admission: RE | Admit: 2020-08-21 | Discharge: 2020-08-21 | Disposition: A | Discharge status: Home / Self Care | Payer: Medicaid Other | Source: Ambulatory Visit | Attending: Internal Medicine | Admitting: Internal Medicine

## 2020-08-21 ENCOUNTER — Other Ambulatory Visit: Payer: Self-pay

## 2020-08-21 VITALS — BP 123/79 | HR 73 | Temp 98.2°F | Wt 142.0 lb

## 2020-08-21 DIAGNOSIS — B20 Human immunodeficiency virus [HIV] disease: Secondary | ICD-10-CM

## 2020-08-21 DIAGNOSIS — Z79899 Other long term (current) drug therapy: Secondary | ICD-10-CM | POA: Diagnosis not present

## 2020-08-21 DIAGNOSIS — Z113 Encounter for screening for infections with a predominantly sexual mode of transmission: Secondary | ICD-10-CM | POA: Diagnosis present

## 2020-08-21 DIAGNOSIS — L658 Other specified nonscarring hair loss: Secondary | ICD-10-CM

## 2020-08-21 DIAGNOSIS — F1110 Opioid abuse, uncomplicated: Secondary | ICD-10-CM | POA: Diagnosis not present

## 2020-08-21 NOTE — Assessment & Plan Note (Signed)
We discussed continued need to abstain.

## 2020-08-21 NOTE — Progress Notes (Signed)
   Subjective:    Patient ID: Courtney Ellis, female    DOB: 08/14/1960, 60 y.o.   MRN: [id number]  HPI Here for follow up of HIV  she continues on Triumeq and Prezcobix with no missed doses.  She continues with occasional missed doses but has been on it well recently.  Her current partner has been more actively using drugs and she is looking to move out and find her own place.  No labs prior to this visit.  Last HIV RNA was 149 in the setting of occasional missed doses.  Still uses occasional drugs.  Has had recent abdominal bloating and underwent colonoscopy.  Scheduled for an EGD next week.    Review of Systems  Constitutional: Negative for fatigue.  Gastrointestinal: Negative for diarrhea and nausea.  Skin: Negative for rash.       Objective:   Physical Exam Constitutional:      Appearance: Normal appearance.  Eyes:     General: No scleral icterus. Abdominal:     General: There is distension.     Palpations: Abdomen is soft.  Neurological:     General: No focal deficit present.     Mental Status: She is alert.  Psychiatric:        Mood and Affect: Mood normal.   SH: + tobacco        Assessment & Plan:

## 2020-08-21 NOTE — Assessment & Plan Note (Signed)
This is improved with OTC care and no concerns today

## 2020-08-21 NOTE — Assessment & Plan Note (Signed)
Lipid panel today

## 2020-08-21 NOTE — Assessment & Plan Note (Signed)
She has been doing relatively well but I discussed the concerns with some missed doses.   She is going to continue to make more efforts at good compliance and I will check a genotype today to be sure no underlying resistance.  If ok, rtc in 6 months.

## 2020-08-21 NOTE — Assessment & Plan Note (Signed)
Will screen 

## 2020-08-22 LAB — T-HELPER CELL (CD4) - (RCID CLINIC ONLY)
CD4 % Helper T Cell: 14 % — ABNORMAL LOW (ref 33–65)
CD4 T Cell Abs: 341 /uL — ABNORMAL LOW (ref 400–1790)

## 2020-08-23 LAB — URINE CYTOLOGY ANCILLARY ONLY
Chlamydia: NEGATIVE
Comment: NEGATIVE
Comment: NORMAL
Neisseria Gonorrhea: NEGATIVE

## 2020-08-23 LAB — COMPLETE METABOLIC PANEL WITH GFR
AG Ratio: 1 (calc) (ref 1.0–2.5)
ALT: 16 U/L (ref 6–29)
AST: 22 U/L (ref 10–35)
Albumin: 3.8 g/dL (ref 3.6–5.1)
Alkaline phosphatase (APISO): 65 U/L (ref 37–153)
BUN: 18 mg/dL (ref 7–25)
CO2: 27 mmol/L (ref 20–32)
Calcium: 9.7 mg/dL (ref 8.6–10.4)
Chloride: 104 mmol/L (ref 98–110)
Creat: 0.88 mg/dL (ref 0.50–1.05)
GFR, Est African American: 84 mL/min/{1.73_m2} (ref >=60)
GFR, Est Non African American: 72 mL/min/{1.73_m2} (ref >=60)
Globulin: 3.8 g/dL (calc) — ABNORMAL HIGH (ref 1.9–3.7)
Glucose, Bld: 99 mg/dL (ref 65–99)
Potassium: 4 mmol/L (ref 3.5–5.3)
Sodium: 138 mmol/L (ref 135–146)
Total Bilirubin: 0.3 mg/dL (ref 0.2–1.2)
Total Protein: 7.6 g/dL (ref 6.1–8.1)

## 2020-08-23 LAB — CBC WITH DIFFERENTIAL/PLATELET
Absolute Monocytes: 561 cells/uL (ref 200–950)
Basophils Absolute: 40 cells/uL (ref 0–200)
Basophils Relative: 0.6 %
Eosinophils Absolute: 158 cells/uL (ref 15–500)
Eosinophils Relative: 2.4 %
HCT: 38.3 % (ref 35.0–45.0)
Hemoglobin: 12.8 g/dL (ref 11.7–15.5)
Lymphs Abs: 2818 cells/uL (ref 850–3900)
MCH: 29.4 pg (ref 27.0–33.0)
MCHC: 33.4 g/dL (ref 32.0–36.0)
MCV: 87.8 fL (ref 80.0–100.0)
MPV: 10.4 fL (ref 7.5–12.5)
Monocytes Relative: 8.5 %
Neutro Abs: 3023 cells/uL (ref 1500–7800)
Neutrophils Relative %: 45.8 %
Platelets: 225 10*3/uL (ref 140–400)
RBC: 4.36 10*6/uL (ref 3.80–5.10)
RDW: 11.8 % (ref 11.0–15.0)
Total Lymphocyte: 42.7 %
WBC: 6.6 10*3/uL (ref 3.8–10.8)

## 2020-08-23 LAB — LIPID PANEL
Cholesterol: 210 mg/dL — ABNORMAL HIGH (ref <200)
HDL: 51 mg/dL (ref >=50)
LDL Cholesterol (Calc): 129 mg/dL (calc) — ABNORMAL HIGH
Non-HDL Cholesterol (Calc): 159 mg/dL (calc) — ABNORMAL HIGH (ref <130)
Total CHOL/HDL Ratio: 4.1 (calc) (ref <5.0)
Triglycerides: 188 mg/dL — ABNORMAL HIGH (ref <150)

## 2020-08-23 LAB — HIV-1 RNA QUANT-NO REFLEX-BLD
HIV 1 RNA Quant: 165 Copies/mL — ABNORMAL HIGH
HIV-1 RNA Quant, Log: 2.22 Log cps/mL — ABNORMAL HIGH

## 2020-08-23 LAB — RPR: RPR Ser Ql: NONREACTIVE

## 2020-08-23 LAB — HEPATITIS C RNA QUANTITATIVE
HCV Quantitative Log: <1.18 log IU/mL
HCV RNA, PCR, QN: <15 IU/mL

## 2020-08-29 LAB — HIV-1 GENOTYPING (RTI,PI,IN INHBTR)
Date Viral Load Collected: 5032022
HIV-1 Genotype: DETECTED — AB

## 2020-09-11 ENCOUNTER — Telehealth: Payer: Self-pay

## 2020-09-11 NOTE — Telephone Encounter (Signed)
Patient called asking if she can see PCP for cough and a CXR given smoking Hx. Denies fever or chills or any oter symptoms. Cough is non productive. She will go to [facility] this morning. Also reports covid test was negative.

## 2020-09-12 ENCOUNTER — Encounter (HOSPITAL_COMMUNITY): Payer: Self-pay | Admitting: *Deleted

## 2020-09-12 ENCOUNTER — Other Ambulatory Visit: Payer: Self-pay

## 2020-09-12 ENCOUNTER — Ambulatory Visit (HOSPITAL_COMMUNITY)
Admission: EM | Admit: 2020-09-12 | Discharge: 2020-09-12 | Disposition: A | Discharge status: Home / Self Care | Payer: Medicaid Other | Attending: Family Medicine | Admitting: Family Medicine

## 2020-09-12 DIAGNOSIS — F17210 Nicotine dependence, cigarettes, uncomplicated: Secondary | ICD-10-CM | POA: Diagnosis not present

## 2020-09-12 DIAGNOSIS — J40 Bronchitis, not specified as acute or chronic: Secondary | ICD-10-CM | POA: Diagnosis not present

## 2020-09-12 DIAGNOSIS — R0602 Shortness of breath: Secondary | ICD-10-CM

## 2020-09-12 MED ORDER — PREDNISONE 20 MG PO TABS: 40.0000 mg | ORAL_TABLET | Freq: Every day | ORAL | 0 refills | Status: DC

## 2020-09-12 MED ORDER — ALBUTEROL SULFATE HFA 108 (90 BASE) MCG/ACT IN AERS: 1.0000 | INHALATION_SPRAY | Freq: Four times a day (QID) | RESPIRATORY_TRACT | 1 refills | Status: DC | PRN

## 2020-09-12 NOTE — ED Triage Notes (Signed)
Pt reports cough for 5 days.

## 2020-09-12 NOTE — ED Provider Notes (Signed)
[facility]    CSN: [id number] Arrival date & time: 09/12/20  6387      History   Chief Complaint Chief Complaint  Patient presents with  . Cough    HPI Courtney Ellis is a 60 y.o. female.   Patient presenting today with 5-day history of progressively worsening hacking cough.  States the cough has been dry at this point.  She denies congestion, sore throat, fever, chills, body aches, chest pain, shortness of breath, wheezing.  She states she is prone to getting bronchitis and has been a longtime smoker.  Used to have an albuterol inhaler around but has run out.  States someone that she lives with has been sick recently but unsure with what.     Past Medical History:  Diagnosis Date  . Anxiety   . Depression   . Hepatitis    C  . Heroin abuse (HCC)   . HIV infection (HCC)   . Hypertension     Patient Active Problem List   Diagnosis Date Noted  . 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 Routine screening for STI (sexually transmitted infection) 02/07/2019  . Encounter for long-term (current) use of high-risk medication 02/07/2019  . Tobacco abuse 02/07/2019  . Screening for cervical cancer 02/02/2019  . Left hip pain 02/02/2019  . Insomnia 01/12/2018  . Panic anxiety syndrome 01/12/2018  . Liver fibrosis 10/06/2017  . Encounter for screening mammogram for malignant neoplasm of breast 04/27/2017  . Congestion of both ears 03/26/2017  . Heroin abuse (HCC) 10/04/2016  . Essential hypertension 08/03/2007  . Chronic hepatitis C without hepatic coma (HCC) 11/24/2006  . Major depressive disorder, recurrent episode (HCC) 07/28/2006  . Human immunodeficiency virus (HIV) disease (HCC) 05/15/2006    Past Surgical History:  Procedure Laterality Date  . CESAREAN SECTION    . DILATION AND CURETTAGE OF UTERUS N/A 06/27/2016   Procedure: Removal Erasmo Leventhal Body Vagina;  Surgeon: Myna Hidalgo, DO;  Location: [facility];  Service: Gynecology;  Laterality: N/A;  . I & D  EXTREMITY Left 09/30/2016   Procedure: IRRIGATION AND DEBRIDEMENT LEFT WRIST AND HAND;  Surgeon: Dominica Severin, MD;  Location: [facility];  Service: Orthopedics;  Laterality: Left;    OB History    Gravida  3   Para  3   Term  3   Preterm      AB      Living  3     SAB      IAB      Ectopic      Multiple      Live Births               Home Medications    Prior to Admission medications   Medication Sig Start Date End Date Taking? Authorizing Provider  predniSONE (DELTASONE) 20 MG tablet Take 2 tablets (40 mg total) by mouth daily with breakfast. 09/12/20  Yes Particia Nearing, PA-C  albuterol (VENTOLIN HFA) 108 (90 Base) MCG/ACT inhaler Inhale 1-2 puffs into the lungs every 6 (six) hours as needed for wheezing or shortness of breath. 09/12/20   Particia Nearing, PA-C  amLODipine (NORVASC) 5 MG tablet TAKE ONE TABLET BY MOUTH DAILY 05/18/20   Comer, Belia Heman, MD  gabapentin (NEURONTIN) 300 MG capsule Take 300 mg by mouth daily. 02/02/20   [provider]  naproxen (NAPROSYN) 500 MG tablet Take 1 tablet (500 mg total) by mouth 2 (two) times daily. 12/21/19   Hall-Potvin, [place],  PA-C  PREZCOBIX 800-150 MG tablet TAKE ONE TABLET BY MOUTH DAILY . SWALLOW WHOLE. DO NOT CRUSH, BREAK, OR CHEW TABLETS. TAKE WITH FOOD. 05/18/20   Comer, Belia Heman, MD  SUBOXONE 8-2 MG FILM DISSOLVE ONE FILM UNDER THE TONGUE EVERY 8 HOURS 02/02/19   [provider]  TRIUMEQ 600-50-300 MG tablet TAKE ONE TABLET BY MOUTH DAILY 05/18/20   Comer, Belia Heman, MD    Family History Family History  Problem Relation Age of Onset  . Hypertension Mother   . Alcohol abuse Mother   . Depression Mother   . Diabetes Mother   . Drug abuse Mother   . Alcohol abuse Father   . Cancer Father   . Depression Father   . Drug abuse Father   . Depression Sister   . Diabetes Sister   . Drug abuse Sister   . Mental illness Sister   . Depression Brother   . Drug abuse Brother   . Mental  illness Brother   . Depression Maternal Aunt   . Drug abuse Maternal Aunt   . Depression Maternal Uncle   . Drug abuse Maternal Uncle   . Depression Paternal Aunt   . Diabetes Paternal Aunt   . Drug abuse Paternal Aunt   . Stroke Paternal Aunt   . Alcohol abuse Paternal Uncle   . Depression Paternal Uncle   . Diabetes Paternal Uncle   . Drug abuse Paternal Uncle   . Kidney disease Paternal Uncle     Social History Social History   Tobacco Use  . Smoking status: Current Every Day Smoker    Packs/day: 0.20    Types: Cigarettes  . Smokeless tobacco: Never Used  . Tobacco comment: 3 cigarettes a day  Substance Use Topics  . Alcohol use: No    Alcohol/week: 0.0 standard drinks  . Drug use: Not Currently    Types: IV, Heroin    Comment: reports 10 days clean;      Allergies   Patient has no known allergies.   Review of Systems Review of Systems Per HPI  Physical Exam Triage Vital Signs ED Triage Vitals  Enc Vitals Group     BP 09/12/20 0858 125/75     Pulse Rate 09/12/20 0858 73     Resp 09/12/20 0858 18     Temp 09/12/20 0858 98.2 F (36.8 C)     Temp src --      SpO2 09/12/20 0858 100 %     Weight --      Height --      Head Circumference --      Peak Flow --      Pain Score 09/12/20 0855 0     Pain Loc --      Pain Edu? --      Excl. in GC? --    No data found.  Updated Vital Signs BP 125/75   Pulse 73   Temp 98.2 F (36.8 C)   Resp 18   LMP 09/08/2011   SpO2 100%   Visual Acuity Right Eye Distance:   Left Eye Distance:   Bilateral Distance:    Right Eye Near:   Left Eye Near:    Bilateral Near:     Physical Exam Vitals and nursing note reviewed.  Constitutional:      Appearance: Normal appearance. She is not ill-appearing.  HENT:     Head: Atraumatic.     Right Ear: Tympanic membrane normal.  Left Ear: Tympanic membrane normal.     Nose: Nose normal.     Mouth/Throat:     Mouth: Mucous membranes are moist.     Pharynx:  Oropharynx is clear.  Eyes:     Extraocular Movements: Extraocular movements intact.     Conjunctiva/sclera: Conjunctivae normal.  Cardiovascular:     Rate and Rhythm: Normal rate and regular rhythm.     Heart sounds: Normal heart sounds.  Pulmonary:     Effort: Pulmonary effort is normal. No respiratory distress.     Breath sounds: Normal breath sounds. No wheezing or rales.  Abdominal:     General: Bowel sounds are normal. There is no distension.     Palpations: Abdomen is soft.     Tenderness: There is no abdominal tenderness. There is no guarding.  Musculoskeletal:        General: Normal range of motion.     Cervical back: Normal range of motion and neck supple.  Skin:    General: Skin is warm and dry.  Neurological:     Mental Status: She is alert and oriented to person, place, and time.  Psychiatric:        Mood and Affect: Mood normal.        Thought Content: Thought content normal.        Judgment: Judgment normal.    UC Treatments / Results  Labs (all labs ordered are listed, but only abnormal results are displayed) Labs Reviewed - No data to display  EKG   Radiology No results found.  Procedures Procedures (including critical care time)  Medications Ordered in UC Medications - No data to display  Initial Impression / Assessment and Plan / UC Course  I have reviewed the triage vital signs and the nursing notes.  Pertinent labs & imaging results that were available during my care of the patient were reviewed by me and considered in my medical decision making (see chart for details).     Consistent with bronchitis, suspect some underlying COPD changes.  Will treat with prednisone burst, albuterol inhaler for as needed use, Mucinex, over-the-counter cough syrups.  Discussed supportive home care and strict return precautions for acutely worsening symptoms.  Follow-up with PCP for lung recheck next week.  Home COVID test were negative, recheck declined.  Final  Clinical Impressions(s) / UC Diagnoses   Final diagnoses:  Bronchitis  Cigarette smoker   Discharge Instructions   None    ED Prescriptions    Medication Sig Dispense Auth. Provider   albuterol (VENTOLIN HFA) 108 (90 Base) MCG/ACT inhaler Inhale 1-2 puffs into the lungs every 6 (six) hours as needed for wheezing or shortness of breath. 18 g Particia Nearing, [place]   predniSONE (DELTASONE) 20 MG tablet Take 2 tablets (40 mg total) by mouth daily with breakfast. 10 tablet Particia Nearing, [place]     PDMP not reviewed this encounter.   Particia Nearing, [place] 09/12/20 1208

## 2020-09-27 ENCOUNTER — Other Ambulatory Visit: Payer: Self-pay | Admitting: Internal Medicine

## 2020-09-27 DIAGNOSIS — I10 Essential (primary) hypertension: Secondary | ICD-10-CM

## 2020-09-27 DIAGNOSIS — B20 Human immunodeficiency virus [HIV] disease: Secondary | ICD-10-CM

## 2020-10-17 ENCOUNTER — Other Ambulatory Visit: Payer: Self-pay | Admitting: Family

## 2020-10-17 DIAGNOSIS — B20 Human immunodeficiency virus [HIV] disease: Secondary | ICD-10-CM

## 2020-12-13 ENCOUNTER — Encounter: Payer: Self-pay | Admitting: Internal Medicine

## 2020-12-21 ENCOUNTER — Other Ambulatory Visit: Payer: Medicaid Other

## 2020-12-21 ENCOUNTER — Ambulatory Visit: Payer: Medicaid Other

## 2021-01-25 ENCOUNTER — Other Ambulatory Visit: Payer: Self-pay | Admitting: Internal Medicine

## 2021-01-25 DIAGNOSIS — I10 Essential (primary) hypertension: Secondary | ICD-10-CM

## 2021-01-25 DIAGNOSIS — B20 Human immunodeficiency virus [HIV] disease: Secondary | ICD-10-CM

## 2021-01-25 NOTE — Telephone Encounter (Signed)
Please advise if okay to refill amlodipine.

## 2021-02-22 ENCOUNTER — Ambulatory Visit: Payer: Medicaid Other | Admitting: Internal Medicine

## 2021-02-27 ENCOUNTER — Ambulatory Visit: Payer: Medicaid Other | Admitting: Internal Medicine

## 2021-04-03 ENCOUNTER — Telehealth: Payer: Self-pay

## 2021-04-03 ENCOUNTER — Ambulatory Visit: Payer: Medicaid Other | Admitting: Internal Medicine

## 2021-04-03 ENCOUNTER — Other Ambulatory Visit: Payer: Self-pay | Admitting: Internal Medicine

## 2021-04-03 ENCOUNTER — Other Ambulatory Visit: Payer: Self-pay

## 2021-04-03 DIAGNOSIS — B20 Human immunodeficiency virus [HIV] disease: Secondary | ICD-10-CM

## 2021-04-03 DIAGNOSIS — I10 Essential (primary) hypertension: Secondary | ICD-10-CM

## 2021-04-03 MED ORDER — AMLODIPINE BESYLATE 5 MG PO TABS: 5.0000 mg | ORAL_TABLET | Freq: Every day | ORAL | 0 refills | Status: DC

## 2021-04-03 MED ORDER — TRIUMEQ 600-50-300 MG PO TABS: 1.0000 | ORAL_TABLET | Freq: Every day | ORAL | 0 refills | Status: DC

## 2021-04-03 NOTE — Telephone Encounter (Signed)
Patient called to reschedule an appointment she had today due to work schedule, she did reschedule for 12/27 but is out of medication and will need a refill until the appointment  for Triumeq, Prezcobix and amLODipine at [facility] on [address] If you have any questions best contact number is [phone]

## 2021-04-16 ENCOUNTER — Other Ambulatory Visit: Payer: Self-pay

## 2021-04-16 ENCOUNTER — Encounter: Payer: Self-pay | Admitting: Internal Medicine

## 2021-04-16 ENCOUNTER — Ambulatory Visit (INDEPENDENT_AMBULATORY_CARE_PROVIDER_SITE_OTHER): Payer: Medicaid Other | Admitting: Internal Medicine

## 2021-04-16 VITALS — BP 159/102 | HR 83 | Temp 97.2°F | Wt 146.6 lb

## 2021-04-16 DIAGNOSIS — Z113 Encounter for screening for infections with a predominantly sexual mode of transmission: Secondary | ICD-10-CM

## 2021-04-16 DIAGNOSIS — Z23 Encounter for immunization: Secondary | ICD-10-CM | POA: Diagnosis not present

## 2021-04-16 DIAGNOSIS — B20 Human immunodeficiency virus [HIV] disease: Secondary | ICD-10-CM

## 2021-04-16 DIAGNOSIS — I10 Essential (primary) hypertension: Secondary | ICD-10-CM

## 2021-04-16 DIAGNOSIS — Z720 Tobacco use: Secondary | ICD-10-CM

## 2021-04-17 ENCOUNTER — Encounter: Payer: Self-pay | Admitting: Internal Medicine

## 2021-04-17 NOTE — Progress Notes (Signed)
° °  Subjective:    Patient ID: Courtney Ellis, female    DOB: [birth year], 60 y.o.   MRN: [id number]  HPI Here for follow up of HIV She continues on Triumeq and Prezcobix and does continue to miss some doses.  Her last CD4 was down to 341 and viral load continues to be detectable at a low level.  Genotype last visit was wild type, no mutations.  BP is elevated today and she has not taken her medications today.    Review of Systems  Constitutional:  Negative for fatigue.  Gastrointestinal:  Negative for diarrhea and nausea.  Skin:  Negative for rash.      Objective:   Physical Exam Eyes:     General: No scleral icterus. Pulmonary:     Effort: Pulmonary effort is normal.  Neurological:     General: No focal deficit present.     Mental Status: She is alert.  Psychiatric:        Mood and Affect: Mood normal.   SH: + tobacco, no drug use       Assessment & Plan:

## 2021-04-17 NOTE — Assessment & Plan Note (Addendum)
I discussed the need for good compliance and the risk of resistance, need for medication if further mutations develop and difficulty of treating.  She voiced her understanding.   rtc in 6 months, depending on lab results.

## 2021-04-17 NOTE — Assessment & Plan Note (Signed)
I discussed smoking cessation and in particular with her high blood pressure and this could greatly improve her HTN and reduce her risk of stroke.  She is contemplative now and will think about quitting.

## 2021-04-17 NOTE — Assessment & Plan Note (Signed)
I encouraged her to take her BP medications and quit smoking.

## 2021-04-18 LAB — HIV-1 RNA QUANT-NO REFLEX-BLD
HIV 1 RNA Quant: 147 Copies/mL — ABNORMAL HIGH
HIV-1 RNA Quant, Log: 2.17 Log cps/mL — ABNORMAL HIGH

## 2021-04-18 LAB — T-HELPER CELLS (CD4) COUNT (NOT AT ARMC)
Absolute CD4: 302 cells/uL — ABNORMAL LOW (ref 490–1740)
CD4 T Helper %: 15 % — ABNORMAL LOW (ref 30–61)
Total lymphocyte count: 1990 cells/uL (ref 850–3900)

## 2021-05-21 ENCOUNTER — Other Ambulatory Visit: Payer: Self-pay | Admitting: Internal Medicine

## 2021-05-21 DIAGNOSIS — I10 Essential (primary) hypertension: Secondary | ICD-10-CM

## 2021-05-21 DIAGNOSIS — B20 Human immunodeficiency virus [HIV] disease: Secondary | ICD-10-CM

## 2021-10-04 ENCOUNTER — Ambulatory Visit: Payer: Medicaid Other | Admitting: Internal Medicine

## 2021-10-09 ENCOUNTER — Ambulatory Visit: Payer: Medicaid Other | Admitting: Internal Medicine

## 2021-10-16 ENCOUNTER — Encounter: Payer: Self-pay | Admitting: Internal Medicine

## 2021-10-16 ENCOUNTER — Ambulatory Visit (INDEPENDENT_AMBULATORY_CARE_PROVIDER_SITE_OTHER): Payer: Medicaid Other | Admitting: Internal Medicine

## 2021-10-16 ENCOUNTER — Other Ambulatory Visit: Payer: Self-pay

## 2021-10-16 VITALS — BP 139/85 | HR 75 | Temp 98.0°F | Wt 141.6 lb

## 2021-10-16 DIAGNOSIS — Z113 Encounter for screening for infections with a predominantly sexual mode of transmission: Secondary | ICD-10-CM

## 2021-10-16 DIAGNOSIS — M25552 Pain in left hip: Secondary | ICD-10-CM | POA: Diagnosis not present

## 2021-10-16 DIAGNOSIS — Z720 Tobacco use: Secondary | ICD-10-CM

## 2021-10-16 DIAGNOSIS — B20 Human immunodeficiency virus [HIV] disease: Secondary | ICD-10-CM | POA: Diagnosis present

## 2021-10-16 DIAGNOSIS — Z79899 Other long term (current) drug therapy: Secondary | ICD-10-CM

## 2021-10-16 NOTE — Assessment & Plan Note (Signed)
Will check her lipid panel today 

## 2021-10-16 NOTE — Assessment & Plan Note (Signed)
Having more groin pain with movement.  I suspect hip related.  She will see orthopedics.

## 2021-10-16 NOTE — Assessment & Plan Note (Signed)
Will screen today 

## 2021-10-16 NOTE — Assessment & Plan Note (Signed)
She continues to do well on Prezcobix and Triumeq and will continue with the same.  Labs today.  No changes indicated and she can rtc in 6 months.

## 2021-10-16 NOTE — Progress Notes (Signed)
   Subjective:    Patient ID: Courtney Ellis, female    DOB: 12-Dec-1960, 61 y.o.   MRN: [id number]  HPI Here for follow up of HIV She continues on Triumeq and Prezcobix and no missed doses.  No issues with getting, taking or tolerating her medication. Having some pain with movement in her left groin area.  Still working on getting housing.    Review of Systems  Constitutional:  Negative for fatigue.  Gastrointestinal:  Negative for diarrhea and nausea.  Skin:  Negative for rash.       Objective:   Physical Exam Eyes:     General: No scleral icterus. Pulmonary:     Effort: Pulmonary effort is normal.  Neurological:     Mental Status: She is alert.  Psychiatric:        Mood and Affect: Mood normal.   SH: cutting down her tobacco use        Assessment & Plan:

## 2021-10-16 NOTE — Assessment & Plan Note (Signed)
Interested in quitting.  enouraged continued efforts.

## 2021-10-17 LAB — T-HELPER CELL (CD4) - (RCID CLINIC ONLY)
CD4 % Helper T Cell: 14 % — ABNORMAL LOW (ref 33–65)
CD4 T Cell Abs: 308 /uL — ABNORMAL LOW (ref 400–1790)

## 2021-10-19 LAB — COMPLETE METABOLIC PANEL WITH GFR
AG Ratio: 1.2 (calc) (ref 1.0–2.5)
ALT: 9 U/L (ref 6–29)
AST: 15 U/L (ref 10–35)
Albumin: 3.7 g/dL (ref 3.6–5.1)
Alkaline phosphatase (APISO): 51 U/L (ref 37–153)
BUN: 13 mg/dL (ref 7–25)
CO2: 26 mmol/L (ref 20–32)
Calcium: 10 mg/dL (ref 8.6–10.4)
Chloride: 104 mmol/L (ref 98–110)
Creat: 0.77 mg/dL (ref 0.50–1.03)
Globulin: 3.2 g/dL (calc) (ref 1.9–3.7)
Glucose, Bld: 92 mg/dL (ref 65–99)
Potassium: 4.4 mmol/L (ref 3.5–5.3)
Sodium: 137 mmol/L (ref 135–146)
Total Bilirubin: 0.3 mg/dL (ref 0.2–1.2)
Total Protein: 6.9 g/dL (ref 6.1–8.1)
eGFR: 89 mL/min/{1.73_m2} (ref >=60)

## 2021-10-19 LAB — CBC WITH DIFFERENTIAL/PLATELET
Absolute Monocytes: 577 cells/uL (ref 200–950)
Basophils Absolute: 39 cells/uL (ref 0–200)
Basophils Relative: 0.7 %
Eosinophils Absolute: 78 cells/uL (ref 15–500)
Eosinophils Relative: 1.4 %
HCT: 38 % (ref 35.0–45.0)
Hemoglobin: 12.9 g/dL (ref 11.7–15.5)
Lymphs Abs: 2744 cells/uL (ref 850–3900)
MCH: 30.6 pg (ref 27.0–33.0)
MCHC: 33.9 g/dL (ref 32.0–36.0)
MCV: 90 fL (ref 80.0–100.0)
MPV: 10.5 fL (ref 7.5–12.5)
Monocytes Relative: 10.3 %
Neutro Abs: 2162 cells/uL (ref 1500–7800)
Neutrophils Relative %: 38.6 %
Platelets: 269 10*3/uL (ref 140–400)
RBC: 4.22 10*6/uL (ref 3.80–5.10)
RDW: 11.9 % (ref 11.0–15.0)
Total Lymphocyte: 49 %
WBC: 5.6 10*3/uL (ref 3.8–10.8)

## 2021-10-19 LAB — LIPID PANEL
Cholesterol: 165 mg/dL (ref <200)
HDL: 33 mg/dL — ABNORMAL LOW (ref >=50)
LDL Cholesterol (Calc): 97 mg/dL (calc)
Non-HDL Cholesterol (Calc): 132 mg/dL (calc) — ABNORMAL HIGH (ref <130)
Total CHOL/HDL Ratio: 5 (calc) — ABNORMAL HIGH (ref <5.0)
Triglycerides: 232 mg/dL — ABNORMAL HIGH (ref <150)

## 2021-10-19 LAB — RPR: RPR Ser Ql: NONREACTIVE

## 2021-10-19 LAB — HIV-1 RNA QUANT-NO REFLEX-BLD
HIV 1 RNA Quant: 116 Copies/mL — ABNORMAL HIGH
HIV-1 RNA Quant, Log: 2.06 Log cps/mL — ABNORMAL HIGH

## 2021-11-27 ENCOUNTER — Other Ambulatory Visit: Payer: Self-pay

## 2021-11-27 MED ORDER — ENSURE PO LIQD: 237.0000 mL | Freq: Two times a day (BID) | ORAL | 5 refills | Status: DC

## 2021-12-24 ENCOUNTER — Other Ambulatory Visit: Payer: Self-pay | Admitting: Internal Medicine

## 2021-12-24 DIAGNOSIS — B20 Human immunodeficiency virus [HIV] disease: Secondary | ICD-10-CM

## 2022-04-07 ENCOUNTER — Other Ambulatory Visit: Payer: Self-pay

## 2022-04-07 MED ORDER — ENSURE PO LIQD: 237.0000 mL | Freq: Two times a day (BID) | ORAL | 5 refills | Status: DC

## 2022-04-23 ENCOUNTER — Ambulatory Visit: Payer: Medicaid Other | Admitting: Internal Medicine

## 2022-05-22 ENCOUNTER — Ambulatory Visit: Payer: Medicaid Other | Admitting: Internal Medicine

## 2022-05-27 ENCOUNTER — Ambulatory Visit (INDEPENDENT_AMBULATORY_CARE_PROVIDER_SITE_OTHER): Payer: Medicaid Other | Admitting: Internal Medicine

## 2022-05-27 ENCOUNTER — Other Ambulatory Visit: Payer: Self-pay

## 2022-05-27 ENCOUNTER — Encounter: Payer: Self-pay | Admitting: Internal Medicine

## 2022-05-27 VITALS — BP 104/65 | HR 80 | Resp 16 | Ht 63.0 in | Wt 144.0 lb

## 2022-05-27 DIAGNOSIS — B20 Human immunodeficiency virus [HIV] disease: Secondary | ICD-10-CM | POA: Diagnosis not present

## 2022-05-27 DIAGNOSIS — F1110 Opioid abuse, uncomplicated: Secondary | ICD-10-CM

## 2022-05-27 DIAGNOSIS — Z79899 Other long term (current) drug therapy: Secondary | ICD-10-CM | POA: Diagnosis not present

## 2022-05-27 DIAGNOSIS — Z23 Encounter for immunization: Secondary | ICD-10-CM | POA: Diagnosis not present

## 2022-05-27 DIAGNOSIS — Z113 Encounter for screening for infections with a predominantly sexual mode of transmission: Secondary | ICD-10-CM

## 2022-05-27 MED ORDER — TRIUMEQ 600-50-300 MG PO TABS: 1.0000 | ORAL_TABLET | Freq: Every day | ORAL | 11 refills | Status: DC

## 2022-05-27 MED ORDER — PREZCOBIX 800-150 MG PO TABS: ORAL_TABLET | ORAL | 11 refills | Status: DC

## 2022-05-27 NOTE — Assessment & Plan Note (Signed)
She continues on suboxone and doing well.

## 2022-05-27 NOTE — Addendum Note (Signed)
Addended by: Caffie Pinto on: 05/27/2022 01:38 PM   Modules accepted: Orders

## 2022-05-27 NOTE — Assessment & Plan Note (Addendum)
She is doing well on her medications and no changes indicated.  Refills provided and previous labs reviewed with her.  Follow up in 6 months.

## 2022-05-27 NOTE — Assessment & Plan Note (Signed)
Will check lipid panel. 

## 2022-05-27 NOTE — Progress Notes (Signed)
   Subjective:    Patient ID: Courtney Ellis, female    DOB: [birth year], 63 y.o.   MRN: [id number]  HPI [name] is here for follow up of HIV She continues on Triumeq and Prezcobix and denies any missed doses.  She has remained suppressed with minimal detection of the viral load.  She unfortunately lost her job recently and her car is not working.  She continues on suboxone and doing well.  Trouble sleeping but got seroquel from the [place] [facility] and will try that.     Review of Systems  Constitutional:  Negative for fatigue.  Gastrointestinal:  Negative for diarrhea and nausea.  Skin:  Negative for rash.       Objective:   Physical Exam Eyes:     General: No scleral icterus. Pulmonary:     Effort: Pulmonary effort is normal.  Neurological:     Mental Status: She is alert.   SH": + tobacco        Assessment & Plan:

## 2022-05-27 NOTE — Assessment & Plan Note (Signed)
Will screen

## 2022-05-28 LAB — T-HELPER CELL (CD4) - (RCID CLINIC ONLY)
CD4 % Helper T Cell: 15 % — ABNORMAL LOW (ref 33–65)
CD4 T Cell Abs: 247 /uL — ABNORMAL LOW (ref 400–1790)

## 2022-05-29 LAB — LIPID PANEL
Cholesterol: 223 mg/dL — ABNORMAL HIGH (ref <200)
HDL: 47 mg/dL — ABNORMAL LOW (ref >=50)
LDL Cholesterol (Calc): 140 mg/dL (calc) — ABNORMAL HIGH
Non-HDL Cholesterol (Calc): 176 mg/dL (calc) — ABNORMAL HIGH (ref <130)
Total CHOL/HDL Ratio: 4.7 (calc) (ref <5.0)
Triglycerides: 219 mg/dL — ABNORMAL HIGH (ref <150)

## 2022-05-29 LAB — COMPLETE METABOLIC PANEL WITH GFR
AG Ratio: 1 (calc) (ref 1.0–2.5)
ALT: 20 U/L (ref 6–29)
AST: 26 U/L (ref 10–35)
Albumin: 3.9 g/dL (ref 3.6–5.1)
Alkaline phosphatase (APISO): 61 U/L (ref 37–153)
BUN: 14 mg/dL (ref 7–25)
CO2: 28 mmol/L (ref 20–32)
Calcium: 10 mg/dL (ref 8.6–10.4)
Chloride: 105 mmol/L (ref 98–110)
Creat: 0.79 mg/dL (ref 0.50–1.05)
Globulin: 4.1 g/dL (calc) — ABNORMAL HIGH (ref 1.9–3.7)
Glucose, Bld: 126 mg/dL — ABNORMAL HIGH (ref 65–99)
Potassium: 4.1 mmol/L (ref 3.5–5.3)
Sodium: 142 mmol/L (ref 135–146)
Total Bilirubin: 0.3 mg/dL (ref 0.2–1.2)
Total Protein: 8 g/dL (ref 6.1–8.1)
eGFR: 86 mL/min/{1.73_m2} (ref >=60)

## 2022-05-29 LAB — CBC WITH DIFFERENTIAL/PLATELET
Absolute Monocytes: 363 cells/uL (ref 200–950)
Basophils Absolute: 33 cells/uL (ref 0–200)
Basophils Relative: 0.6 %
Eosinophils Absolute: 110 cells/uL (ref 15–500)
Eosinophils Relative: 2 %
HCT: 40.8 % (ref 35.0–45.0)
Hemoglobin: 13.7 g/dL (ref 11.7–15.5)
Lymphs Abs: 1848 cells/uL (ref 850–3900)
MCH: 29.5 pg (ref 27.0–33.0)
MCHC: 33.6 g/dL (ref 32.0–36.0)
MCV: 87.7 fL (ref 80.0–100.0)
MPV: 10.2 fL (ref 7.5–12.5)
Monocytes Relative: 6.6 %
Neutro Abs: 3146 cells/uL (ref 1500–7800)
Neutrophils Relative %: 57.2 %
Platelets: 282 10*3/uL (ref 140–400)
RBC: 4.65 10*6/uL (ref 3.80–5.10)
RDW: 12 % (ref 11.0–15.0)
Total Lymphocyte: 33.6 %
WBC: 5.5 10*3/uL (ref 3.8–10.8)

## 2022-05-29 LAB — RPR: RPR Ser Ql: NONREACTIVE

## 2022-05-29 LAB — HIV-1 RNA QUANT-NO REFLEX-BLD
HIV 1 RNA Quant: 78 Copies/mL — ABNORMAL HIGH
HIV-1 RNA Quant, Log: 1.89 Log cps/mL — ABNORMAL HIGH

## 2022-07-24 DIAGNOSIS — G4700 Insomnia, unspecified: Secondary | ICD-10-CM | POA: Diagnosis not present

## 2022-07-24 DIAGNOSIS — F411 Generalized anxiety disorder: Secondary | ICD-10-CM | POA: Diagnosis not present

## 2022-07-24 DIAGNOSIS — F319 Bipolar disorder, unspecified: Secondary | ICD-10-CM | POA: Diagnosis not present

## 2022-07-24 DIAGNOSIS — F1120 Opioid dependence, uncomplicated: Secondary | ICD-10-CM | POA: Diagnosis not present

## 2022-08-06 DIAGNOSIS — F1120 Opioid dependence, uncomplicated: Secondary | ICD-10-CM | POA: Diagnosis not present

## 2022-08-06 DIAGNOSIS — Z560 Unemployment, unspecified: Secondary | ICD-10-CM | POA: Diagnosis not present

## 2022-08-06 DIAGNOSIS — F411 Generalized anxiety disorder: Secondary | ICD-10-CM | POA: Diagnosis not present

## 2022-08-12 ENCOUNTER — Other Ambulatory Visit: Payer: Self-pay

## 2022-08-12 DIAGNOSIS — B20 Human immunodeficiency virus [HIV] disease: Secondary | ICD-10-CM

## 2022-08-12 MED ORDER — ENSURE PO LIQD
237.0000 mL | Freq: Two times a day (BID) | ORAL | 5 refills | Status: DC
Start: 2022-08-12 — End: 2022-12-23

## 2022-08-27 ENCOUNTER — Emergency Department (HOSPITAL_COMMUNITY): Payer: 59

## 2022-08-27 ENCOUNTER — Emergency Department (HOSPITAL_COMMUNITY)
Admission: EM | Admit: 2022-08-27 | Discharge: 2022-08-27 | Disposition: A | Discharge status: Home / Self Care | Payer: 59 | Attending: Emergency Medicine | Admitting: Emergency Medicine

## 2022-08-27 ENCOUNTER — Other Ambulatory Visit: Payer: Self-pay

## 2022-08-27 DIAGNOSIS — M79602 Pain in left arm: Secondary | ICD-10-CM | POA: Diagnosis not present

## 2022-08-27 DIAGNOSIS — Z79899 Other long term (current) drug therapy: Secondary | ICD-10-CM | POA: Diagnosis not present

## 2022-08-27 DIAGNOSIS — Y92410 Unspecified street and highway as the place of occurrence of the external cause: Secondary | ICD-10-CM | POA: Insufficient documentation

## 2022-08-27 DIAGNOSIS — I10 Essential (primary) hypertension: Secondary | ICD-10-CM | POA: Insufficient documentation

## 2022-08-27 DIAGNOSIS — M542 Cervicalgia: Secondary | ICD-10-CM | POA: Diagnosis not present

## 2022-08-27 DIAGNOSIS — Z21 Asymptomatic human immunodeficiency virus [HIV] infection status: Secondary | ICD-10-CM | POA: Diagnosis not present

## 2022-08-27 DIAGNOSIS — S5012XA Contusion of left forearm, initial encounter: Secondary | ICD-10-CM | POA: Insufficient documentation

## 2022-08-27 DIAGNOSIS — Z041 Encounter for examination and observation following transport accident: Secondary | ICD-10-CM | POA: Diagnosis not present

## 2022-08-27 DIAGNOSIS — M79631 Pain in right forearm: Secondary | ICD-10-CM | POA: Diagnosis not present

## 2022-08-27 DIAGNOSIS — R600 Localized edema: Secondary | ICD-10-CM | POA: Diagnosis not present

## 2022-08-27 DIAGNOSIS — M79601 Pain in right arm: Secondary | ICD-10-CM | POA: Diagnosis not present

## 2022-08-27 DIAGNOSIS — S199XXA Unspecified injury of neck, initial encounter: Secondary | ICD-10-CM | POA: Diagnosis not present

## 2022-08-27 DIAGNOSIS — S5011XA Contusion of right forearm, initial encounter: Secondary | ICD-10-CM | POA: Diagnosis not present

## 2022-08-27 DIAGNOSIS — S59911A Unspecified injury of right forearm, initial encounter: Secondary | ICD-10-CM | POA: Diagnosis not present

## 2022-08-27 DIAGNOSIS — M791 Myalgia, unspecified site: Secondary | ICD-10-CM | POA: Diagnosis not present

## 2022-08-27 DIAGNOSIS — S0990XA Unspecified injury of head, initial encounter: Secondary | ICD-10-CM | POA: Insufficient documentation

## 2022-08-27 DIAGNOSIS — M7918 Myalgia, other site: Secondary | ICD-10-CM

## 2022-08-27 MED ORDER — DIAZEPAM 5 MG PO TABS
5.0000 mg | ORAL_TABLET | Freq: Once | ORAL | Status: AC
  Administered 2022-08-27: 5 mg via ORAL
  Filled 2022-08-27: qty 1

## 2022-08-27 MED ORDER — CYCLOBENZAPRINE HCL 10 MG PO TABS: 10.0000 mg | ORAL_TABLET | Freq: Three times a day (TID) | ORAL | 0 refills | Status: AC

## 2022-08-27 MED ORDER — NAPROXEN 375 MG PO TABS: 375.0000 mg | ORAL_TABLET | Freq: Two times a day (BID) | ORAL | 0 refills | Status: AC
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
## 2022-08-27 NOTE — ED Notes (Signed)
Pt in imaging at this time.

## 2022-08-27 NOTE — Discharge Instructions (Addendum)
I have prescribed muscle relaxers for your pain, please do not drink or drive while taking this medications as it can make you drowsy.    I have also prescribed antiinflammatories, please take 1 tablet twice a day with food to help with your symptoms.
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
## 2022-09-03 ENCOUNTER — Telehealth: Payer: Self-pay

## 2022-09-03 NOTE — Telephone Encounter (Signed)
..   Medicaid Managed Care   Unsuccessful Outreach Note  09/03/2022 Name: Courtney Ellis MRN: [id number] DOB: [birth year]  Referred by: System, Provider Not In Reason for referral : Appointment (I called the patient today to get her scheduled with the MM LCSW. I left my name and number on her voicemail.)   An unsuccessful telephone outreach was attempted today. The patient was referred to the case management team for assistance with care management and care coordination.   Follow Up Plan: A HIPAA compliant phone message was left for the patient providing contact information and requesting a return call.  The care management team will reach out to the patient again over the next 7 days.   Weston Settle Care Guide  [facility] Managed  Care Guide [facility]  [phone]

## 2022-09-03 NOTE — Transitions of Care (Post Inpatient/ED Visit) (Signed)
   09/03/2022  Name: BREIGH MCKEAG MRN: [id number] DOB: 02-Jul-1960  Today's TOC FU Call Status: Today's TOC FU Call Status:: Unsuccessul Call (1st Attempt) Unsuccessful Call (1st Attempt) Date: 09/03/22  Red on EMMI-ED Discharge Alert Date & Reason:08/29/22 "Scheduled follow-up appt? No"   Attempted to reach the patient regarding the most recent Inpatient/ED visit.  Follow Up Plan: Additional outreach attempts will be made to reach the patient to complete the Transitions of Care (Post Inpatient/ED visit) call.   Antionette Fairy, RN,BSN,CCM [facility] Care Management Care Management Community Coordinator Direct Phone: [phone] Toll Free: [phone] Fax: [phone]

## 2022-09-04 ENCOUNTER — Telehealth: Payer: Self-pay
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
## 2022-09-04 NOTE — Transitions of Care (Post Inpatient/ED Visit) (Signed)
   09/04/2022  Name: ALEXI ZASTOUPIL MRN: [id number] DOB: 1960/11/17  Today's TOC FU Call Status: Today's TOC FU Call Status:: Unsuccessful Call (2nd Attempt) Unsuccessful Call (2nd Attempt) Date: 09/04/22  Attempted to reach the patient regarding the most recent Inpatient/ED visit.  Follow Up Plan: Additional outreach attempts will be made to reach the patient to complete the Transitions of Care (Post Inpatient/ED visit) call.      Antionette Fairy, RN,BSN,CCM [facility] Care Management Care Management Community Coordinator Direct Phone: [phone] Toll Free: [phone] Fax: [phone]

## 2022-09-11 ENCOUNTER — Other Ambulatory Visit: Payer: 59

## 2022-09-11 NOTE — Patient Outreach (Signed)
  Medicaid Managed Care   Unsuccessful Outreach Note  09/11/2022 Name: Courtney Ellis MRN: [id number] DOB: 13-Jun-1960  Referred by: System, Provider Not In Reason for referral : High Risk Managed Medicaid (MM Social work unsuccessful telephone outreach )   An unsuccessful telephone outreach was attempted today. The patient was referred to the case management team for assistance with care management and care coordination.   Follow Up Plan: The care management team will reach out to the patient again over the next 7-10 days.   Abelino Derrick, MHA [facility]  Managed [facility] Social Worker [phone]

## 2022-09-11 NOTE — Patient Instructions (Signed)
  Medicaid Managed Care   Unsuccessful Outreach Note  09/11/2022 Name: Courtney Ellis MRN: [id number] DOB: 02/01/1961  Referred by: System, Provider Not In Reason for referral : High Risk Managed Medicaid (MM Social work unsuccessful telephone outreach )   An unsuccessful telephone outreach was attempted today. The patient was referred to the case management team for assistance with care management and care coordination.   Follow Up Plan: The care management team will reach out to the patient again over the next 7-10 days.   Aidaly Cordner, BSW, MHA [place]  Managed Medicaid Social Worker [phone]  

## 2022-09-24 ENCOUNTER — Other Ambulatory Visit: Payer: 59

## 2022-09-24 NOTE — Patient Outreach (Signed)
  Medicaid Managed Care   Unsuccessful Outreach Note  09/24/2022 Name: Courtney Ellis MRN: [id number] DOB: 03/12/1961  Referred by: System, Provider Not In Reason for referral : High Risk Managed Medicaid (MM Social work telephone outreach )   A second unsuccessful telephone outreach was attempted today. The patient was referred to the case management team for assistance with care management and care coordination.   Follow Up Plan: The care management team will reach out to the patient again over the next 7-10 days.   Demarco Bacci, BSW, MHA [place]  Managed Medicaid Social Worker [phone]  

## 2022-09-24 NOTE — Patient Instructions (Signed)
  Medicaid Managed Care   Unsuccessful Outreach Note  09/24/2022 Name: Courtney Ellis MRN: [id number] DOB: June 01, 1960  Referred by: System, Provider Not In Reason for referral : High Risk Managed Medicaid (MM Social work telephone outreach )   A second unsuccessful telephone outreach was attempted today. The patient was referred to the case management team for assistance with care management and care coordination.   Follow Up Plan: The care management team will reach out to the patient again over the next 7-10 days.   Abelino Derrick, MHA [facility]  Managed [facility] Social Worker [phone]

## 2022-10-02 NOTE — Progress Notes (Signed)
The 10-year ASCVD risk score (Arnett DK, et al., 2019) is: 30.4%   Values used to calculate the score:     Age: 62 years     Sex: Female     Is Non-Hispanic African American: Yes     Diabetic: No     Tobacco smoker: Yes     Systolic Blood Pressure: 165 mmHg     Is BP treated: Yes     HDL Cholesterol: 47 mg/dL     Total Cholesterol: 223 mg/dL  Sandie Ano, RN

## 2022-10-03 ENCOUNTER — Other Ambulatory Visit: Payer: 59

## 2022-10-03 NOTE — Patient Instructions (Signed)
  Medicaid Managed Care   Unsuccessful Outreach Note  10/03/2022 Name: Courtney Ellis MRN: [id number] DOB: 05/21/1960  Referred by: System, Provider Not In Reason for referral : High Risk Managed Medicaid (MM Social work Unsuccessful telephone outreach )   Third unsuccessful telephone outreach was attempted today. The patient was referred to the case management team for assistance with care management and care coordination. The patient's primary care provider has been notified of our unsuccessful attempts to make or maintain contact with the patient. The care management team is pleased to engage with this patient at any time in the future should he/she be interested in assistance from the care management team.   Follow Up Plan: The patient has been provided with contact information for the care management team and has been advised to call with any health related questions or concerns.   Amelia Burgard, BSW, MHA [place]  Managed Medicaid Social Worker [phone]  

## 2022-10-03 NOTE — Patient Outreach (Signed)
  Medicaid Managed Care   Unsuccessful Outreach Note  10/03/2022 Name: ISAMARI CASADAY MRN: [id number] DOB: 10-07-1960  Referred by: System, Provider Not In Reason for referral : High Risk Managed Medicaid (MM Social work Unsuccessful telephone outreach )   Third unsuccessful telephone outreach was attempted today. The patient was referred to the case management team for assistance with care management and care coordination. The patient's primary care provider has been notified of our unsuccessful attempts to make or maintain contact with the patient. The care management team is pleased to engage with this patient at any time in the future should he/she be interested in assistance from the care management team.   Follow Up Plan: The patient has been provided with contact information for the care management team and has been advised to call with any health related questions or concerns.   Abelino Derrick, MHA [facility]  Managed [facility] Social Worker [phone]

## 2022-10-19 DIAGNOSIS — F1120 Opioid dependence, uncomplicated: Secondary | ICD-10-CM | POA: Diagnosis not present

## 2022-10-19 DIAGNOSIS — G4700 Insomnia, unspecified: Secondary | ICD-10-CM | POA: Diagnosis not present

## 2022-10-19 DIAGNOSIS — F411 Generalized anxiety disorder: Secondary | ICD-10-CM | POA: Diagnosis not present

## 2022-10-19 DIAGNOSIS — Z560 Unemployment, unspecified: Secondary | ICD-10-CM | POA: Diagnosis not present

## 2022-10-24 DIAGNOSIS — F411 Generalized anxiety disorder: Secondary | ICD-10-CM | POA: Diagnosis not present

## 2022-10-24 DIAGNOSIS — F1120 Opioid dependence, uncomplicated: Secondary | ICD-10-CM | POA: Diagnosis not present

## 2022-10-24 DIAGNOSIS — G4700 Insomnia, unspecified: Secondary | ICD-10-CM | POA: Diagnosis not present

## 2022-10-24 DIAGNOSIS — Z560 Unemployment, unspecified: Secondary | ICD-10-CM | POA: Diagnosis not present

## 2022-11-25 ENCOUNTER — Ambulatory Visit: Payer: Medicaid Other | Admitting: Internal Medicine

## 2022-12-01 ENCOUNTER — Ambulatory Visit: Payer: Medicaid Other | Admitting: Internal Medicine

## 2022-12-23 ENCOUNTER — Other Ambulatory Visit: Payer: Self-pay

## 2022-12-23 DIAGNOSIS — B20 Human immunodeficiency virus [HIV] disease: Secondary | ICD-10-CM

## 2022-12-23 MED ORDER — ENSURE PO LIQD
237.0000 mL | Freq: Two times a day (BID) | ORAL | 5 refills | Status: DC
Start: 2022-12-23 — End: 2022-12-23

## 2022-12-23 MED ORDER — ENSURE PO LIQD
237.0000 mL | Freq: Two times a day (BID) | ORAL | 5 refills | Status: DC
Start: 2022-12-23 — End: 2023-01-15

## 2023-01-13 ENCOUNTER — Telehealth: Payer: Self-pay

## 2023-01-13 ENCOUNTER — Ambulatory Visit: Payer: Medicaid Other | Admitting: Internal Medicine

## 2023-01-13 NOTE — Telephone Encounter (Signed)
Attempted to contact to reschedule No Show with Dr Luciana Axe 01/13/23 but unable to reach or leave message.

## 2023-01-15 ENCOUNTER — Other Ambulatory Visit: Payer: Self-pay

## 2023-01-15 DIAGNOSIS — B20 Human immunodeficiency virus [HIV] disease: Secondary | ICD-10-CM

## 2023-01-15 MED ORDER — ENSURE PO LIQD
237.0000 mL | Freq: Two times a day (BID) | ORAL | 5 refills | Status: DC
Start: 2023-01-15 — End: 2023-01-21

## 2023-01-21 ENCOUNTER — Other Ambulatory Visit: Payer: Self-pay

## 2023-01-21 DIAGNOSIS — B20 Human immunodeficiency virus [HIV] disease: Secondary | ICD-10-CM

## 2023-01-21 MED ORDER — ENSURE PO LIQD
237.0000 mL | Freq: Two times a day (BID) | ORAL | 5 refills | Status: DC
Start: 2023-01-21 — End: 2023-01-21

## 2023-01-21 MED ORDER — ENSURE PO LIQD: 237.0000 mL | Freq: Two times a day (BID) | ORAL | 5 refills | Status: DC

## 2023-01-21 NOTE — Addendum Note (Signed)
Addended by: Tressa Busman T on: 01/21/2023 08:53 AM   Modules accepted: Orders

## 2023-02-20 ENCOUNTER — Telehealth: Payer: Self-pay

## 2023-02-20 ENCOUNTER — Ambulatory Visit: Payer: Medicaid Other | Admitting: Internal Medicine

## 2023-02-20 NOTE — Telephone Encounter (Signed)
Attempted to call patient regarding appointment today. Will need to reschedule. Left voicemail. Juanita Laster, RMA

## 2023-03-12 ENCOUNTER — Ambulatory Visit: Payer: Medicaid Other | Admitting: Internal Medicine

## 2023-03-13 ENCOUNTER — Ambulatory Visit: Payer: Medicaid Other | Admitting: Internal Medicine

## 2023-04-08 ENCOUNTER — Ambulatory Visit: Payer: 59 | Admitting: Dermatology

## 2023-04-09 ENCOUNTER — Ambulatory Visit: Payer: Medicaid Other | Admitting: Internal Medicine

## 2023-04-24 ENCOUNTER — Encounter: Payer: Self-pay | Admitting: Internal Medicine

## 2023-04-24 ENCOUNTER — Ambulatory Visit: Payer: Medicaid Other | Admitting: Internal Medicine

## 2023-04-24 ENCOUNTER — Other Ambulatory Visit: Payer: Self-pay

## 2023-04-24 VITALS — BP 133/81 | HR 90 | Temp 97.3°F | Wt 141.0 lb

## 2023-04-24 DIAGNOSIS — B20 Human immunodeficiency virus [HIV] disease: Secondary | ICD-10-CM

## 2023-04-24 DIAGNOSIS — M25552 Pain in left hip: Secondary | ICD-10-CM | POA: Insufficient documentation

## 2023-04-24 DIAGNOSIS — Z113 Encounter for screening for infections with a predominantly sexual mode of transmission: Secondary | ICD-10-CM

## 2023-04-24 DIAGNOSIS — Z79899 Other long term (current) drug therapy: Secondary | ICD-10-CM

## 2023-04-24 DIAGNOSIS — Z23 Encounter for immunization: Secondary | ICD-10-CM

## 2023-04-24 MED ORDER — PREZCOBIX 800-150 MG PO TABS: ORAL_TABLET | ORAL | 11 refills | Status: DC

## 2023-04-24 MED ORDER — TRIUMEQ 600-50-300 MG PO TABS: 1.0000 | ORAL_TABLET | Freq: Every day | ORAL | 11 refills | Status: DC

## 2023-04-24 NOTE — Assessment & Plan Note (Signed)
 She continues doing well and no changes indicted Labs today and can follow up in 6 months

## 2023-04-24 NOTE — Assessment & Plan Note (Signed)
 New problem.  Will refer her to Sports Medicine

## 2023-04-24 NOTE — Assessment & Plan Note (Signed)
Will screen 

## 2023-04-24 NOTE — Progress Notes (Signed)
   Subjective:    Patient ID: Courtney Ellis, female    DOB: February 01, 1961, 63 y.o.   MRN: [id number]  HPI Courtney Ellis is here for follow-up of HIV. She continues on Prezcobix  and Triumeq  and denies any missed doses.  She has had no issues with getting or taking the medication.  Complains of new left hip pain over the last 2 months.  She has difficulty with flexion and external rotation.  Difficulty with walking due to the pain in the hip.   Review of Systems  Constitutional:  Negative for fatigue.  Gastrointestinal:  Negative for diarrhea.  Skin:  Negative for rash.       Objective:   Physical Exam Eyes:     General: No scleral icterus. Pulmonary:     Effort: Pulmonary effort is normal.  Neurological:     Mental Status: She is alert.   SH: + tobacco        Assessment & Plan:

## 2023-04-29 ENCOUNTER — Telehealth: Payer: Self-pay

## 2023-04-29 NOTE — Telephone Encounter (Signed)
 Called [facility] to add on genotype. Spoke with Elnita Maxwell who was able to add on lab.  Juanita Laster, RMA

## 2023-04-29 NOTE — Telephone Encounter (Signed)
-----   Message from Belia Heman Comer sent at 04/29/2023  3:23 PM EST ----- Can we get a genotype on her recent viral load? thanks

## 2023-05-01 LAB — T-HELPER CELLS (CD4) COUNT (NOT AT ARMC)
Absolute CD4: 351 {cells}/uL — ABNORMAL LOW (ref 490–1740)
CD4 T Helper %: 21 % — ABNORMAL LOW (ref 30–61)
Total lymphocyte count: 1696 {cells}/uL (ref 850–3900)

## 2023-05-01 LAB — LIPID PANEL
Cholesterol: 199 mg/dL (ref <200)
HDL: 38 mg/dL — ABNORMAL LOW (ref >=50)
LDL Cholesterol (Calc): 131 mg/dL — ABNORMAL HIGH
Non-HDL Cholesterol (Calc): 161 mg/dL — ABNORMAL HIGH (ref <130)
Total CHOL/HDL Ratio: 5.2 (calc) — ABNORMAL HIGH (ref <5.0)
Triglycerides: 160 mg/dL — ABNORMAL HIGH (ref <150)

## 2023-05-01 LAB — CBC WITH DIFFERENTIAL/PLATELET
Absolute Lymphocytes: 1626 {cells}/uL (ref 850–3900)
Absolute Monocytes: 508 {cells}/uL (ref 200–950)
Basophils Absolute: 28 {cells}/uL (ref 0–200)
Basophils Relative: 0.6 %
Eosinophils Absolute: 108 {cells}/uL (ref 15–500)
Eosinophils Relative: 2.3 %
HCT: 37.9 % (ref 35.0–45.0)
Hemoglobin: 12.1 g/dL (ref 11.7–15.5)
MCH: 28.3 pg (ref 27.0–33.0)
MCHC: 31.9 g/dL — ABNORMAL LOW (ref 32.0–36.0)
MCV: 88.8 fL (ref 80.0–100.0)
MPV: 11.1 fL (ref 7.5–12.5)
Monocytes Relative: 10.8 %
Neutro Abs: 2430 {cells}/uL (ref 1500–7800)
Neutrophils Relative %: 51.7 %
Platelets: 217 10*3/uL (ref 140–400)
RBC: 4.27 10*6/uL (ref 3.80–5.10)
RDW: 12 % (ref 11.0–15.0)
Total Lymphocyte: 34.6 %
WBC: 4.7 10*3/uL (ref 3.8–10.8)

## 2023-05-01 LAB — HIV-1 RNA QUANT-NO REFLEX-BLD
HIV 1 RNA Quant: 4800 {copies}/mL — ABNORMAL HIGH
HIV-1 RNA Quant, Log: 3.68 {Log} — ABNORMAL HIGH

## 2023-05-01 LAB — TEST AUTHORIZATION

## 2023-05-01 LAB — RPR: RPR Ser Ql: NONREACTIVE

## 2023-05-01 LAB — C. TRACHOMATIS/N. GONORRHOEAE RNA
C. trachomatis RNA, TMA: NOT DETECTED
N. gonorrhoeae RNA, TMA: NOT DETECTED

## 2023-05-01 LAB — HIV RNA, RTPCR W/R GT (RTI, PI,INT)

## 2023-05-15 ENCOUNTER — Ambulatory Visit: Payer: Medicaid Other | Admitting: Family Medicine

## 2023-08-11 ENCOUNTER — Encounter (HOSPITAL_COMMUNITY): Payer: Self-pay | Admitting: *Deleted

## 2023-08-11 ENCOUNTER — Emergency Department (HOSPITAL_COMMUNITY)
Admission: EM | Admit: 2023-08-11 | Discharge: 2023-08-12 | Disposition: A | Discharge status: Home / Self Care | Attending: Emergency Medicine | Admitting: Emergency Medicine

## 2023-08-11 ENCOUNTER — Other Ambulatory Visit: Payer: Self-pay

## 2023-08-11 DIAGNOSIS — M9905 Segmental and somatic dysfunction of pelvic region: Secondary | ICD-10-CM | POA: Insufficient documentation

## 2023-08-11 DIAGNOSIS — M6289 Other specified disorders of muscle: Secondary | ICD-10-CM

## 2023-08-11 NOTE — ED Triage Notes (Signed)
 Pt has been having difficulty urinating and some lower abdominal pain and when she voids her urine comes out toward the back in her gluteal fold.  Pt also states that she saw something pink in her vagina.

## 2023-08-11 NOTE — ED Provider Triage Note (Signed)
 Emergency Medicine Provider Triage Evaluation Note  Courtney Ellis , a 63 y.o. female  was evaluated in triage.  Pt complains of something falling out of her vagina for the past couple days.  States it looks pink.  Reports more difficulty with urination.  Denies any dysuria, hematuria, or increase frequency.  Denies any abdominal pain.  Review of Systems  Positive: As above Negative: As above  Physical Exam  BP (!) 107/90 (BP Location: Right Arm)   Pulse 84   Temp 98.6 F (37 C)   Resp 18   LMP 09/08/2011   SpO2 100%  Gen:   Awake, no distress   Resp:  Normal effort  MSK:   Moves extremities without difficulty    Medical Decision Making  Medically screening exam initiated at 2:33 PM.  Appropriate orders placed.  Serayah Yazdani Emigh was informed that the remainder of the evaluation will be completed by another provider, this initial triage assessment does not replace that evaluation, and the importance of remaining in the ED until their evaluation is complete.     Rexie Catena, PA-C 08/11/23 1434

## 2023-08-11 NOTE — ED Notes (Signed)
 Pt refused vitals

## 2023-08-12 ENCOUNTER — Telehealth: Payer: Self-pay

## 2023-08-12 LAB — URINALYSIS, ROUTINE W REFLEX MICROSCOPIC
Bacteria, UA: NONE SEEN
Bilirubin Urine: NEGATIVE
Glucose, UA: NEGATIVE mg/dL
Hgb urine dipstick: NEGATIVE
Ketones, ur: NEGATIVE mg/dL
Leukocytes,Ua: NEGATIVE
Nitrite: NEGATIVE
Protein, ur: 100 mg/dL — AB
Specific Gravity, Urine: 1.013 (ref 1.005–1.030)
pH: 6 (ref 5.0–8.0)

## 2023-08-12 LAB — COMPREHENSIVE METABOLIC PANEL WITH GFR
ALT: 19 U/L (ref 0–44)
AST: 27 U/L (ref 15–41)
Albumin: 3.4 g/dL — ABNORMAL LOW (ref 3.5–5.0)
Alkaline Phosphatase: 78 U/L (ref 38–126)
Anion gap: 11 (ref 5–15)
BUN: 13 mg/dL (ref 8–23)
CO2: 24 mmol/L (ref 22–32)
Calcium: 9.8 mg/dL (ref 8.9–10.3)
Chloride: 101 mmol/L (ref 98–111)
Creatinine, Ser: 0.83 mg/dL (ref 0.44–1.00)
GFR, Estimated: >60 mL/min (ref >60)
Glucose, Bld: 106 mg/dL — ABNORMAL HIGH (ref 70–99)
Potassium: 4 mmol/L (ref 3.5–5.1)
Sodium: 136 mmol/L (ref 135–145)
Total Bilirubin: 0.5 mg/dL (ref 0.0–1.2)
Total Protein: 8.9 g/dL — ABNORMAL HIGH (ref 6.5–8.1)

## 2023-08-12 LAB — CBC
HCT: 42.7 % (ref 36.0–46.0)
Hemoglobin: 13.7 g/dL (ref 12.0–15.0)
MCH: 28.7 pg (ref 26.0–34.0)
MCHC: 32.1 g/dL (ref 30.0–36.0)
MCV: 89.3 fL (ref 80.0–100.0)
Platelets: 383 10*3/uL (ref 150–400)
RBC: 4.78 MIL/uL (ref 3.87–5.11)
RDW: 12.1 % (ref 11.5–15.5)
WBC: 7.4 10*3/uL (ref 4.0–10.5)
nRBC: 0 % (ref 0.0–0.2)

## 2023-08-12 NOTE — ED Notes (Signed)
 Pelvic cart at bedside.

## 2023-08-12 NOTE — Discharge Instructions (Signed)
 Return for any problem.   Follow up with GYN.

## 2023-08-12 NOTE — ED Provider Notes (Signed)
 [place] EMERGENCY DEPARTMENT AT [facility] Provider Note   CSN: [id number] Arrival date & time: 08/11/23  1348     History  No chief complaint on file.   Courtney Ellis is a 63 y.o. female.  63 year old female with prior medical history as detailed below presents for evaluation.  Patient presents from urgent care.  She is concerned about possible vaginal prolapse.    The history is provided by the patient.       Home Medications Prior to Admission medications   Medication Sig Start Date End Date Taking? Authorizing Provider  abacavir -dolutegravir -lamiVUDine  (TRIUMEQ ) 600-50-300 MG tablet Take 1 tablet by mouth daily. 04/24/23   Comer, Robert W, MD  albuterol  (VENTOLIN  HFA) 108 [phone] Base) MCG/ACT inhaler Inhale 1-2 puffs into the lungs every 6 (six) hours as needed for wheezing or shortness of breath. 09/12/20   Corbin Dess, PA-C  amLODipine  (NORVASC ) 5 MG tablet TAKE ONE TABLET BY MOUTH DAILY 05/28/21   Comer, Judithann Novas, MD  darunavir -cobicistat  (PREZCOBIX ) 800-150 MG tablet TAKE ONE TABLET BY MOUTH DAILY . SWALLOW WHOLE. DO NOT CRUSH, BREAK, OR CHEW TABLETS. TAKE WITH FOOD. 04/24/23   Comer, Judithann Novas, MD  Ensure (ENSURE) Take 237 mLs by mouth 2 (two) times daily between meals. 01/21/23   ComerJudithann Novas, MD  gabapentin (NEURONTIN) 300 MG capsule Take 300 mg by mouth daily. 02/02/20   [provider]  naproxen  sodium (ALEVE ) 220 MG tablet Take 220 mg by mouth daily as needed.    [provider]  SUBOXONE  8-2 MG FILM DISSOLVE ONE FILM UNDER THE TONGUE EVERY 8 HOURS 02/02/19   [provider]      Allergies    Patient has no known allergies.    Review of Systems   Review of Systems  All other systems reviewed and are negative.   Physical Exam Updated Vital Signs BP (!) 177/88 (BP Location: Right Arm)   Pulse 79   Temp 97.8 F (36.6 C) (Oral)   Resp 18   LMP 09/08/2011   SpO2 100%  Physical Exam Vitals and nursing note  reviewed.  Constitutional:      General: She is not in acute distress.    Appearance: Normal appearance. She is well-developed.  HENT:     Head: Normocephalic and atraumatic.  Eyes:     Conjunctiva/sclera: Conjunctivae normal.     Pupils: Pupils are equal, round, and reactive to light.  Cardiovascular:     Rate and Rhythm: Normal rate and regular rhythm.     Heart sounds: Normal heart sounds.  Pulmonary:     Effort: Pulmonary effort is normal. No respiratory distress.     Breath sounds: Normal breath sounds.  Abdominal:     General: There is no distension.     Palpations: Abdomen is soft.     Tenderness: There is no abdominal tenderness.  Genitourinary:    General: Normal vulva.     Comments: Pelvic exam completed with female chaperone present.  No evidence of vaginal prolapse seen on external or speculum exam.  No vaginal discharge appreciated.  Normal cervix.  No mass or discrete lesion identified. Musculoskeletal:        General: No deformity. Normal range of motion.     Cervical back: Normal range of motion and neck supple.  Skin:    General: Skin is warm and dry.  Neurological:     General: No focal deficit present.     Mental Status: She  is alert and oriented to person, place, and time.     ED Results / Procedures / Treatments   Labs (all labs ordered are listed, but only abnormal results are displayed) Labs Reviewed  COMPREHENSIVE METABOLIC PANEL WITH GFR - Abnormal; Notable for the following components:      Result Value   Glucose, Bld 106 (*)    Total Protein 8.9 (*)    Albumin 3.4 (*)    All other components within normal limits  URINALYSIS, ROUTINE W REFLEX MICROSCOPIC - Abnormal; Notable for the following components:   Protein, ur 100 (*)    All other components within normal limits  CBC    EKG None  Radiology No results found.  Procedures Procedures    Medications Ordered in ED Medications - No data to display  ED Course/ Medical Decision  Making/ A&P                                 Medical Decision Making Amount and/or Complexity of Data Reviewed Labs: ordered.    Medical Screen Complete  This patient presented to the ED with complaint of concern for possible vaginal prolapse.  This complaint involves an extensive number of treatment options. The initial differential diagnosis includes, but is not limited to, vaginal prolapse  This presentation is: Chronic and Previously Undiagnosed  Patient is presenting with concern for possible vaginal prolapse.  Her describe symptoms are not entirely consistent with likely vaginal prolapse.  No prolapse identified on external or internal vaginal exam.  Patient screening labs obtained are without significant abnormality.  Patient is referred to GYN for further evaluation.  Importance of close follow-up stressed.  Strict return precautions given and understood.    Co morbidities that complicated the patient's evaluation  See HPI   Additional history obtained:  External records from outside sources obtained and reviewed including prior ED visits and prior Inpatient records.    Problem List / ED Course:  Concern for Vaginal prolapse   Disposition:  After consideration of the diagnostic results and the patients response to treatment, I feel that the patent would benefit from close outpatient followup.          Final Clinical Impression(s) / ED Diagnoses Final diagnoses:  Pelvic floor dysfunction    Rx / DC Orders ED Discharge Orders     None         Burnette Carte, MD 08/12/23 [phone]

## 2023-08-12 NOTE — Telephone Encounter (Signed)
 Patient called stating that she went to [facility] ED due to vaginal prolapse. Patient stated that her urine isn't coming out correctly and she noticed a pink discharge. patient stated that ED wasn't helpful and something has to be done. Informed patient that I would relay message to provider to see what she recommends. Dr.Singh I don't see a referral to OBGYN yet by ED. Do you want me to order?

## 2023-08-18 ENCOUNTER — Encounter: Payer: Self-pay | Admitting: Infectious Diseases

## 2023-08-18 ENCOUNTER — Other Ambulatory Visit (HOSPITAL_COMMUNITY)
Admission: RE | Admit: 2023-08-18 | Discharge: 2023-08-18 | Disposition: A | Discharge status: Home / Self Care | Source: Ambulatory Visit | Attending: Infectious Diseases | Admitting: Infectious Diseases

## 2023-08-18 ENCOUNTER — Ambulatory Visit (INDEPENDENT_AMBULATORY_CARE_PROVIDER_SITE_OTHER): Admitting: Infectious Diseases

## 2023-08-18 ENCOUNTER — Other Ambulatory Visit: Payer: Self-pay

## 2023-08-18 VITALS — BP 142/81 | HR 73 | Temp 98.2°F | Ht 63.0 in | Wt 131.0 lb

## 2023-08-18 DIAGNOSIS — Z124 Encounter for screening for malignant neoplasm of cervix: Secondary | ICD-10-CM

## 2023-08-18 DIAGNOSIS — N819 Female genital prolapse, unspecified: Secondary | ICD-10-CM

## 2023-08-18 NOTE — Assessment & Plan Note (Signed)
 Stage 4, severe pelvic organ prolapse provoked with valsalva. Left adnexal pain ?referred from uterine prolapse.  We discussed this today. I recommended referral to urogynecology for further evaluation. I suspect she will require surgical correction.  She would likely benefit from pelvic floor PT as well.  Information provided about referral.

## 2023-08-18 NOTE — Assessment & Plan Note (Signed)
 Cervical brushing collected for cytology and HPV.  Discussed recommended screening interval for women living with HIV disease meant to be lifelong and at an interval of Q1-3 years pending results. Acceptable to space out Q3y with 3 consecutively normal exams. Further recommendations for Courtney Ellis to follow today's results.  Results will be communicated to the patient via telephone call.

## 2023-08-18 NOTE — Patient Instructions (Addendum)
 I suspect you have uterine prolapse - see attached information.   [facility]  Address: [address], [place], [place] [address] Phone: [phone]  If you don't hear from them about an appointment by early next week give them a call to schedule with one of their doctors - there are several females over there that will help take great care of you.

## 2023-08-18 NOTE — Progress Notes (Signed)
      SUBJECTIVE    Courtney Ellis is a 63 y.o. female here for an annual pelvic exam and pap smear.    Symptoms started Sunday 4/20 - she noticed heaviness feeling in perineum. When she walks around/sits she feels it. Bearing down she feels it. She does have urinary leaking. She has not noticed any rectal symptoms or discomfort. She does feel some left adnexal discomfort. She also has a photo with her today that reveals a large protrusion of pink tissue through vaginal opening.  History of 3 child births. Last was a c/section at 68 yo, other 2 were vaginal deliveries.  No sexual activity for several years.  No vaginal discharge.  Last pap smear in 2020 was abnormal (LSIL, HPV -).   Review of Systems: Current GYN complaints or concerns: see above    Past Medical History:  Diagnosis Date   Anxiety    Depression    Hepatitis    C   Heroin abuse (HCC)    HIV infection (HCC)    Hypertension     Gynecologic History: G3P3003  Patient's last menstrual period was 09/08/2011. Contraception: post menopausal status Last Pap: 01-2019. Results were: abnormal LSIL HPV-   Objective  Physical Exam  Constitutional: Well developed, well nourished, no acute distress. She is alert and oriented x3.  Pelvic: External genitalia is normal in appearance. The vagina is normal in appearance at rest. Valsalva was requested and notable protrusion of pink tissue > 2 cm through vaginal opening. Speculum exam was conducted - The cervix is bulbous and easily visualized. Repeat valsalva displaced cervix posteriorly and downward. No CMT, normal expected cervical mucus present.  Psych: She has a normal mood and affect.      Assessment & Plan:    Problem List Items Addressed This Visit       Unprioritized   Female genital prolapse - Primary   Stage 4, severe pelvic organ prolapse provoked with valsalva. Left adnexal pain ?referred from uterine prolapse.  We discussed this today. I recommended  referral to urogynecology for further evaluation. I suspect she will require surgical correction.  She would likely benefit from pelvic floor PT as well.  Information provided about referral.       Relevant Orders   Ambulatory referral to Urogynecology   Screening for cervical cancer   Cervical brushing collected for cytology and HPV.  Discussed recommended screening interval for women living with HIV disease meant to be lifelong and at an interval of Q1-3 years pending results. Acceptable to space out Q3y with 3 consecutively normal exams. Further recommendations for Corretta Sala Boesch to follow today's results.  Results will be communicated to the patient via telephone call.        Relevant Orders   Cytology - PAP( [place])     Orders Placed This Encounter  Procedures   Ambulatory referral to Urogynecology    Referral Priority:   Routine    Referral Type:   Consultation    Referral Reason:   Specialty Services Required    Requested Specialty:   Urology    Number of Visits Requested:   1    No orders of the defined types were placed in this encounter.   No follow-ups on file.   Gibson Kurtz, MSN, NP-C [facility]  [email or web address] Pager: [phone] Office: [phone] [facility] Main Line: [phone] *Secure Chat Communication Welcome

## 2023-08-20 LAB — CYTOLOGY - PAP
Adequacy: ABSENT
Comment: NEGATIVE
Comment: NEGATIVE
Comment: NEGATIVE
HPV 16: NEGATIVE
HPV 18 / 45: NEGATIVE
High risk HPV: POSITIVE — AB

## 2023-08-21 ENCOUNTER — Telehealth: Payer: Self-pay

## 2023-08-21 NOTE — Telephone Encounter (Signed)
 Spoke with patient regarding results. No questions at this time. Juanita Laster, RMA

## 2023-08-21 NOTE — Telephone Encounter (Signed)
-----   Message from [place] sent at 08/21/2023 11:27 AM EDT ----- Please call Lynnie Saucier - her pap smear returned with some low grade abnormalities that we will need followed up on with gynecology as well.  She can discuss with the uro-gynecology team at her appointment in August if they can help follow up on this there or if she needs a dedicated gynecologist separately.   Nothing to panic about - just something that we need to monitor for with a better test so we can best understand what measures we need to take to prevent any concerns for her in the future.   ~Stephanie

## 2023-08-27 ENCOUNTER — Ambulatory Visit: Admitting: Infectious Diseases

## 2023-09-03 NOTE — Progress Notes (Signed)
 The 10-year ASCVD risk score (Arnett DK, et al., 2019) is: 21.6%   Values used to calculate the score:     Age: 63 years     Sex: Female     Is Non-Hispanic African American: Yes     Diabetic: No     Tobacco smoker: Yes     Systolic Blood Pressure: 142 mmHg     Is BP treated: Yes     HDL Cholesterol: 38 mg/dL     Total Cholesterol: 199 mg/dL  No current statin therapy, next appointment note updated.   Hermelinda Diegel, BSN, RN

## 2023-10-19 ENCOUNTER — Encounter: Payer: Self-pay | Admitting: Internal Medicine

## 2023-10-19 ENCOUNTER — Other Ambulatory Visit: Payer: Self-pay

## 2023-10-19 ENCOUNTER — Ambulatory Visit: Admitting: Internal Medicine

## 2023-10-19 VITALS — BP 166/86 | HR 68 | Temp 97.7°F | Ht 63.0 in | Wt 135.0 lb

## 2023-10-19 DIAGNOSIS — B20 Human immunodeficiency virus [HIV] disease: Secondary | ICD-10-CM | POA: Diagnosis present

## 2023-10-19 DIAGNOSIS — N814 Uterovaginal prolapse, unspecified: Secondary | ICD-10-CM

## 2023-10-19 DIAGNOSIS — Z23 Encounter for immunization: Secondary | ICD-10-CM

## 2023-10-19 MED ORDER — PREZCOBIX 800-150 MG PO TABS: ORAL_TABLET | ORAL | 11 refills | Status: DC

## 2023-10-19 MED ORDER — TRIUMEQ 600-50-300 MG PO TABS: 1.0000 | ORAL_TABLET | Freq: Every day | ORAL | 11 refills | Status: DC

## 2023-10-19 MED ORDER — NICOTINE POLACRILEX 4 MG MT GUM: 4.0000 mg | CHEWING_GUM | OROMUCOSAL | 0 refills | Status: DC | PRN

## 2023-10-19 NOTE — Progress Notes (Unsigned)
 [facility]     HPI: Courtney Ellis is a 63 y.o. female presents for HIV management. Misses about 4 days a month of ART. Deneis etoh, drug use. Smokes cigarretes, sheis interested in nicotine  gum   Past Medical History:  Diagnosis Date   Anxiety    Depression    Hepatitis    C   Heroin abuse (HCC)    HIV infection (HCC)    Hypertension     Past Surgical History:  Procedure Laterality Date   CESAREAN SECTION     DILATION AND CURETTAGE OF UTERUS N/A 06/27/2016   Procedure: Removal [place] Body Vagina;  Surgeon: Jennifer Ozan, DO;  Location: [facility];  Service: Gynecology;  Laterality: N/A;   I & D EXTREMITY Left 09/30/2016   Procedure: IRRIGATION AND DEBRIDEMENT LEFT WRIST AND HAND;  Surgeon: Camella Fallow, MD;  Location: [facility];  Service: Orthopedics;  Laterality: Left;    Family History  Problem Relation Age of Onset   Hypertension Mother    Alcohol abuse Mother    Depression Mother    Diabetes Mother    Drug abuse Mother    Alcohol abuse Father    Cancer Father    Depression Father    Drug abuse Father    Depression Sister    Diabetes Sister    Drug abuse Sister    Mental illness Sister    Depression Brother    Drug abuse Brother    Mental illness Brother    Depression Maternal Aunt    Drug abuse Maternal Aunt    Depression Maternal Uncle    Drug abuse Maternal Uncle    Depression Paternal Aunt    Diabetes Paternal Aunt    Drug abuse Paternal Aunt    Stroke Paternal Aunt    Alcohol abuse Paternal Uncle    Depression Paternal Uncle    Diabetes Paternal Uncle    Drug abuse Paternal Uncle    Kidney disease Paternal Uncle    Current Outpatient Medications on File Prior to Visit  Medication Sig Dispense Refill   abacavir -dolutegravir -lamiVUDine  (TRIUMEQ ) 600-50-300 MG tablet Take 1 tablet by mouth daily. 30 tablet 11   darunavir -cobicistat  (PREZCOBIX ) 800-150 MG tablet TAKE ONE TABLET BY MOUTH DAILY . SWALLOW WHOLE. DO NOT  CRUSH, BREAK, OR CHEW TABLETS. TAKE WITH FOOD. 30 tablet 11   Ensure (ENSURE) Take 237 mLs by mouth 2 (two) times daily between meals. 237 mL 5   SUBOXONE  8-2 MG FILM DISSOLVE ONE FILM UNDER THE TONGUE EVERY 8 HOURS     albuterol  (VENTOLIN  HFA) 108 (90 Base) MCG/ACT inhaler Inhale 1-2 puffs into the lungs every 6 (six) hours as needed for wheezing or shortness of breath. (Patient not taking: Reported on 10/19/2023) 18 g 1   amLODipine  (NORVASC ) 5 MG tablet TAKE ONE TABLET BY MOUTH DAILY (Patient not taking: Reported on 10/19/2023) 30 tablet 0   gabapentin (NEURONTIN) 300 MG capsule Take 300 mg by mouth daily. (Patient not taking: Reported on 10/19/2023)     naproxen  sodium (ALEVE ) 220 MG tablet Take 220 mg by mouth daily as needed. (Patient not taking: Reported on 10/19/2023)     No current facility-administered medications on file prior to visit.    No Known Allergies    Lab Results HIV 1 RNA Quant  Date Value  04/24/2023 4,800 Copies/mL (H)  04/24/2023 CANCELED  05/27/2022 78 Copies/mL (H)   CD4 T Cell Abs (/uL)  Date Value  05/27/2022 247 (L)  10/16/2021 308 (L)  08/21/2020 341 (L)   No results found for: HIV1GENOSEQ Lab Results  Component Value Date   WBC 7.4 08/12/2023   HGB 13.7 08/12/2023   HCT 42.7 08/12/2023   MCV 89.3 08/12/2023   PLT 383 08/12/2023    Lab Results  Component Value Date   CREATININE 0.83 08/12/2023   BUN 13 08/12/2023   NA 136 08/12/2023   K 4.0 08/12/2023   CL 101 08/12/2023   CO2 24 08/12/2023   Lab Results  Component Value Date   ALT 19 08/12/2023   AST 27 08/12/2023   GGT 98 (H) 09/10/2017   ALKPHOS 78 08/12/2023   BILITOT 0.5 08/12/2023    Lab Results  Component Value Date   CHOL 199 04/24/2023   TRIG 160 (H) 04/24/2023   HDL 38 (L) 04/24/2023   LDLCALC 131 (H) 04/24/2023   Lab Results  Component Value Date   HAV NEG 10/25/2009   Lab Results  Component Value Date   HEPBSAG NON-REACTIVE 09/10/2017   HEPBSAB NO 06/15/2006    Lab Results  Component Value Date   HCVAB YES 06/15/2006   Lab Results  Component Value Date   CHLAMYDIAWP Negative 08/21/2020   N Negative 08/21/2020   No results found for: GCPROBEAPT No results found for: QUANTGOLD  Assessment/Plan #HIV -CD4 246 on 05/27/22 , VL4.8k, on 04/24/23 Labs today -continue triumeq  and prezcobic Follow u pin one month  #Smoking cessation -nicorrette gum  #Vaccination COVID Flu 05/27/22 Monkeypox PCV today Meningitis HepA serology today HEpB serlogy today Tdap 05/27/22 Shingles  # Uterine prolapse -Pt has not seen URogyn4   #Health maintenance -Quantiferon today -RPR nr 04/24/23 -HCV vl today -GC NR 04/24/23 -Lipid today -Dysplasia screen F->referre dto gynLSIL on 08/18/23.  -Mammogram  -Colonoscopy    Courtney Satchell Dennise, MD [facility]  I have personally spent 42 minutes involved in face-to-face and non-face-to-face activities for this patient on the day of the visit. Professional time spent includes the following activities: Preparing to see the patient (review of tests), Obtaining and/or reviewing separately obtained history (admission/discharge record), Performing a medically appropriate examination and/or evaluation , Ordering medications/tests/procedures, referring and communicating with other health care professionals, Documenting clinical information in the EMR, Independently interpreting results (not separately reported), Communicating results to the patient/family/caregiver, Counseling and educating the patient/family/caregiver and Care coordination (not separately reported).

## 2023-10-19 NOTE — Patient Instructions (Signed)
 Urogyn 8/15 at [facility]

## 2023-10-20 LAB — T-HELPER CELLS (CD4) COUNT (NOT AT ARMC)
CD4 % Helper T Cell: 20 % — ABNORMAL LOW (ref 33–65)
CD4 T Cell Abs: 412 /uL (ref 400–1790)

## 2023-10-21 ENCOUNTER — Ambulatory Visit: Payer: Medicaid Other | Admitting: Internal Medicine

## 2023-10-22 LAB — COMPLETE METABOLIC PANEL WITHOUT GFR
AG Ratio: 1 (calc) (ref 1.0–2.5)
ALT: 17 U/L (ref 6–29)
AST: 23 U/L (ref 10–35)
Albumin: 3.8 g/dL (ref 3.6–5.1)
Alkaline phosphatase (APISO): 70 U/L (ref 37–153)
BUN/Creatinine Ratio: 17 (calc) (ref 6–22)
BUN: 18 mg/dL (ref 7–25)
CO2: 26 mmol/L (ref 20–32)
Calcium: 9.9 mg/dL (ref 8.6–10.4)
Chloride: 102 mmol/L (ref 98–110)
Creat: 1.07 mg/dL — ABNORMAL HIGH (ref 0.50–1.05)
Globulin: 4 g/dL — ABNORMAL HIGH (ref 1.9–3.7)
Glucose, Bld: 96 mg/dL (ref 65–99)
Potassium: 4.4 mmol/L (ref 3.5–5.3)
Sodium: 137 mmol/L (ref 135–146)
Total Bilirubin: 0.4 mg/dL (ref 0.2–1.2)
Total Protein: 7.8 g/dL (ref 6.1–8.1)

## 2023-10-22 LAB — CBC WITH DIFFERENTIAL/PLATELET
Absolute Lymphocytes: 2058 {cells}/uL (ref 850–3900)
Absolute Monocytes: 451 {cells}/uL (ref 200–950)
Basophils Absolute: 29 {cells}/uL (ref 0–200)
Basophils Relative: 0.6 %
Eosinophils Absolute: 78 {cells}/uL (ref 15–500)
Eosinophils Relative: 1.6 %
HCT: 38.3 % (ref 35.0–45.0)
Hemoglobin: 12.2 g/dL (ref 11.7–15.5)
MCH: 29.5 pg (ref 27.0–33.0)
MCHC: 31.9 g/dL — ABNORMAL LOW (ref 32.0–36.0)
MCV: 92.5 fL (ref 80.0–100.0)
MPV: 10.4 fL (ref 7.5–12.5)
Monocytes Relative: 9.2 %
Neutro Abs: 2283 {cells}/uL (ref 1500–7800)
Neutrophils Relative %: 46.6 %
Platelets: 247 10*3/uL (ref 140–400)
RBC: 4.14 10*6/uL (ref 3.80–5.10)
RDW: 13.1 % (ref 11.0–15.0)
Total Lymphocyte: 42 %
WBC: 4.9 10*3/uL (ref 3.8–10.8)

## 2023-10-22 LAB — LIPID PANEL
Cholesterol: 175 mg/dL (ref <200)
HDL: 41 mg/dL — ABNORMAL LOW (ref >=50)
LDL Cholesterol (Calc): 100 mg/dL — ABNORMAL HIGH
Non-HDL Cholesterol (Calc): 134 mg/dL — ABNORMAL HIGH (ref <130)
Total CHOL/HDL Ratio: 4.3 (calc) (ref <5.0)
Triglycerides: 226 mg/dL — ABNORMAL HIGH (ref <150)

## 2023-10-22 LAB — HEPATITIS B SURFACE ANTIBODY,QUALITATIVE: Hep B S Ab: NONREACTIVE

## 2023-10-22 LAB — QUANTIFERON-TB GOLD PLUS
Mitogen-NIL: 4.36 [IU]/mL
NIL: 2.85 [IU]/mL
QuantiFERON-TB Gold Plus: NEGATIVE
TB1-NIL: 0.3 [IU]/mL
TB2-NIL: 0.34 [IU]/mL

## 2023-10-22 LAB — HEPATITIS A ANTIBODY, TOTAL: Hepatitis A AB,Total: REACTIVE — AB

## 2023-10-22 LAB — HIV RNA, RTPCR W/R GT (RTI, PI,INT)
HIV 1 RNA Quant: 74 {copies}/mL — ABNORMAL HIGH
HIV-1 RNA Quant, Log: 1.87 {Log_copies}/mL — ABNORMAL HIGH

## 2023-10-26 ENCOUNTER — Telehealth: Payer: Self-pay

## 2023-10-26 NOTE — Telephone Encounter (Signed)
 Patient called to discuss results she saw on mychart. Is worried about abnormal values she saw.  Reviewed Viral and CD4.  Will forward message to provider regarding other labs. Patient would like call back. Lorenda CHRISTELLA Code, RMA

## 2023-10-27 ENCOUNTER — Telehealth: Payer: Self-pay | Admitting: Family

## 2023-10-27 NOTE — Telephone Encounter (Signed)
 Spoke with Courtney Ellis regarding her lab work results after confirming two identifiers. Has low level viremia at 74 which appears to be close to a baseline in the last 2 years. Immune to Hepatitis A. Not immune to Hepatitis B and would recommend vaccination. Kidney function, liver function and electrolytes were normal. Triglycerides were elevated. Recommended lifestyle management. Lastly ASCVD risk score of 27.7% and discussed REPRIEVE and recommendation for a statin medication to reduce cardiovascular disease risk and HIV associated inflammation. She will discuss further with Dr. Dennise at her upcoming appointment.   The 10-year ASCVD risk score (Arnett DK, et al., 2019) is: 27.7%   Values used to calculate the score:     Age: 63 years     Clincally relevant sex: Female     Is Non-Hispanic African American: Yes     Diabetic: No     Tobacco smoker: Yes     Systolic Blood Pressure: 166 mmHg     Is BP treated: Yes     HDL Cholesterol: 41 mg/dL     Total Cholesterol: 175 mg/dL   [name], NP 05/22/7972 9:16 AM

## 2023-11-17 ENCOUNTER — Telehealth: Payer: Self-pay | Admitting: Internal Medicine

## 2023-11-17 NOTE — Telephone Encounter (Signed)
 Courtney Ellis called to ask why she needs to follow-up with Dr. Dennise. She states she was just seen a month ago and her specialist believes the problem is with her bladder. She also states since a pap was never completed she wonders what Dr. Dennise will review at the appt. Pt is scheduled 7/30 at 11am. She will follow-up tomorrow.

## 2023-11-18 ENCOUNTER — Ambulatory Visit: Admitting: Internal Medicine

## 2023-11-18 NOTE — Progress Notes (Deleted)
 [facility]     HPI:  Courtney Ellis is a 63 y.o. female presents for HIV management. Misses about 4 days a month of ART. Deneis etoh, drug use. 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  Anal sex receptive***, insertive***. Contraception**** Oral sex, contraception*** Vaginal penile sex, contraception***  Social: Occupation: Housing: Support: Understanding of HIV: Etoh/drug/tobacco use:  Past Medical History:  Diagnosis Date   Anxiety    Depression    Hepatitis    C   Heroin abuse (HCC)    HIV infection (HCC)    Hypertension     Past Surgical History:  Procedure Laterality Date   CESAREAN SECTION     DILATION AND CURETTAGE OF UTERUS N/A 06/27/2016   Procedure: Removal Deland Body Vagina;  Surgeon: Jennifer Ozan, DO;  Location: [facility];  Service: Gynecology;  Laterality: N/A;   I & D EXTREMITY Left 09/30/2016   Procedure: IRRIGATION AND DEBRIDEMENT LEFT WRIST AND HAND;  Surgeon: Camella Fallow, MD;  Location: [facility];  Service: Orthopedics;  Laterality: Left;    Family History  Problem Relation Age of Onset   Hypertension Mother    Alcohol abuse Mother    Depression Mother    Diabetes Mother    Drug abuse Mother    Alcohol abuse Father    Cancer Father    Depression Father    Drug abuse Father    Depression Sister    Diabetes Sister    Drug abuse Sister    Mental illness Sister    Depression Brother    Drug abuse Brother    Mental illness Brother    Depression Maternal Aunt    Drug abuse Maternal Aunt    Depression Maternal Uncle    Drug abuse Maternal Uncle    Depression Paternal Aunt    Diabetes Paternal Aunt    Drug abuse Paternal Aunt    Stroke Paternal Aunt    Alcohol abuse Paternal Uncle    Depression Paternal Uncle    Diabetes Paternal Uncle    Drug abuse  Paternal Uncle    Kidney disease Paternal Uncle    Current Outpatient Medications on File Prior to Visit  Medication Sig Dispense Refill   abacavir -dolutegravir -lamiVUDine  (TRIUMEQ ) 600-50-300 MG tablet Take 1 tablet by mouth daily. 30 tablet 11   albuterol  (VENTOLIN  HFA) 108 (90 Base) MCG/ACT inhaler Inhale 1-2 puffs into the lungs every 6 (six) hours as needed for wheezing or shortness of breath. (Patient not taking: Reported on 10/19/2023) 18 g 1   amLODipine  (NORVASC ) 5 MG tablet TAKE ONE TABLET BY MOUTH DAILY (Patient not taking: Reported on 10/19/2023) 30 tablet 0   darunavir -cobicistat  (PREZCOBIX ) 800-150 MG tablet TAKE ONE TABLET BY MOUTH DAILY . SWALLOW WHOLE. DO NOT CRUSH, BREAK, OR CHEW TABLETS. TAKE WITH FOOD. 30 tablet 11   Ensure (ENSURE) Take 237 mLs by mouth 2 (two) times daily between meals. 237 mL 5   gabapentin (NEURONTIN) 300 MG capsule Take 300 mg by mouth daily. (Patient not taking: Reported on 10/19/2023)     naproxen  sodium (ALEVE ) 220 MG tablet Take 220 mg by mouth daily as needed. (Patient not taking: Reported on 10/19/2023)     nicotine  polacrilex (NICORETTE ) 4 MG gum Take 1 each (4 mg total) by mouth as needed for smoking  cessation. 100 tablet 0   SUBOXONE  8-2 MG FILM DISSOLVE ONE FILM UNDER THE TONGUE EVERY 8 HOURS     No current facility-administered medications on file prior to visit.    No Known Allergies    Lab Results HIV 1 RNA Quant  Date Value  10/19/2023 74 copies/mL (H)  04/24/2023 4,800 Copies/mL (H)  04/24/2023 CANCELED   CD4 T Cell Abs (/uL)  Date Value  10/19/2023 412  05/27/2022 247 (L)  10/16/2021 308 (L)   No results found for: HIV1GENOSEQ Lab Results  Component Value Date   WBC 4.9 10/19/2023   HGB 12.2 10/19/2023   HCT 38.3 10/19/2023   MCV 92.5 10/19/2023   PLT 247 10/19/2023    Lab Results  Component Value Date   CREATININE 1.07 (H) 10/19/2023   BUN 18 10/19/2023   NA 137 10/19/2023   K 4.4 10/19/2023   CL 102 10/19/2023    CO2 26 10/19/2023   Lab Results  Component Value Date   ALT 17 10/19/2023   AST 23 10/19/2023   GGT 98 (H) 09/10/2017   ALKPHOS 78 08/12/2023   BILITOT 0.4 10/19/2023    Lab Results  Component Value Date   CHOL 175 10/19/2023   TRIG 226 (H) 10/19/2023   HDL 41 (L) 10/19/2023   LDLCALC 100 (H) 10/19/2023   Lab Results  Component Value Date   HAV REACTIVE (A) 10/19/2023   Lab Results  Component Value Date   HEPBSAG NON-REACTIVE 09/10/2017   HEPBSAB NON-REACTIVE 10/19/2023   Lab Results  Component Value Date   HCVAB YES 06/15/2006   Lab Results  Component Value Date   CHLAMYDIAWP Negative 08/21/2020   N Negative 08/21/2020   No results found for: GCPROBEAPT No results found for: QUANTGOLD  Assessment/Plan #HIV -CD4 412 on 05/27/22 , VL474, on6/30//25 Labs today, start statin -continue triumeq  and prezcobic Follow u pin one month   #Smoking cessation -nicorrette gum   #Vaccination COVID Flu 05/27/22 Monkeypox PCV today Meningitis HepA immune HEpB needs Tdap 05/27/22 Shingles   # Uterine prolapse -Pt has not seen URogyn4     #Health maintenance -Quantiferon negative -RPR nr 04/24/23 -HCV vl today -GC NR 04/24/23 -Lipid The 10-year ASCVD risk score (Arnett DK, et al., 2019) is: 27.7%   Values used to calculate the score:     Age: 72 years     Clincally relevant sex: Female     Is Non-Hispanic African American: Yes     Diabetic: No     Tobacco smoker: Yes     Systolic Blood Pressure: 166 mmHg     Is BP treated: Yes     HDL Cholesterol: 41 mg/dL     Total Cholesterol: 175 mg/dL  -Dysplasia screen F->referre dto gynLSIL on 08/18/23.  -Mammogram  -Colonoscopy  Loney Stank, MD [facility]

## 2023-12-04 ENCOUNTER — Ambulatory Visit (INDEPENDENT_AMBULATORY_CARE_PROVIDER_SITE_OTHER): Admitting: Obstetrics and Gynecology

## 2023-12-04 ENCOUNTER — Encounter: Payer: Self-pay | Admitting: Obstetrics and Gynecology

## 2023-12-04 VITALS — BP 155/87 | HR 74 | Wt 116.6 lb

## 2023-12-04 DIAGNOSIS — R350 Frequency of micturition: Secondary | ICD-10-CM

## 2023-12-04 DIAGNOSIS — R87612 Low grade squamous intraepithelial lesion on cytologic smear of cervix (LGSIL): Secondary | ICD-10-CM | POA: Diagnosis not present

## 2023-12-04 DIAGNOSIS — M25552 Pain in left hip: Secondary | ICD-10-CM

## 2023-12-04 DIAGNOSIS — N8110 Cystocele, unspecified: Secondary | ICD-10-CM | POA: Diagnosis not present

## 2023-12-04 LAB — POCT URINALYSIS DIP (CLINITEK)
Bilirubin, UA: NEGATIVE
Blood, UA: NEGATIVE
Glucose, UA: NEGATIVE mg/dL
Ketones, POC UA: NEGATIVE mg/dL
Leukocytes, UA: NEGATIVE
Nitrite, UA: NEGATIVE
POC PROTEIN,UA: 100 — AB
Spec Grav, UA: 1.025 (ref 1.010–1.025)
Urobilinogen, UA: 1 U/dL
pH, UA: 5.5 (ref 5.0–8.0)

## 2023-12-04 NOTE — Assessment & Plan Note (Signed)
-  Stage II anterior, Stage I posterior, Stage 0 apical prolapse  - For treatment of pelvic organ prolapse, we discussed options for management including expectant management, conservative management, and surgical management, such as Kegels, a pessary, pelvic floor physical therapy, and specific surgical procedures (anterior repair). - Needs workup of cervical dysplasia but handout provided regarding surgery. She is not sure she is interested right now.  - She is considering a pessary.

## 2023-12-04 NOTE — Assessment & Plan Note (Signed)
-   Will likely need colposcopy. Will refer to GYN for management.  - We discussed deferment of any surgical treatment for prolapse until she has completed workup for cervical dysplasia.

## 2023-12-04 NOTE — Progress Notes (Signed)
 New Patient Evaluation and Consultation  Referring Provider: Melvenia Corean SAILOR, NP PCP: No primary care provider on file. Date of Service: 12/04/2023  SUBJECTIVE Chief Complaint: New Patient (Initial Visit) (Prolapse - noticed it in May with lower level pain)  History of Present Illness: Courtney Ellis is a 63 y.o. Black or African-American female seen in consultation at the request of NP Corean Melvenia for evaluation of prolapse.    Urinary Symptoms: Leaks urine with going from sitting to standing (urgency) Leaks a few times a week Pad use: 2 liners/ mini-pads per day.   Patient is bothered by UI symptoms.  Day time voids 5.  Nocturia: 2-3 times per night to void. Voiding dysfunction:  does not empty bladder well.  Patient does not use a catheter to empty bladder.  When urinating, patient feels difficulty starting urine stream. Has to splint to urinate sometime. Drinks: 3 bottles water per day  UTIs: 0 UTI's in the last year.   Denies history of blood in urine and kidney or bladder stones   Pelvic Organ Prolapse Symptoms:                  Patient Admits to a feeling of a bulge the vaginal area. It has been present for 4 months.  Patient Admits to seeing a bulge.  This bulge is bothersome.  Bowel Symptom: Bowel movements: every other day Stool consistency: soft  Straining: no.  Splinting: yes, sometimes Incomplete evacuation: no.  Patient Denies accidental bowel leakage / fecal incontinence Bowel regimen: none   Sexual Function Sexually active: no.   Pelvic Pain Admits to pelvic pain Location: left hip/ groin, shooting pain down her thigh Pain occurs: regularly, throughout the day and evening - cannot lay on left side, had difficulty lifting her leg Prior pain treatment: none Improved by: nothing Worsened by: walking   Past Medical History:  Past Medical History:  Diagnosis Date   Anxiety    Depression    Hepatitis    C- treated   Heroin abuse (HCC)     HIV infection (HCC)    Hypertension      Past Surgical History:   Past Surgical History:  Procedure Laterality Date   CESAREAN SECTION     DILATION AND CURETTAGE OF UTERUS N/A 06/27/2016   Procedure: Removal Deland Body Vagina;  Surgeon: Jennifer Ozan, DO;  Location: [facility];  Service: Gynecology;  Laterality: N/A;   I & D EXTREMITY Left 09/30/2016   Procedure: IRRIGATION AND DEBRIDEMENT LEFT WRIST AND HAND;  Surgeon: Camella Fallow, MD;  Location: [facility];  Service: Orthopedics;  Laterality: Left;     Past OB/GYN History: OB History  Gravida Para Term Preterm AB Living  3 3 3   3   SAB IAB Ectopic Multiple Live Births          # Outcome Date GA Lbr Len/2nd Weight Sex Type Anes PTL Lv  3 Term           2 Term           1 Term             Obstetric Comments  5lbs-6.5lbs     Vaginal deliveries: 2,  Forceps/ Vacuum deliveries: 0, Cesarean section: 1 Menopausal: Denies vaginal bleeding since menopause      Component Value Date/Time   DIAGPAP - Low grade squamous intraepithelial lesion (LSIL) (A) 08/18/2023 1520   DIAGPAP - Low grade squamous intraepithelial lesion (LSIL) (A) 02/01/2019 1552  DIAGPAP (A) 06/27/2016 0000    ATYPICAL SQUAMOUS CELLS OF UNDETERMINED SIGNIFICANCE (ASC-US ).   HPVHIGH Positive (A) 08/18/2023 1520   HPVHIGH Negative 02/01/2019 1552   ADEQPAP  08/18/2023 1520    Satisfactory for evaluation; transformation zone component ABSENT.   ADEQPAP  02/01/2019 1552    Satisfactory for evaluation; transformation zone component PRESENT.   ADEQPAP (A) 06/27/2016 0000    Satisfactory for evaluation  endocervical/transformation zone component PRESENT.    Medications: Patient has a current medication list which includes the following prescription(s): triumeq , prezcobix , ensure, fluoxetine, naproxen  sodium, nicotine  polacrilex, olanzapine, and suboxone .   Allergies: Patient has no known allergies.   Social History:  Social History   Tobacco Use   Smoking  status: Some Days    Types: Cigarettes    Passive exposure: Past   Smokeless tobacco: Never   Tobacco comments:    3-4 CIG  DAILY   Substance Use Topics   Alcohol use: No    Alcohol/week: 0.0 standard drinks of alcohol   Drug use: Not Currently    Types: IV, Heroin    Comment: No I    Relationship status: widowed Patient is not employed. Regular exercise: No History of abuse: No  Family History:   Family History  Problem Relation Age of Onset   Hypertension Mother    Alcohol abuse Mother    Depression Mother    Diabetes Mother    Drug abuse Mother    Alcohol abuse Father    Cancer Father    Depression Father    Drug abuse Father    Depression Sister    Diabetes Sister    Drug abuse Sister    Mental illness Sister    Depression Brother    Drug abuse Brother    Mental illness Brother    Depression Maternal Aunt    Drug abuse Maternal Aunt    Depression Maternal Uncle    Drug abuse Maternal Uncle    Depression Paternal Aunt    Diabetes Paternal Aunt    Drug abuse Paternal Aunt    Stroke Paternal Aunt    Alcohol abuse Paternal Uncle    Depression Paternal Uncle    Diabetes Paternal Uncle    Drug abuse Paternal Uncle    Kidney disease Paternal Uncle      Review of Systems: Review of Systems  Constitutional:  Positive for weight loss. Negative for fever and malaise/fatigue.  Respiratory:  Positive for cough. Negative for shortness of breath and wheezing.   Cardiovascular:  Negative for chest pain, palpitations and leg swelling.  Gastrointestinal:  Negative for abdominal pain and blood in stool.  Genitourinary:  Negative for dysuria.  Musculoskeletal:  Negative for myalgias.  Skin:  Negative for rash.  Neurological:  Negative for dizziness and headaches.  Endo/Heme/Allergies:  Does not bruise/bleed easily.  Psychiatric/Behavioral:  Negative for depression. The patient is not nervous/anxious.      OBJECTIVE Physical Exam: Vitals:   12/04/23 0919  BP:  (!) 155/87  Pulse: 74  Weight: 116 lb 9.6 oz (52.9 kg)    Physical Exam Vitals reviewed. Exam conducted with a chaperone present.  Constitutional:      General: She is not in acute distress. Pulmonary:     Effort: Pulmonary effort is normal.  Abdominal:     General: There is no distension.     Palpations: Abdomen is soft.     Tenderness: There is no abdominal tenderness. There is no rebound.  Musculoskeletal:  General: Tenderness present. No swelling.     Left hip: Decreased range of motion.       Legs:     Comments: Pain in left anterior thigh and groin  Skin:    General: Skin is warm and dry.     Findings: No rash.  Neurological:     Mental Status: She is alert and oriented to person, place, and time.  Psychiatric:        Mood and Affect: Mood normal.        Behavior: Behavior normal.     GU / Detailed Urogynecologic Evaluation:  Pelvic Exam: Normal external female genitalia; Bartholin's and Skene's glands normal in appearance; urethral meatus normal in appearance, no urethral masses or discharge.   CST: negative  Speculum exam reveals normal vaginal mucosa without atrophy. Cervix normal appearance. Uterus normal single, nontender. Adnexa no mass, fullness, tenderness.    Pelvic floor strength I/V  Pelvic floor musculature: Right levator non-tender, Right obturator non-tender, Left levator non-tender, Left obturator non-tender  POP-Q:   POP-Q  0                                            Aa   0                                           Ba  -7.5                                              C   3                                            Gh  4                                            Pb  8                                            tvl   -2                                            Ap  -2                                            Bp  -7                                              D  Rectal Exam:  deferred  Post-Void Residual  (PVR) by Bladder Scan: In order to evaluate bladder emptying, we discussed obtaining a postvoid residual and patient agreed to this procedure.  Procedure: The ultrasound unit was placed on the patient's abdomen in the suprapubic region after the patient had voided.    Post Void Residual - 12/04/23 1055       Post Void Residual   Post Void Residual 5 mL           Laboratory Results: Lab Results  Component Value Date   COLORU yellow 12/04/2023   CLARITYU clear 12/04/2023   GLUCOSEUR negative 12/04/2023   BILIRUBINUR negative 12/04/2023   SPECGRAV 1.025 12/04/2023   RBCUR negative 12/04/2023   PHUR 5.5 12/04/2023   PROTEINUR 100 (A) 08/12/2023   UROBILINOGEN 1.0 12/04/2023   LEUKOCYTESUR Negative 12/04/2023    Lab Results  Component Value Date   CREATININE 1.07 (H) 10/19/2023   CREATININE 0.83 08/12/2023   CREATININE 0.79 05/27/2022    No results found for: HGBA1C  Lab Results  Component Value Date   HGB 12.2 10/19/2023     ASSESSMENT AND PLAN Ms. Nofziger is a 63 y.o. with:  1. Prolapse of anterior vaginal wall   2. Low grade squamous intraepithelial lesion on cytologic smear of cervix (LGSIL)   3. Pain of left hip   4. Frequency of urination     Prolapse of anterior vaginal wall Assessment & Plan: -Stage II anterior, Stage I posterior, Stage 0 apical prolapse  - For treatment of pelvic organ prolapse, we discussed options for management including expectant management, conservative management, and surgical management, such as Kegels, a pessary, pelvic floor physical therapy, and specific surgical procedures (anterior repair). - Needs workup of cervical dysplasia but handout provided regarding surgery. She is not sure she is interested right now.  - She is considering a pessary.    Low grade squamous intraepithelial lesion on cytologic smear of cervix (LGSIL) Assessment & Plan: - Will likely need colposcopy. Will refer to GYN for management.  - We  discussed deferment of any surgical treatment for prolapse until she has completed workup for cervical dysplasia.   Orders: -     Ambulatory referral to Gynecology  Pain of left hip -     Ambulatory referral to Orthopedic Surgery  Frequency of urination -     POCT URINALYSIS DIP (CLINITEK)  Return as needed   Rosaline LOISE Caper, MD

## 2023-12-09 ENCOUNTER — Ambulatory Visit: Admitting: Internal Medicine

## 2023-12-11 ENCOUNTER — Telehealth: Payer: Self-pay | Admitting: Obstetrics and Gynecology

## 2023-12-11 NOTE — Telephone Encounter (Signed)
 Called and spoke to patient about her concerns. We discussed the lesion on the cervix, which she reported to be unaware of. We discussed she was referred to the orthopedic provider as her hip pain did not seem to stem from pelvic floor disorder. She states she is concerned about the prolapse. We discussed she is still eligible for surgery to fix the prolapse, but we need to make sure the cervical lesion is addressed before any surgery occurs. For the support of prolapse we discussed I could fit her for a pessary so she would be more comfortable while awaiting to have things addressed. She is open to pessary trial.

## 2023-12-24 ENCOUNTER — Encounter: Payer: Self-pay | Admitting: Obstetrics and Gynecology

## 2023-12-24 ENCOUNTER — Ambulatory Visit (INDEPENDENT_AMBULATORY_CARE_PROVIDER_SITE_OTHER): Admitting: Obstetrics and Gynecology

## 2023-12-24 VITALS — BP 154/96 | HR 70

## 2023-12-24 DIAGNOSIS — N812 Incomplete uterovaginal prolapse: Secondary | ICD-10-CM

## 2023-12-24 DIAGNOSIS — N8110 Cystocele, unspecified: Secondary | ICD-10-CM

## 2023-12-24 NOTE — Patient Instructions (Signed)
 For the pessary please use lubrication with insertion.   Take the pessary out once a week and leave it out overnight to let the tissues rest, then re-insert the next morning.   Wash the pessary with warm water and mild soap (like dove soap)  Please call if any issues.

## 2023-12-24 NOTE — Progress Notes (Signed)
 [name]   Subjective:     Chief Complaint: Pessary fitting Courtney Ellis is a 63 y.o. female is here for pessary fitting.)  History of Present Illness: Courtney Ellis is a 63 y.o. female with stage II pelvic organ prolapse who presents today for a pessary fitting.    Past Medical History: Patient  has a past medical history of Anxiety, Depression, Hepatitis, Heroin abuse (HCC), HIV infection (HCC), and Hypertension.   Past Surgical History: She  has a past surgical history that includes Cesarean section; Dilation and curettage of uterus (N/A, 06/27/2016); and I & D extremity (Left, 09/30/2016).   Medications: She has a current medication list which includes the following prescription(s): triumeq , prezcobix , ensure, fluoxetine, naproxen  sodium, nicotine  polacrilex, olanzapine, and suboxone .   Allergies: Patient has no known allergies.   Social History: Patient  reports that she has been smoking cigarettes. She has been exposed to tobacco smoke. She has never used smokeless tobacco. She reports that she does not currently use drugs after having used the following drugs: IV and Heroin. She reports that she does not drink alcohol.      Objective:    BP (!) 154/96   Pulse 70   LMP 09/08/2011  Gen: No apparent distress, A&O x 3. Pelvic Exam: Normal external female genitalia; Bartholin's and Skene's glands normal in appearance; urethral meatus normal in appearance, no urethral masses or discharge.   A size #4 ring with support pessary (Lot [id number]) was fitted. It was comfortable, stayed in place with valsalva and was an appropriate size on examination, with one finger fitting between the pessary and the vaginal walls. Patient was able to squat, cough, and attempt urination without pessary expulsion. Patient demonstrated proper removal and replacement.    Assessment/Plan:    Assessment: Courtney Ellis is a 63 y.o. with stage II pelvic organ prolapse who presents for a  pessary fitting. Plan: She was fitted with a #4 ring with support pessary. She will remove once a week. She will use lubricant.   Follow-up in 2 months for a pessary check or sooner as needed.  All questions were answered.    Gleason Ardoin G Raffaela Ladley, NP

## 2024-01-11 ENCOUNTER — Ambulatory Visit: Admitting: Internal Medicine

## 2024-01-15 ENCOUNTER — Ambulatory Visit (HOSPITAL_BASED_OUTPATIENT_CLINIC_OR_DEPARTMENT_OTHER): Admitting: Orthopaedic Surgery

## 2024-01-15 ENCOUNTER — Ambulatory Visit (HOSPITAL_BASED_OUTPATIENT_CLINIC_OR_DEPARTMENT_OTHER)

## 2024-01-15 DIAGNOSIS — M25552 Pain in left hip: Secondary | ICD-10-CM | POA: Diagnosis not present

## 2024-01-15 NOTE — Progress Notes (Signed)
 Chief Complaint: Left hip pain     History of Present Illness:    Courtney Ellis is a 63 y.o. female presents today with ongoing left hip pain in the groin area for the last several years.  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  She experiences pain in a C-shaped distribution.  She is having a hard time laying directly on the side.  This does bother her as work and is limiting her ability to work as well.    PMH/PSH/Family History/Social History/Meds/Allergies:    Past Medical History:  Diagnosis Date   Anxiety    Depression    Hepatitis    C- treated   Heroin abuse (HCC)    HIV infection (HCC)    Hypertension    Past Surgical History:  Procedure Laterality Date   CESAREAN SECTION     DILATION AND CURETTAGE OF UTERUS N/A 06/27/2016   Procedure: Removal Deland Body Vagina;  Surgeon: Jennifer Ozan, DO;  Location: [facility];  Service: Gynecology;  Laterality: N/A;   I & D EXTREMITY Left 09/30/2016   Procedure: IRRIGATION AND DEBRIDEMENT LEFT WRIST AND HAND;  Surgeon: Camella Fallow, MD;  Location: [facility];  Service: Orthopedics;  Laterality: Left;   Social History   Socioeconomic History   Marital status: Widowed    Spouse name: Not on file   Number of children: Not on file   Years of education: Not on file   Highest education level: Not on file  Occupational History   Not on file  Tobacco Use   Smoking status: Some Days    Types: Cigarettes    Passive exposure: Past   Smokeless tobacco: Never   Tobacco comments:    3-4 CIG  DAILY   Substance and Sexual Activity   Alcohol use: No    Alcohol/week: 0.0 standard drinks of alcohol   Drug use: Not Currently    Types: IV, Heroin    Comment: No I   Sexual activity: Not Currently    Partners: Male    Comment: DECLINES CONDOMS 05/27/22  Other Topics Concern   Not on file  Social History Narrative   Not on file    Social Drivers of Health   Financial Resource Strain: Not on file  Food Insecurity: Not on file  Transportation Needs: Unmet Transportation Needs (09/04/2022)   PRAPARE - Administrator, Civil Service (Medical): Yes    Lack of Transportation (Non-Medical): Yes  Physical Activity: Not on file  Stress: Not on file  Social Connections: Not on file   Family History  Problem Relation Age of Onset   Hypertension Mother    Alcohol abuse Mother    Depression Mother    Diabetes Mother    Drug abuse Mother    Alcohol abuse Father    Cancer Father    Depression Father    Drug abuse Father    Depression Sister    Diabetes Sister    Drug abuse Sister    Mental illness Sister    Depression Brother    Drug abuse Brother    Mental illness Brother    Depression Maternal Aunt    Drug abuse Maternal Aunt    Depression Maternal Uncle    Drug abuse Maternal Uncle    Depression Paternal Aunt    Diabetes Paternal Aunt    Drug  abuse Paternal Aunt    Stroke Paternal Aunt    Alcohol abuse Paternal Uncle    Depression Paternal Uncle    Diabetes Paternal Uncle    Drug abuse Paternal Uncle    Kidney disease Paternal Uncle    No Known Allergies Current Outpatient Medications  Medication Sig Dispense Refill   abacavir -dolutegravir -lamiVUDine  (TRIUMEQ ) 600-50-300 MG tablet Take 1 tablet by mouth daily. 30 tablet 11   darunavir -cobicistat  (PREZCOBIX ) 800-150 MG tablet TAKE ONE TABLET BY MOUTH DAILY . SWALLOW WHOLE. DO NOT CRUSH, BREAK, OR CHEW TABLETS. TAKE WITH FOOD. 30 tablet 11   Ensure (ENSURE) Take 237 mLs by mouth 2 (two) times daily between meals. 237 mL 5   FLUoxetine (PROZAC) 20 MG capsule Take 20 mg by mouth daily.     naproxen  sodium (ALEVE ) 220 MG tablet Take 220 mg by mouth daily as needed.     nicotine  polacrilex (NICORETTE ) 4 MG gum Take 1 each (4 mg total) by mouth as needed for smoking cessation. 100 tablet 0   OLANZapine (ZYPREXA) 2.5 MG tablet SMARTSIG:1 Tablet(s)  By Mouth Every Evening     SUBOXONE  8-2 MG FILM DISSOLVE ONE FILM UNDER THE TONGUE EVERY 8 HOURS     No current facility-administered medications for this visit.   No results found.  Review of Systems:   A ROS was performed including pertinent positives and negatives as documented in the HPI.  Physical Exam :   Constitutional: NAD and appears stated age Neurological: Alert and oriented Psych: Appropriate affect and cooperative Last menstrual period 09/08/2011.   Comprehensive Musculoskeletal Exam:    Left hip with tenderness about the femoral acetabular joint with positive FADIR maneuver.  30 degrees internal rotation causes pain.  Walks with mildly antalgic gait   Imaging:   Xray (4 views left hip): Severe left hip osteoarthritis    I personally reviewed and interpreted the radiographs.   Assessment and Plan:   63 y.o. female with evidence of quite significant left hip osteoarthritis.  I did discuss treatment options at today's visit.  We did discuss the possibility of physical therapy although I do believe this would likely just aggravate her pain as she does have severe osteoarthritis.  I did offer her an injection which she has declined that she is not looking for a more temporary measure but I did discuss I do believe this is reasonable given the fact that she does have essentially end-stage osteoarthritis of the left hip to consider more of a surgical option.  This time she would like to meet with a hip arthroplasty surgeon we will plan for referral to Dr. Vernetta for this  -Plan for referral to Dr. Vernetta for discussion of left total hip arthroplasty   I personally saw and evaluated the patient, and participated in the management and treatment plan.  Elspeth Parker, MD Attending Physician, Orthopedic Surgery  This document was dictated using Dragon voice recognition software. A reasonable attempt at proof reading has been made to minimize errors.

## 2024-01-19 ENCOUNTER — Telehealth (HOSPITAL_BASED_OUTPATIENT_CLINIC_OR_DEPARTMENT_OTHER): Payer: Self-pay | Admitting: Orthopaedic Surgery

## 2024-01-19 NOTE — Telephone Encounter (Signed)
 Patient wants to know if she can get pain meds unitl she sees Dr Vernetta at [facility] of [facility]

## 2024-01-20 ENCOUNTER — Other Ambulatory Visit: Payer: Self-pay | Admitting: Orthopaedic Surgery

## 2024-01-20 MED ORDER — TRAMADOL HCL 50 MG PO TABS: 50.0000 mg | ORAL_TABLET | Freq: Four times a day (QID) | ORAL | 1 refills | Status: DC | PRN

## 2024-01-25 ENCOUNTER — Telehealth (HOSPITAL_BASED_OUTPATIENT_CLINIC_OR_DEPARTMENT_OTHER): Payer: Self-pay | Admitting: Orthopaedic Surgery

## 2024-01-25 NOTE — Telephone Encounter (Signed)
 I called and advised pt. Can she be worked in sooner with Dr Vernetta?

## 2024-01-25 NOTE — Telephone Encounter (Signed)
 Patient states that the meds that were sent in for her are not working could something else be sent in for her.

## 2024-01-26 NOTE — Telephone Encounter (Signed)
 Called patient and she is aware I moved appt up

## 2024-02-08 ENCOUNTER — Telehealth: Payer: Self-pay | Admitting: Infectious Diseases

## 2024-02-08 ENCOUNTER — Ambulatory Visit: Admitting: Infectious Diseases

## 2024-02-08 NOTE — Telephone Encounter (Signed)
 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  Will try to reach her to have her come in to consolidate to either Symtuza once daily or Biktarvy once daily.    Corean Fireman, MSN, NP-C [facility]  [email or web address] Pager: [phone] Office: [phone] [facility] Main Line: [phone] *Secure Chat Communication Welcome

## 2024-02-08 NOTE — Progress Notes (Signed)
 NEW REFERRAL TO CPP CLINIC      HPI: Courtney Ellis is a 63 y.o. female who presents to the [facility] pharmacy clinic for HIV follow-up.  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   Routine screening for STI (sexually transmitted infection) 02/07/2019   Encounter for long-term (current) use of high-risk medication 02/07/2019   Tobacco abuse 02/07/2019   Screening for cervical cancer 02/02/2019   Left hip pain 02/02/2019   Insomnia 01/12/2018   Panic anxiety syndrome 01/12/2018   Liver fibrosis 10/06/2017   Encounter for screening mammogram for malignant neoplasm of breast 04/27/2017   Congestion of both ears 03/26/2017   Heroin abuse (HCC) 10/04/2016   Essential hypertension 08/03/2007   Chronic hepatitis C without hepatic coma (HCC) 11/24/2006   Major depressive disorder, recurrent episode 07/28/2006   Human immunodeficiency virus (HIV) disease (HCC) 05/15/2006    Patient's Medications  New Prescriptions   No medications on file  Previous Medications   ABACAVIR -DOLUTEGRAVIR -LAMIVUDINE  (TRIUMEQ ) 600-50-300 MG TABLET    Take 1 tablet by mouth daily.   DARUNAVIR -COBICISTAT  (PREZCOBIX ) 800-150 MG TABLET    TAKE ONE TABLET BY MOUTH DAILY . SWALLOW WHOLE. DO NOT CRUSH, BREAK, OR CHEW TABLETS. TAKE WITH FOOD.   ENSURE (ENSURE)    Take 237 mLs by mouth 2 (two) times daily between meals.   FLUOXETINE (PROZAC) 20 MG CAPSULE    Take 20 mg by mouth daily.   NAPROXEN  SODIUM (ALEVE ) 220 MG TABLET    Take 220 mg by mouth daily as needed.   NICOTINE  POLACRILEX (NICORETTE ) 4 MG GUM    Take 1 each (4 mg total) by mouth as needed for smoking cessation.   OLANZAPINE (ZYPREXA) 2.5 MG TABLET     SMARTSIG:1 Tablet(s) By Mouth Every Evening   SUBOXONE  8-2 MG FILM    DISSOLVE ONE FILM UNDER THE TONGUE EVERY 8 HOURS   TRAMADOL  (ULTRAM ) 50 MG TABLET    Take 1 tablet (50 mg total) by mouth every 6 (six) hours as needed.  Modified Medications   No medications on file  Discontinued Medications   No medications on file    Allergies: No Known Allergies  Past Medical History: Past Medical History:  Diagnosis Date   Anxiety    Depression    Hepatitis    C- treated   Heroin abuse (HCC)    HIV infection (HCC)    Hypertension     Social History: Social History   Socioeconomic History   Marital status: Widowed    Spouse name: Not on file   Number of children: Not on file   Years of education: Not on file   Highest education level: Not on file  Occupational History   Not on file  Tobacco Use   Smoking status: Some Days    Types: Cigarettes    Passive exposure: Past   Smokeless tobacco: Never   Tobacco comments:    3-4 CIG  DAILY   Substance and Sexual Activity   Alcohol use: No    Alcohol/week: 0.0 standard drinks of alcohol   Drug use: Not Currently    Types: IV, Heroin    Comment: No I   Sexual activity: Not Currently  Partners: Male    Comment: DECLINES CONDOMS 05/27/22  Other Topics Concern   Not on file  Social History Narrative   Not on file   Social Drivers of Health   Financial Resource Strain: Not on file  Food Insecurity: Not on file  Transportation Needs: Unmet Transportation Needs (09/04/2022)   PRAPARE - Administrator, Civil Service (Medical): Yes    Lack of Transportation (Non-Medical): Yes  Physical Activity: Not on file  Stress: Not on file  Social Connections: Not on file    Labs: Lab Results  Component Value Date   HIV1RNAQUANT 74 (H) 10/19/2023   HIV1RNAQUANT 4,800 (H) 04/24/2023   HIV1RNAQUANT CANCELED 04/24/2023   CD4TABS 412 10/19/2023   CD4TABS 247 (L) 05/27/2022   CD4TABS 308 (L) 10/16/2021    RPR and STI Lab  Results  Component Value Date   LABRPR NON-REACTIVE 04/24/2023   LABRPR NON-REACTIVE 05/27/2022   LABRPR NON-REACTIVE 10/16/2021   LABRPR NON-REACTIVE 08/21/2020   LABRPR NON-REACTIVE 08/23/2019    STI Results GC CT  08/21/2020  3:52 PM Negative  Negative   02/01/2019  3:52 PM Negative    Negative  Negative    Negative   04/16/2017 12:00 AM Negative  Negative   01/02/2015 12:00 AM Negative  Negative     Hepatitis B Lab Results  Component Value Date   HEPBSAB NON-REACTIVE 10/19/2023   HEPBSAG NON-REACTIVE 09/10/2017   Hepatitis C Lab Results  Component Value Date   HCVRNAPCRQN <15 08/21/2020   Hepatitis A Lab Results  Component Value Date   HAV REACTIVE (A) 10/19/2023   Lipids: Lab Results  Component Value Date   CHOL 175 10/19/2023   TRIG 226 (H) 10/19/2023   HDL 41 (L) 10/19/2023   CHOLHDL 4.3 10/19/2023   VLDL 31 (H) 02/11/2016   LDLCALC 100 (H) 10/19/2023    Current HIV Regimen: Triumeq  + Prezcobix    Assessment: Courtney Ellis presents to clinic today for HIV follow-up to discuss alternative HIV treatment regimens and strategies to improve adherence/viral load. Has been taking Triumeq /Prezcobix  since 2017 (Triumeq /Prezista /Norvir  before) and states she misses ~3 doses a week on average. Her viral load has been fluctuating with mostly low-level viremia since 2020; this is now clearly explained by her frequent periods of inconsistent adherence. However, she continues to fill both medications on time and consistently through [facility].   When she misses Triumeq /Prezcobix , it is due to not having an appetite and knowing that if she takes these on an empty stomach, they will cause stomach upset which is hard to tolerate. Discussed different strategies to eat consistently and time her ART with meals. States she enjoys eating canned tuna or chicken along with potatoes, crackers, etc. She would like to try protein bars as well. Will try these foods to help relieve stomach upset  when taking ART. She states she is not interested in starting an appetite stimulant at this time. States her appetite has somewhat improved since starting methadone recently. She has also been drinking Ensure in the afternoon within a couple hours of taking her ART; as Ensure could cause binding with Triumeq , this may also contribute to her low-level viremia.   Based on available genotypes, appropriate to transition to a single-tablet regimen such as Symtuza or Biktarvy. Will trial one month of Biktarvy to see if this helps with her stomach upset. Explained that Rainelle is a one pill once daily medication with or without food and the importance of not missing any doses. Explained  resistance and how it develops and why it is so important to take Biktarvy daily and not skip days or doses. Counseled patient to take it around the same time each day. Counseled on what to do if dose is missed, if closer to missed dose take immediately, if closer to next dose then skip and resume normal schedule. Cautioned on possible side effects the first week or so including nausea, diarrhea, dizziness, and headaches but that they should resolve after the first couple of weeks. I reviewed patient medications and found no drug interactions. Counseled patient to separate Biktarvy from divalent cations including her Ensure. Sent next prescription to [facility].   Will repeat viral load today along with Genosure Archive to rule out any additional drug resistance mutations and follow up with Corean in one month.   Eligible for flu, COVID, HBV, and Shingles vaccines; politely declines these today.   Plan: - Stop Triumeq  and Prezcobix  - Start Biktarvy - Separate Biktarvy from Ensure by at least 4 hours and take with food to limit stomach upset  - Check HIV RNA and Genosure Archive  - Follow up with Corean on 03/10/24    Alan Geralds, PharmD, CPP, BCIDP, AAHIVP Clinical Pharmacist Practitioner Infectious  Diseases Clinical Pharmacist [facility] 02/08/2024, 4:33 PM

## 2024-02-08 NOTE — Telephone Encounter (Signed)
 Yes - when I reviewed available genotypes, found the same cumulative resistance profile. I agree Symtuza or Rainelle would be appropriate! - Alan

## 2024-02-09 ENCOUNTER — Ambulatory Visit: Admitting: Infectious Diseases

## 2024-02-11 ENCOUNTER — Other Ambulatory Visit: Payer: Self-pay

## 2024-02-11 ENCOUNTER — Ambulatory Visit (INDEPENDENT_AMBULATORY_CARE_PROVIDER_SITE_OTHER): Admitting: Pharmacist

## 2024-02-11 VITALS — Wt 130.0 lb

## 2024-02-11 DIAGNOSIS — B20 Human immunodeficiency virus [HIV] disease: Secondary | ICD-10-CM

## 2024-02-11 MED ORDER — BICTEGRAVIR-EMTRICITAB-TENOFOV 50-200-25 MG PO TABS: 1.0000 | ORAL_TABLET | Freq: Every day | ORAL | 2 refills | Status: DC

## 2024-02-13 LAB — HIV-1 RNA QUANT-NO REFLEX-BLD
HIV 1 RNA Quant: 2030 {copies}/mL — ABNORMAL HIGH
HIV-1 RNA Quant, Log: 3.31 {Log_copies}/mL — ABNORMAL HIGH

## 2024-02-15 ENCOUNTER — Ambulatory Visit: Admitting: Orthopaedic Surgery

## 2024-02-15 ENCOUNTER — Encounter: Payer: Self-pay | Admitting: Orthopaedic Surgery

## 2024-02-15 ENCOUNTER — Telehealth: Payer: Self-pay | Admitting: Infectious Diseases

## 2024-02-15 ENCOUNTER — Other Ambulatory Visit: Payer: Self-pay | Admitting: Pharmacist

## 2024-02-15 DIAGNOSIS — M25552 Pain in left hip: Secondary | ICD-10-CM | POA: Diagnosis not present

## 2024-02-15 DIAGNOSIS — M1612 Unilateral primary osteoarthritis, left hip: Secondary | ICD-10-CM | POA: Diagnosis not present

## 2024-02-15 DIAGNOSIS — B20 Human immunodeficiency virus [HIV] disease: Secondary | ICD-10-CM

## 2024-02-15 MED ORDER — BICTEGRAVIR-EMTRICITAB-TENOFOV 50-200-25 MG PO TABS: 1.0000 | ORAL_TABLET | Freq: Every day | ORAL | 2 refills | Status: AC

## 2024-02-15 NOTE — Telephone Encounter (Signed)
 Courtney Ellis requesting an urgent call from [place] or [place] regarding her recent visit.  Pt requested a call at [phone].

## 2024-02-15 NOTE — Telephone Encounter (Signed)
 Spoke with patient. States [facility] had not received her biktarvy rx. Resent today.

## 2024-02-15 NOTE — Progress Notes (Signed)
 The patient is a 63 year old female with known and well-documented osteoarthritis of her left hip.  She is a thin individual and does have daily left hip pain and pain in the groin.  It is detrimentally affecting her mobility, her quality of life and her actives daily living and is quite significant.  She is a thin individual.  She is not a diabetic but her main comorbidity is that she is HIV positive and her viral load is right now too high to consider joint replacement surgery.  I had a long and thorough discussion with her and let her know that her CD4 count has to be normal and her viral load undetectable before considering joint replacement surgery due to a high infection risk.  I did see notes from the infectious ease pharmacist and they are working on medications for her.  I did pull up her labs and her numbers have increased when these labs were taken just 4 days ago compared to earlier this year.  She does walk with a Trendelenburg gait and obviously favors her left hip.  Exam her left hip is stiff with internal and external rotation with significant pain.  The right hip moves smoothly and fluidly.  I did go over her x-rays with her to show her the significance of her left hip arthritis.  There is superior joint space narrowing which is near bone-on-bone as well as flattening of the femoral head and osteophytes around the left hip.  The right hip joint space is well-maintained.  I did give her handout about hip replacement surgery.  Once her HIV status is under good control we can safely proceed with joint replacement surgery.  Hopefully we will continue to get intermittent notes from the ID clinic to the point where we can schedule her for a left total hip.

## 2024-02-18 ENCOUNTER — Ambulatory Visit: Admitting: Orthopaedic Surgery

## 2024-02-19 ENCOUNTER — Encounter (HOSPITAL_COMMUNITY): Payer: Self-pay | Admitting: Emergency Medicine

## 2024-02-19 ENCOUNTER — Emergency Department (HOSPITAL_COMMUNITY): Admission: EM | Admit: 2024-02-19 | Discharge: 2024-02-19 | Disposition: A | Discharge status: Home / Self Care

## 2024-02-19 ENCOUNTER — Emergency Department (HOSPITAL_COMMUNITY)

## 2024-02-19 ENCOUNTER — Other Ambulatory Visit: Payer: Self-pay

## 2024-02-19 DIAGNOSIS — Z21 Asymptomatic human immunodeficiency virus [HIV] infection status: Secondary | ICD-10-CM | POA: Diagnosis not present

## 2024-02-19 DIAGNOSIS — I10 Essential (primary) hypertension: Secondary | ICD-10-CM | POA: Diagnosis not present

## 2024-02-19 DIAGNOSIS — M1612 Unilateral primary osteoarthritis, left hip: Secondary | ICD-10-CM | POA: Diagnosis not present

## 2024-02-19 DIAGNOSIS — M25552 Pain in left hip: Secondary | ICD-10-CM | POA: Diagnosis present

## 2024-02-19 LAB — CBC
HCT: 37.6 % (ref 36.0–46.0)
Hemoglobin: 11.7 g/dL — ABNORMAL LOW (ref 12.0–15.0)
MCH: 26.7 pg (ref 26.0–34.0)
MCHC: 31.1 g/dL (ref 30.0–36.0)
MCV: 85.8 fL (ref 80.0–100.0)
Platelets: 250 K/uL (ref 150–400)
RBC: 4.38 MIL/uL (ref 3.87–5.11)
RDW: 13.8 % (ref 11.5–15.5)
WBC: 5.5 K/uL (ref 4.0–10.5)
nRBC: 0 % (ref 0.0–0.2)

## 2024-02-19 LAB — BASIC METABOLIC PANEL WITH GFR
Anion gap: 9 (ref 5–15)
BUN: 25 mg/dL — ABNORMAL HIGH (ref 8–23)
CO2: 23 mmol/L (ref 22–32)
Calcium: 10.4 mg/dL — ABNORMAL HIGH (ref 8.9–10.3)
Chloride: 103 mmol/L (ref 98–111)
Creatinine, Ser: 1.1 mg/dL — ABNORMAL HIGH (ref 0.44–1.00)
GFR, Estimated: 57 mL/min — ABNORMAL LOW (ref >60)
Glucose, Bld: 106 mg/dL — ABNORMAL HIGH (ref 70–99)
Potassium: 4.5 mmol/L (ref 3.5–5.1)
Sodium: 135 mmol/L (ref 135–145)

## 2024-02-19 MED ORDER — OXYCODONE-ACETAMINOPHEN 5-325 MG PO TABS: 1.0000 | ORAL_TABLET | Freq: Four times a day (QID) | ORAL | 0 refills | Status: AC | PRN

## 2024-02-19 MED ORDER — ACETAMINOPHEN 325 MG PO TABS
650.0000 mg | ORAL_TABLET | Freq: Once | ORAL | Status: AC
  Administered 2024-02-19: 650 mg via ORAL
  Filled 2024-02-19: qty 2

## 2024-02-19 MED ORDER — HYDROMORPHONE HCL 1 MG/ML IJ SOLN
1.0000 mg | Freq: Once | INTRAMUSCULAR | Status: AC
  Administered 2024-02-19: 1 mg via INTRAVENOUS
  Filled 2024-02-19: qty 1

## 2024-02-19 MED ORDER — OXYCODONE-ACETAMINOPHEN 5-325 MG PO TABS
1.0000 | ORAL_TABLET | Freq: Once | ORAL | Status: AC
  Administered 2024-02-19: 1 via ORAL
  Filled 2024-02-19: qty 1

## 2024-02-19 MED ORDER — METHOCARBAMOL 500 MG PO TABS
500.0000 mg | ORAL_TABLET | Freq: Once | ORAL | Status: AC
Start: 2024-02-19 — End: 2024-02-19
  Administered 2024-02-19: 500 mg via ORAL
  Filled 2024-02-19: qty 1

## 2024-02-19 MED ORDER — KETOROLAC TROMETHAMINE 15 MG/ML IJ SOLN
15.0000 mg | Freq: Once | INTRAMUSCULAR | Status: AC
  Administered 2024-02-19: 15 mg via INTRAVENOUS
  Filled 2024-02-19: qty 1

## 2024-02-19 NOTE — ED Notes (Signed)
 Pt able to ambulate. She endorses there is still pain but it is much better.

## 2024-02-19 NOTE — ED Notes (Signed)
 Patient transported to X-ray

## 2024-02-19 NOTE — ED Notes (Signed)
 ED Provider at bedside.

## 2024-02-19 NOTE — ED Notes (Signed)
 Unable to obtain blood pressure x 2. Patient hyperventilating and moving arms due to pain.

## 2024-02-19 NOTE — ED Triage Notes (Signed)
 Per [facility] pt coming from home c/o left hip pain. States she has osteoarthritis and has surgery scheduled in the future. Denies any injury. Took motrin this am. Offered tylenol  in ambulance and refused.

## 2024-02-19 NOTE — Discharge Instructions (Signed)
 Please take over-the-counter medication such as Tylenol  or ibuprofen for baseline pain control.  You may use the Percocet for breakthrough pain.  Please follow-up with your infectious disease doctor and your orthopedic doctor for further pain management options.

## 2024-02-19 NOTE — ED Provider Notes (Signed)
 [place] EMERGENCY DEPARTMENT AT [facility] Provider Note   CSN: [id number] Arrival date & time: 02/19/24  1126     Patient presents with: Leg Pain   Courtney Ellis is a 63 y.o. female.   This is a 63 year old female history of HIV and hypertension, heroin abuse presents to the emergency department for left hip pain.  Chronic in nature.  Pain worsened over the past 2 days and particularly bad overnight which kept her from sleeping.  No numbness tingling changes in sensation.  No recent trauma.  Is not taking any pain medications for her hip pain.   Leg Pain      Prior to Admission medications   Medication Sig Start Date End Date Taking? Authorizing Provider  oxyCODONE -acetaminophen  (PERCOCET/ROXICET) 5-325 MG tablet Take 1 tablet by mouth every 6 (six) hours as needed for severe pain (pain score 7-10). 02/19/24  Yes Neysa Caron PARAS, DO  bictegravir-emtricitabine -tenofovir  AF (BIKTARVY) 50-200-25 MG TABS tablet Take 1 tablet by mouth daily. Stop taking Triumeq  and Prezcobix . 02/15/24   Waddell Alan PARAS, RPH-CPP  Ensure (ENSURE) Take 237 mLs by mouth 2 (two) times daily between meals. 01/21/23   Efrain Lamar ORN, MD  methadone (DOLOPHINE) 10 MG/ML solution Take 80 mg by mouth daily.    [provider]    Allergies: Patient has no known allergies.    Review of Systems  Updated Vital Signs BP (!) 168/88 (BP Location: Right Arm)   Pulse 87   Temp 98 F (36.7 C) (Oral)   Resp 17   Ht 5' 3 (1.6 m)   Wt 59 kg   LMP 09/08/2011   SpO2 98%   BMI 23.03 kg/m   Physical Exam Vitals and nursing note reviewed.  Constitutional:      Comments: Uncomfortable appearing  HENT:     Head: Normocephalic.     Nose: Nose normal.     Mouth/Throat:     Mouth: Mucous membranes are moist.  Eyes:     Conjunctiva/sclera: Conjunctivae normal.  Cardiovascular:     Rate and Rhythm: Normal rate and regular rhythm.  Pulmonary:     Effort: Pulmonary effort is normal.      Breath sounds: Normal breath sounds.  Abdominal:     General: Abdomen is flat. There is no distension.     Tenderness: There is no abdominal tenderness.  Musculoskeletal:     Comments: Painful to palpation to her left hip.  Decreased range of motion.  Strong pulses.  Normal sensation.  Able to plantarflex and dorsiflex.  Neurological:     General: No focal deficit present.     Mental Status: She is alert and oriented to person, place, and time.     (all labs ordered are listed, but only abnormal results are displayed) Labs Reviewed  CBC - Abnormal; Notable for the following components:      Result Value   Hemoglobin 11.7 (*)    All other components within normal limits  BASIC METABOLIC PANEL WITH GFR - Abnormal; Notable for the following components:   Glucose, Bld 106 (*)    BUN 25 (*)    Creatinine, Ser 1.10 (*)    Calcium 10.4 (*)    GFR, Estimated 57 (*)    All other components within normal limits    EKG: None  Radiology: DG Femur Min 2 Views Left Result Date: 02/19/2024 CLINICAL DATA:  left leg pain EXAM: LEFT FEMUR 2 VIEWS COMPARISON:  01/15/2024 FINDINGS: No acute  fracture or dislocation. Severe joint space loss of the left hip with bone-on-bone articulation superiorly. Soft tissues are unremarkable. IMPRESSION: 1. No acute fracture or dislocation. 2. Severe osteoarthritis of the left hip with bone-on-bone articulation. Electronically Signed   By: Rogelia Myers M.D.   On: 02/19/2024 16:16   CT PELVIS WO CONTRAST Result Date: 02/19/2024 EXAM: CT PELVIS, WITHOUT IV CONTRAST 02/19/2024 01:26:41 PM TECHNIQUE: Axial images were acquired through the pelvis without IV contrast. Reformatted images were reviewed. Automated exposure control, iterative reconstruction, and/or weight based adjustment of the mA/kV was utilized to reduce the radiation dose to as low as reasonably achievable. COMPARISON: None available. CLINICAL HISTORY: Pelvic fracture. FINDINGS: BONES: No acute  fracture or focal osseous lesion. JOINTS: Severe narrowing of the left hip joint is noted, consistent with osteoarthritis. Minimal left hip joint effusion is noted, potentially related to osteoarthritis. No dislocation. SOFT TISSUES: Cerclage device seen in the pelvis. INTRAPELVIC CONTENTS: Limited images of the intrapelvic contents demonstrate no acute abnormality. IMPRESSION: 1. No acute osseous abnormality. 2. Severe left hip osteoarthritis with minimal left hip joint effusion, likely degenerative. Electronically signed by: Lynwood Seip MD 02/19/2024 02:02 PM EDT RP Workstation: [id number]     Procedures   Medications Ordered in the ED  oxyCODONE -acetaminophen  (PERCOCET/ROXICET) 5-325 MG per tablet 1 tablet (1 tablet Oral Given 02/19/24 1223)  acetaminophen  (TYLENOL ) tablet 650 mg (650 mg Oral Given 02/19/24 1223)  ketorolac  (TORADOL ) 15 MG/ML injection 15 mg (15 mg Intravenous Given 02/19/24 1227)  methocarbamol  (ROBAXIN ) tablet 500 mg (500 mg Oral Given 02/19/24 1223)  HYDROmorphone  (DILAUDID ) injection 1 mg (1 mg Intravenous Given 02/19/24 1402)    Clinical Course as of 02/19/24 1640  Fri Feb 19, 2024  1209 Saw ortho on 10/27 for left hip pain.  It appears from their note she has severe osteoarthritis and recommending hip replacement.  Her viral load for her HIV is too high for them to consider surgery. [TY]  1352 Continues to have severe pain. IV medications ordered.   [TY]  1427 CT PELVIS WO CONTRAST IMPRESSION: 1. No acute osseous abnormality. 2. Severe left hip osteoarthritis with minimal left hip joint effusion, likely degenerative.   [TY]  1513 Basic metabolic panel(!) Minor elevation in creatinine. [TY]  1513 CBC(!) No leukocytosis to suggest infectious process. [TY]  1639 DG Femur Min 2 Views Left No fracture. [TY]  1639 Patient feeling improved after multimodal pain medications.  She did require IV and wanted to get her pain under control while she was moaning and  writhing in the bed.  After medication she is able to ambulate with steady gait.  Discussed follow-up with her primary doctors.  Will give short course of pain medications for breakthrough pain.  Discharged in stable condition. [TY]    Clinical Course User Index [TY] Neysa Caron PARAS, DO                                 Medical Decision Making This is a 63 year old female with past medical history to include hypertension, HIV, heroin abuse, anxiety presenting emergency department with left hip pain.  Saw Ortho on 10/27; see ED course.  Likely pain is secondary to osteoarthritis with acute pain.  Do not appreciate warmth or erythema although exam limited with her being in the hallway.  She has no fever or tachycardia to suggest infectious process either.  However, she is quite uncomfortable on exam.  Will give  Multimodal pain medications.  Will get CT scan as she notes she had a negative x-ray a few days  ago at Ortho however I am unable to see records of such.  The last x-ray I see was from last month.   Amount and/or Complexity of Data Reviewed Labs: ordered. Decision-making details documented in ED Course. Radiology: ordered. Decision-making details documented in ED Course.  Risk OTC drugs. Prescription drug management.        Final diagnoses:  Arthritis of left hip    ED Discharge Orders          Ordered    oxyCODONE -acetaminophen  (PERCOCET/ROXICET) 5-325 MG tablet  Every 6 hours PRN        02/19/24 1603               Neysa Caron PARAS, DO 02/19/24 1640

## 2024-02-22 ENCOUNTER — Encounter: Payer: Self-pay | Admitting: Radiology

## 2024-02-23 ENCOUNTER — Ambulatory Visit: Admitting: Obstetrics and Gynecology

## 2024-02-24 ENCOUNTER — Inpatient Hospital Stay (HOSPITAL_COMMUNITY)
Admission: EM | Admit: 2024-02-24 | Discharge: 2024-03-04 | DRG: 975 | Disposition: A | Discharge status: Home / Self Care | Attending: Family Medicine | Admitting: Family Medicine

## 2024-02-24 ENCOUNTER — Telehealth: Payer: Self-pay

## 2024-02-24 ENCOUNTER — Encounter (HOSPITAL_COMMUNITY): Payer: Self-pay | Admitting: Emergency Medicine

## 2024-02-24 ENCOUNTER — Telehealth: Payer: Self-pay | Admitting: Pharmacist

## 2024-02-24 ENCOUNTER — Other Ambulatory Visit: Payer: Self-pay

## 2024-02-24 DIAGNOSIS — B20 Human immunodeficiency virus [HIV] disease: Secondary | ICD-10-CM | POA: Diagnosis present

## 2024-02-24 DIAGNOSIS — Z8249 Family history of ischemic heart disease and other diseases of the circulatory system: Secondary | ICD-10-CM

## 2024-02-24 DIAGNOSIS — Z818 Family history of other mental and behavioral disorders: Secondary | ICD-10-CM

## 2024-02-24 DIAGNOSIS — B029 Zoster without complications: Secondary | ICD-10-CM | POA: Diagnosis present

## 2024-02-24 DIAGNOSIS — R700 Elevated erythrocyte sedimentation rate: Secondary | ICD-10-CM | POA: Diagnosis present

## 2024-02-24 DIAGNOSIS — B027 Disseminated zoster: Principal | ICD-10-CM | POA: Diagnosis present

## 2024-02-24 DIAGNOSIS — R7982 Elevated C-reactive protein (CRP): Secondary | ICD-10-CM | POA: Diagnosis present

## 2024-02-24 DIAGNOSIS — Z833 Family history of diabetes mellitus: Secondary | ICD-10-CM

## 2024-02-24 DIAGNOSIS — N1831 Chronic kidney disease, stage 3a: Secondary | ICD-10-CM | POA: Diagnosis present

## 2024-02-24 DIAGNOSIS — E876 Hypokalemia: Secondary | ICD-10-CM | POA: Diagnosis present

## 2024-02-24 DIAGNOSIS — F1120 Opioid dependence, uncomplicated: Secondary | ICD-10-CM | POA: Diagnosis present

## 2024-02-24 DIAGNOSIS — Z811 Family history of alcohol abuse and dependence: Secondary | ICD-10-CM

## 2024-02-24 DIAGNOSIS — B019 Varicella without complication: Principal | ICD-10-CM

## 2024-02-24 DIAGNOSIS — E871 Hypo-osmolality and hyponatremia: Secondary | ICD-10-CM | POA: Diagnosis present

## 2024-02-24 DIAGNOSIS — I10 Essential (primary) hypertension: Secondary | ICD-10-CM | POA: Diagnosis present

## 2024-02-24 DIAGNOSIS — Z823 Family history of stroke: Secondary | ICD-10-CM

## 2024-02-24 DIAGNOSIS — N179 Acute kidney failure, unspecified: Secondary | ICD-10-CM | POA: Diagnosis present

## 2024-02-24 DIAGNOSIS — Z813 Family history of other psychoactive substance abuse and dependence: Secondary | ICD-10-CM

## 2024-02-24 DIAGNOSIS — D649 Anemia, unspecified: Secondary | ICD-10-CM | POA: Diagnosis present

## 2024-02-24 DIAGNOSIS — F17210 Nicotine dependence, cigarettes, uncomplicated: Secondary | ICD-10-CM | POA: Diagnosis present

## 2024-02-24 DIAGNOSIS — M1612 Unilateral primary osteoarthritis, left hip: Secondary | ICD-10-CM | POA: Diagnosis present

## 2024-02-24 DIAGNOSIS — Z79899 Other long term (current) drug therapy: Secondary | ICD-10-CM

## 2024-02-24 DIAGNOSIS — I129 Hypertensive chronic kidney disease with stage 1 through stage 4 chronic kidney disease, or unspecified chronic kidney disease: Secondary | ICD-10-CM | POA: Diagnosis present

## 2024-02-24 DIAGNOSIS — Z8619 Personal history of other infectious and parasitic diseases: Secondary | ICD-10-CM

## 2024-02-24 LAB — I-STAT CHEM 8, ED
BUN: 67 mg/dL — ABNORMAL HIGH (ref 8–23)
Calcium, Ion: 1.18 mmol/L (ref 1.15–1.40)
Chloride: 98 mmol/L (ref 98–111)
Creatinine, Ser: 1.7 mg/dL — ABNORMAL HIGH (ref 0.44–1.00)
Glucose, Bld: 123 mg/dL — ABNORMAL HIGH (ref 70–99)
HCT: 40 % (ref 36.0–46.0)
Hemoglobin: 13.6 g/dL (ref 12.0–15.0)
Potassium: 5 mmol/L (ref 3.5–5.1)
Sodium: 131 mmol/L — ABNORMAL LOW (ref 135–145)
TCO2: 25 mmol/L (ref 22–32)

## 2024-02-24 LAB — CBC WITH DIFFERENTIAL/PLATELET
Abs Immature Granulocytes: 0.04 K/uL (ref 0.00–0.07)
Basophils Absolute: 0.1 K/uL (ref 0.0–0.1)
Basophils Relative: 1 %
Eosinophils Absolute: 0.1 K/uL (ref 0.0–0.5)
Eosinophils Relative: 1 %
HCT: 47.5 % — ABNORMAL HIGH (ref 36.0–46.0)
Hemoglobin: 15.1 g/dL — ABNORMAL HIGH (ref 12.0–15.0)
Immature Granulocytes: 0 %
Lymphocytes Relative: 32 %
Lymphs Abs: 2.9 K/uL (ref 0.7–4.0)
MCH: 26.7 pg (ref 26.0–34.0)
MCHC: 31.8 g/dL (ref 30.0–36.0)
MCV: 83.9 fL (ref 80.0–100.0)
Monocytes Absolute: 1.3 K/uL — ABNORMAL HIGH (ref 0.1–1.0)
Monocytes Relative: 14 %
Neutro Abs: 4.6 K/uL (ref 1.7–7.7)
Neutrophils Relative %: 52 %
Platelets: 268 K/uL (ref 150–400)
RBC: 5.66 MIL/uL — ABNORMAL HIGH (ref 3.87–5.11)
RDW: 14.1 % (ref 11.5–15.5)
Smear Review: NORMAL
WBC: 8.9 K/uL (ref 4.0–10.5)
nRBC: 0 % (ref 0.0–0.2)

## 2024-02-24 LAB — COMPREHENSIVE METABOLIC PANEL WITH GFR
ALT: 19 U/L (ref 0–44)
AST: 41 U/L (ref 15–41)
Albumin: 3.6 g/dL (ref 3.5–5.0)
Alkaline Phosphatase: 82 U/L (ref 38–126)
Anion gap: 10 (ref 5–15)
BUN: 52 mg/dL — ABNORMAL HIGH (ref 8–23)
CO2: 23 mmol/L (ref 22–32)
Calcium: 10.1 mg/dL (ref 8.9–10.3)
Chloride: 95 mmol/L — ABNORMAL LOW (ref 98–111)
Creatinine, Ser: 1.51 mg/dL — ABNORMAL HIGH (ref 0.44–1.00)
GFR, Estimated: 39 mL/min — ABNORMAL LOW (ref >60)
Glucose, Bld: 125 mg/dL — ABNORMAL HIGH (ref 70–99)
Potassium: 4.1 mmol/L (ref 3.5–5.1)
Sodium: 128 mmol/L — ABNORMAL LOW (ref 135–145)
Total Bilirubin: 0.9 mg/dL (ref 0.0–1.2)
Total Protein: 9.4 g/dL — ABNORMAL HIGH (ref 6.5–8.1)

## 2024-02-24 MED ORDER — OXYCODONE HCL 5 MG PO TABS
5.0000 mg | ORAL_TABLET | Freq: Once | ORAL | Status: AC
  Administered 2024-02-24: 5 mg via ORAL
  Filled 2024-02-24: qty 1

## 2024-02-24 MED ORDER — DEXTROSE 5 % IV SOLN
10.0000 mg/kg | Freq: Two times a day (BID) | INTRAVENOUS | Status: DC
  Administered 2024-02-24 – 2024-02-29 (×9): 590 mg via INTRAVENOUS
  Filled 2024-02-24 (×12): qty 11.8

## 2024-02-24 MED ORDER — LACTATED RINGERS IV SOLN: INTRAVENOUS | Status: DC

## 2024-02-24 MED ORDER — ONDANSETRON HCL 4 MG/2ML IJ SOLN
4.0000 mg | Freq: Once | INTRAMUSCULAR | Status: AC
  Administered 2024-02-24: 4 mg via INTRAVENOUS
  Filled 2024-02-24: qty 2

## 2024-02-24 MED ORDER — HYDROMORPHONE HCL 1 MG/ML IJ SOLN
1.0000 mg | Freq: Once | INTRAMUSCULAR | Status: AC
  Administered 2024-02-24: 1 mg via INTRAVENOUS
  Filled 2024-02-24: qty 1

## 2024-02-24 MED ORDER — LIDOCAINE 5 % EX PTCH
1.0000 | MEDICATED_PATCH | CUTANEOUS | Status: DC
  Administered 2024-02-24 – 2024-02-25 (×2): 1 via TRANSDERMAL
  Filled 2024-02-24 (×2): qty 1

## 2024-02-24 NOTE — ED Provider Triage Note (Signed)
 Emergency Medicine Provider Triage Evaluation Note  Courtney Ellis , a 62 y.o. female  was evaluated in triage.  Pt complains of rash to left lower back as well as left leg. Patient states she does have a history of chicken pox. Denies fever, chills.  Review of Systems  Positive: Rash to lower back as well as L lower leg Negative: Fever, chills, chest pain, shortness of breath  Physical Exam  BP 116/86 (BP Location: Left Arm)   Pulse 90   Temp 97.7 F (36.5 C) (Oral)   Resp 16   LMP 09/08/2011   SpO2 100%  Gen:   Awake, no distress   Resp:  Normal effort  MSK:   Moves extremities without difficulty  Other:  Shingles like rash present to Left lower back, unable to visualize rest of rash as patient states it extends down into groin in area and patient was in hallway at time of triage  Medical Decision Making  Medically screening exam initiated at 7:41 PM.  Appropriate orders placed.  Courtney Ellis was informed that the remainder of the evaluation will be completed by another provider, this initial triage assessment does not replace that evaluation, and the importance of remaining in the ED until their evaluation is complete.  Orders: CBC, CMP, UA, pain control for shingles   Janetta Terrall FALCON, [place] 02/24/24 1944

## 2024-02-24 NOTE — Progress Notes (Addendum)
 Pharmacy Antibiotic Note  Courtney Ellis is a 63 y.o. female admitted on 02/24/2024 with shingles noted to significant area of body concerning for disseminated infection. Pharmacy has been consulted for acyclovir dosing.  Plan: -Acyclovir 10 mg/kg (590 mg) q12h based on current renal function -Continue LR while patient remains on IV acyclovir to reduce risk of nephrotoxicity -Follow up ability to convert to oral therapy as able     Temp (24hrs), Avg:97.7 F (36.5 C), Min:97.7 F (36.5 C), Max:97.7 F (36.5 C)  Recent Labs  Lab 02/19/24 1418  WBC 5.5  CREATININE 1.10*    Estimated Creatinine Clearance: 44.4 mL/min (A) (by C-G formula based on SCr of 1.1 mg/dL (H)).    No Known Allergies  Antimicrobials this admission: Acyclovir 11/5 >>   Thank you for allowing pharmacy to be a part of this patient's care.  Stefano MARLA Bologna, PharmD, BCPS Clinical Pharmacist 02/24/2024 8:17 PM

## 2024-02-24 NOTE — Telephone Encounter (Signed)
 Patient called today stating she went to ED on 10/31 due to symptoms of arthritis; team prescribed pain medication for her which seemed to help. States she developed bumps on her leg after starting the pain medications which have now gone away; she thought they looked like Shingles when she had googled her symptoms. Now states her left leg is on fire and burning. When prompted, states it has been warm to the touch and has a slight numb feeling intermittently. Reviewed that she needs to visit an urgent care or ED for further management of these symptoms and that [facility] is not currently in the clinic to see her. Reviewed that this would be outside of my scope to evaluate.   Corean - please let me know if you have any other recommendations or thoughts - thanks!   Alan Geralds, PharmD, CPP, BCIDP, AAHIVP Clinical Pharmacist Practitioner Infectious Diseases Clinical Pharmacist [facility]

## 2024-02-24 NOTE — ED Provider Notes (Signed)
 [place] EMERGENCY DEPARTMENT AT [facility] Provider Note   CSN: [id number] Arrival date & time: 02/24/24  1854     Patient presents with: Rash   Courtney Ellis is a 63 y.o. female.   63 yo F with a chief complaints of left leg pain.  This been going on for about a week or so.  Was seen in the emergency department setting and had a workup performed.  Since then she has developed blistering painful rash.  She called her ID clinic and they encouraged her to come here for evaluation.     Rash      Prior to Admission medications   Medication Sig Start Date End Date Taking? Authorizing Provider  bictegravir-emtricitabine -tenofovir  AF (BIKTARVY) 50-200-25 MG TABS tablet Take 1 tablet by mouth daily. Stop taking Triumeq  and Prezcobix . 02/15/24   Waddell Alan PARAS, RPH-CPP  Ensure (ENSURE) Take 237 mLs by mouth 2 (two) times daily between meals. 01/21/23   Efrain Lamar ORN, MD  methadone (DOLOPHINE) 10 MG/ML solution Take 80 mg by mouth daily.    [provider]  oxyCODONE -acetaminophen  (PERCOCET/ROXICET) 5-325 MG tablet Take 1 tablet by mouth every 6 (six) hours as needed for severe pain (pain score 7-10). 02/19/24   Neysa Caron PARAS, DO    Allergies: Patient has no known allergies.    Review of Systems  Skin:  Positive for rash.    Updated Vital Signs BP (!) 147/92 (BP Location: Right Arm)   Pulse 91   Temp 99.6 F (37.6 C) (Oral)   Resp 16   LMP 09/08/2011   SpO2 100%   Physical Exam Vitals and nursing note reviewed.  Constitutional:      General: She is not in acute distress.    Appearance: She is well-developed. She is not diaphoretic.  HENT:     Head: Normocephalic and atraumatic.  Eyes:     Pupils: Pupils are equal, round, and reactive to light.  Cardiovascular:     Rate and Rhythm: Normal rate and regular rhythm.     Heart sounds: No murmur heard.    No friction rub. No gallop.  Pulmonary:     Effort: Pulmonary effort is normal.      Breath sounds: No wheezing or rales.  Abdominal:     General: There is no distension.     Palpations: Abdomen is soft.     Tenderness: There is no abdominal tenderness.  Musculoskeletal:        General: No tenderness.     Cervical back: Normal range of motion and neck supple.     Comments: Pulse and motor intact to the left lower extremity.  Patient does have some trouble with dorsiflexion and plantarflexion without leg.  Vesicular rash predominantly on the anterior aspects of the left leg but also on the exterior.  She also has rash diffusely about her torso and arms.    Skin:    General: Skin is warm and dry.  Neurological:     Mental Status: She is alert and oriented to person, place, and time.  Psychiatric:        Behavior: Behavior normal.     (all labs ordered are listed, but only abnormal results are displayed) Labs Reviewed  CBC WITH DIFFERENTIAL/PLATELET - Abnormal; Notable for the following components:      Result Value   RBC 5.66 (*)    Hemoglobin 15.1 (*)    HCT 47.5 (*)    Monocytes Absolute 1.3 (*)  All other components within normal limits  I-STAT CHEM 8, ED - Abnormal; Notable for the following components:   Sodium 131 (*)    BUN 67 (*)    Creatinine, Ser 1.70 (*)    Glucose, Bld 123 (*)    All other components within normal limits  URINALYSIS, ROUTINE W REFLEX MICROSCOPIC  COMPREHENSIVE METABOLIC PANEL WITH GFR    EKG: None  Radiology: No results found.   .Critical Care  Performed by: Emil Share, DO Authorized by: Emil Share, DO   Critical care provider statement:    Critical care time (minutes):  35   Critical care time was exclusive of:  Separately billable procedures and treating other patients   Critical care was time spent personally by me on the following activities:  Development of treatment plan with patient or surrogate, discussions with consultants, evaluation of patient's response to treatment, examination of patient, ordering and  review of laboratory studies, ordering and review of radiographic studies, ordering and performing treatments and interventions, pulse oximetry, re-evaluation of patient's condition and review of old charts   Care discussed with: admitting provider      Medications Ordered in the ED  lidocaine  (LIDODERM ) 5 % 1 patch (1 patch Transdermal Patch Applied 02/24/24 2030)  acyclovir (ZOVIRAX) 590 mg in dextrose  5 % 100 mL IVPB (590 mg Intravenous New Bag/Given 02/24/24 2135)  lactated ringers  infusion ( Intravenous New Bag/Given 02/24/24 2045)  oxyCODONE  (Oxy IR/ROXICODONE ) immediate release tablet 5 mg (5 mg Oral Given 02/24/24 2020)  HYDROmorphone  (DILAUDID ) injection 1 mg (1 mg Intravenous Given 02/24/24 2040)  ondansetron  (ZOFRAN ) injection 4 mg (4 mg Intravenous Given 02/24/24 2045)                                    Medical Decision Making Amount and/or Complexity of Data Reviewed Radiology: ordered.  Risk Prescription drug management.   63 yo F with a chief complaints of painful rash.  The patient clinically has shingles.  Unfortunately she also has it all over her body.  I am concerned about disseminated varicella.  I will start her on IV acyclovir.  I did discuss the case with ID.  Agrees with treatment.  Recommends that she be airborne precautions.  Patient with radicular back pain.  She does endorse IV drug use.  Will order an MRI of the spine.  She is HIV positive.  Last CD4 count around 400.  I discussed case with the hospitalist.  He would prefer to have MRI performed, not available currently at this facility.  I discussed case with Dr. Haze accepts the patient in ED to ED transfer to [facility].  The patients results and plan were reviewed and discussed.   Any x-rays performed were independently reviewed by myself.   Differential diagnosis were considered with the presenting HPI.  Medications  lidocaine  (LIDODERM ) 5 % 1 patch (1 patch Transdermal Patch Applied 02/24/24  2030)  acyclovir (ZOVIRAX) 590 mg in dextrose  5 % 100 mL IVPB (590 mg Intravenous New Bag/Given 02/24/24 2135)  lactated ringers  infusion ( Intravenous New Bag/Given 02/24/24 2045)  oxyCODONE  (Oxy IR/ROXICODONE ) immediate release tablet 5 mg (5 mg Oral Given 02/24/24 2020)  HYDROmorphone  (DILAUDID ) injection 1 mg (1 mg Intravenous Given 02/24/24 2040)  ondansetron  (ZOFRAN ) injection 4 mg (4 mg Intravenous Given 02/24/24 2045)    Vitals:   02/24/24 1903 02/24/24 2250  BP: 116/86 (!) 147/92  Pulse:  90 91  Resp: 16 16  Temp: 97.7 F (36.5 C) 99.6 F (37.6 C)  TempSrc: Oral Oral  SpO2: 100% 100%    Final diagnoses:  Disseminated varicella    Admission/ observation were discussed with the admitting physician, patient and/or family and they are comfortable with the plan.       Final diagnoses:  Disseminated varicella    ED Discharge Orders     None          Emil Share, DO 02/24/24 2322

## 2024-02-24 NOTE — ED Triage Notes (Signed)
 Per [facility] pt coming from home c/o pain and rash to lower back and left leg. Seen here a few days ago for same pain.

## 2024-02-24 NOTE — Telephone Encounter (Signed)
 Thank you! Called and writing a new telephone note now.

## 2024-02-24 NOTE — Telephone Encounter (Signed)
 I would not recommend ER - Urgent Care would be well suited to see her for this problem.   She can even go online to schedule an appt so she does not have to wait too long to be seen.   [email or web address]

## 2024-02-24 NOTE — Telephone Encounter (Signed)
 Henessy left voice mail requesting an urgent call from [place] or [place].Best contact number is [phone].

## 2024-02-25 ENCOUNTER — Inpatient Hospital Stay (HOSPITAL_COMMUNITY)

## 2024-02-25 ENCOUNTER — Encounter (HOSPITAL_COMMUNITY): Payer: Self-pay | Admitting: Internal Medicine

## 2024-02-25 DIAGNOSIS — I10 Essential (primary) hypertension: Secondary | ICD-10-CM | POA: Diagnosis not present

## 2024-02-25 DIAGNOSIS — Z818 Family history of other mental and behavioral disorders: Secondary | ICD-10-CM | POA: Diagnosis not present

## 2024-02-25 DIAGNOSIS — N179 Acute kidney failure, unspecified: Secondary | ICD-10-CM | POA: Diagnosis present

## 2024-02-25 DIAGNOSIS — B029 Zoster without complications: Secondary | ICD-10-CM | POA: Diagnosis present

## 2024-02-25 DIAGNOSIS — Z743 Need for continuous supervision: Secondary | ICD-10-CM | POA: Diagnosis not present

## 2024-02-25 DIAGNOSIS — I129 Hypertensive chronic kidney disease with stage 1 through stage 4 chronic kidney disease, or unspecified chronic kidney disease: Secondary | ICD-10-CM | POA: Diagnosis present

## 2024-02-25 DIAGNOSIS — E876 Hypokalemia: Secondary | ICD-10-CM | POA: Diagnosis present

## 2024-02-25 DIAGNOSIS — B20 Human immunodeficiency virus [HIV] disease: Secondary | ICD-10-CM

## 2024-02-25 DIAGNOSIS — F17210 Nicotine dependence, cigarettes, uncomplicated: Secondary | ICD-10-CM | POA: Diagnosis present

## 2024-02-25 DIAGNOSIS — B019 Varicella without complication: Secondary | ICD-10-CM

## 2024-02-25 DIAGNOSIS — Z833 Family history of diabetes mellitus: Secondary | ICD-10-CM | POA: Diagnosis not present

## 2024-02-25 DIAGNOSIS — Z811 Family history of alcohol abuse and dependence: Secondary | ICD-10-CM | POA: Diagnosis not present

## 2024-02-25 DIAGNOSIS — F1120 Opioid dependence, uncomplicated: Secondary | ICD-10-CM | POA: Diagnosis present

## 2024-02-25 DIAGNOSIS — E871 Hypo-osmolality and hyponatremia: Secondary | ICD-10-CM | POA: Diagnosis present

## 2024-02-25 DIAGNOSIS — Z8619 Personal history of other infectious and parasitic diseases: Secondary | ICD-10-CM | POA: Diagnosis not present

## 2024-02-25 DIAGNOSIS — R7982 Elevated C-reactive protein (CRP): Secondary | ICD-10-CM | POA: Diagnosis present

## 2024-02-25 DIAGNOSIS — N1831 Chronic kidney disease, stage 3a: Secondary | ICD-10-CM | POA: Diagnosis present

## 2024-02-25 DIAGNOSIS — Z79899 Other long term (current) drug therapy: Secondary | ICD-10-CM

## 2024-02-25 DIAGNOSIS — B027 Disseminated zoster: Secondary | ICD-10-CM

## 2024-02-25 DIAGNOSIS — M1612 Unilateral primary osteoarthritis, left hip: Secondary | ICD-10-CM | POA: Diagnosis present

## 2024-02-25 DIAGNOSIS — Z8249 Family history of ischemic heart disease and other diseases of the circulatory system: Secondary | ICD-10-CM | POA: Diagnosis not present

## 2024-02-25 DIAGNOSIS — Z813 Family history of other psychoactive substance abuse and dependence: Secondary | ICD-10-CM | POA: Diagnosis not present

## 2024-02-25 DIAGNOSIS — M79605 Pain in left leg: Secondary | ICD-10-CM | POA: Diagnosis not present

## 2024-02-25 DIAGNOSIS — R700 Elevated erythrocyte sedimentation rate: Secondary | ICD-10-CM | POA: Diagnosis present

## 2024-02-25 DIAGNOSIS — Z823 Family history of stroke: Secondary | ICD-10-CM | POA: Diagnosis not present

## 2024-02-25 LAB — CBC
HCT: 37.2 % (ref 36.0–46.0)
Hemoglobin: 12.6 g/dL (ref 12.0–15.0)
MCH: 27.6 pg (ref 26.0–34.0)
MCHC: 33.9 g/dL (ref 30.0–36.0)
MCV: 81.4 fL (ref 80.0–100.0)
Platelets: 248 K/uL (ref 150–400)
RBC: 4.57 MIL/uL (ref 3.87–5.11)
RDW: 14 % (ref 11.5–15.5)
WBC: 6.7 K/uL (ref 4.0–10.5)
nRBC: 0 % (ref 0.0–0.2)

## 2024-02-25 LAB — RAPID URINE DRUG SCREEN, HOSP PERFORMED
Amphetamines: NOT DETECTED
Barbiturates: NOT DETECTED
Benzodiazepines: NOT DETECTED
Cocaine: NOT DETECTED
Opiates: POSITIVE — AB
Tetrahydrocannabinol: NOT DETECTED

## 2024-02-25 LAB — URINALYSIS, ROUTINE W REFLEX MICROSCOPIC
Bilirubin Urine: NEGATIVE
Glucose, UA: NEGATIVE mg/dL
Ketones, ur: NEGATIVE mg/dL
Nitrite: NEGATIVE
Protein, ur: 100 mg/dL — AB
Specific Gravity, Urine: 1.026 (ref 1.005–1.030)
pH: 5 (ref 5.0–8.0)

## 2024-02-25 LAB — SEDIMENTATION RATE: Sed Rate: 62 mm/h — ABNORMAL HIGH (ref 0–22)

## 2024-02-25 LAB — BASIC METABOLIC PANEL WITH GFR
Anion gap: 11 (ref 5–15)
BUN: 41 mg/dL — ABNORMAL HIGH (ref 8–23)
CO2: 22 mmol/L (ref 22–32)
Calcium: 9.4 mg/dL (ref 8.9–10.3)
Chloride: 95 mmol/L — ABNORMAL LOW (ref 98–111)
Creatinine, Ser: 1.53 mg/dL — ABNORMAL HIGH (ref 0.44–1.00)
GFR, Estimated: 38 mL/min — ABNORMAL LOW (ref >60)
Glucose, Bld: 119 mg/dL — ABNORMAL HIGH (ref 70–99)
Potassium: 3.9 mmol/L (ref 3.5–5.1)
Sodium: 128 mmol/L — ABNORMAL LOW (ref 135–145)

## 2024-02-25 LAB — HEPATIC FUNCTION PANEL
ALT: 20 U/L (ref 0–44)
AST: 36 U/L (ref 15–41)
Albumin: 3 g/dL — ABNORMAL LOW (ref 3.5–5.0)
Alkaline Phosphatase: 65 U/L (ref 38–126)
Bilirubin, Direct: 0.2 mg/dL (ref 0.0–0.2)
Indirect Bilirubin: 0.7 mg/dL (ref 0.3–0.9)
Total Bilirubin: 0.9 mg/dL (ref 0.0–1.2)
Total Protein: 9 g/dL — ABNORMAL HIGH (ref 6.5–8.1)

## 2024-02-25 LAB — C-REACTIVE PROTEIN: CRP: 2 mg/dL — ABNORMAL HIGH (ref <1.0)

## 2024-02-25 MED ORDER — ENSURE SURGERY PO LIQD
237.0000 mL | Freq: Two times a day (BID) | ORAL | Status: DC
  Administered 2024-02-25 – 2024-03-04 (×16): 237 mL via ORAL
  Filled 2024-02-25 (×16): qty 237

## 2024-02-25 MED ORDER — METHADONE HCL 10 MG PO TABS
80.0000 mg | ORAL_TABLET | Freq: Every day | ORAL | Status: DC
  Administered 2024-02-25 – 2024-03-04 (×9): 80 mg via ORAL
  Filled 2024-02-25 (×9): qty 8

## 2024-02-25 MED ORDER — ONDANSETRON HCL 4 MG/2ML IJ SOLN: 4.0000 mg | Freq: Four times a day (QID) | INTRAMUSCULAR | Status: DC | PRN

## 2024-02-25 MED ORDER — ACETAMINOPHEN 325 MG PO TABS
650.0000 mg | ORAL_TABLET | Freq: Four times a day (QID) | ORAL | Status: DC | PRN
  Administered 2024-02-25: 650 mg via ORAL
  Filled 2024-02-25: qty 2

## 2024-02-25 MED ORDER — GADOBUTROL 1 MMOL/ML IV SOLN
6.0000 mL | Freq: Once | INTRAVENOUS | Status: AC | PRN
  Administered 2024-02-25: 6 mL via INTRAVENOUS

## 2024-02-25 MED ORDER — AMLODIPINE BESYLATE 5 MG PO TABS
5.0000 mg | ORAL_TABLET | Freq: Every day | ORAL | Status: DC
  Administered 2024-02-25 – 2024-03-04 (×9): 5 mg via ORAL
  Filled 2024-02-25 (×9): qty 1

## 2024-02-25 MED ORDER — BICTEGRAVIR-EMTRICITAB-TENOFOV 50-200-25 MG PO TABS
1.0000 | ORAL_TABLET | Freq: Every day | ORAL | Status: DC
  Administered 2024-02-25 – 2024-03-04 (×9): 1 via ORAL
  Filled 2024-02-25 (×11): qty 1

## 2024-02-25 MED ORDER — HYDROMORPHONE HCL 1 MG/ML IJ SOLN
0.2000 mg | INTRAMUSCULAR | Status: AC | PRN
  Administered 2024-02-25 – 2024-02-29 (×6): 0.2 mg via INTRAVENOUS
  Filled 2024-02-25: qty 1
  Filled 2024-02-25 (×2): qty 0.5
  Filled 2024-02-25: qty 1
  Filled 2024-02-25 (×2): qty 0.5

## 2024-02-25 MED ORDER — IPRATROPIUM-ALBUTEROL 0.5-2.5 (3) MG/3ML IN SOLN: 3.0000 mL | RESPIRATORY_TRACT | Status: DC | PRN

## 2024-02-25 MED ORDER — HYDRALAZINE HCL 10 MG PO TABS
10.0000 mg | ORAL_TABLET | Freq: Four times a day (QID) | ORAL | Status: DC | PRN
Start: 2024-02-25 — End: 2024-03-04
  Administered 2024-02-25 (×2): 10 mg via ORAL
  Filled 2024-02-25 (×2): qty 1

## 2024-02-25 MED ORDER — METOPROLOL TARTRATE 5 MG/5ML IV SOLN
5.0000 mg | INTRAVENOUS | Status: DC | PRN
  Administered 2024-02-28: 5 mg via INTRAVENOUS
  Filled 2024-02-25: qty 5

## 2024-02-25 NOTE — ED Notes (Signed)
 PT reminded a urine sample is needed. PER PT she forgot to take the cup when she went to the restroom.

## 2024-02-25 NOTE — ED Notes (Signed)
 Pt in MRI.

## 2024-02-25 NOTE — Consult Note (Addendum)
 [facility]    Date of Admission:  02/24/2024      Total days of antibiotics 0   Acyclovir   Biktarvy             Reason for Consult: Disseminated Zoster     Referring Provider: Caleen Primary Care Provider: Pcp, No    Assessment: Courtney Ellis is a 63 y.o. female admitted with:   Acute Zoster Infection -  Concern for dissemination given multiple lesions in areas that seem less likely to have been auto-inoculated (ie: midline of back, etc). Will treat as disseminated case  She does have headache but no neck pain or AMS. No pulmonary symptoms. No indication for LP for now. Will see how she does on therapy and hopefully improves quick. Brain MRI normal appearing.  - Agree with IV Acyclovir 10mg /kg - adjusted to 12 hours with kidney function monitoring - Ensure topical treatments for shingles do not go directly on her active shingles lesions as it can cause increased uptake of the analgesia d/t disrupted skin barrier - use on adjacent intact skin - cool compresses can help with pain control  - would consider gabapentin, oral NSAIDS, etc for multimodal effect.  - Airborne + Contact Isolation until lesions rupture and crust over   A/CKD -  Creatinine clearance 32 mL/min - medication adjusted for consideration of kidney function  - will need maintenance IV fluids to lessen r/t crystalization  - daily BMP   HIV -  Intermittent treatment adherence. Just switched from Triumeq +Prezcobix  to [facility] a few weeks ago after recent cumulative genotype revealed: [id number], [id number], M184V. - We talked about taking this with food to help with GI side effects as she moves through them  - keep biktarvy separate from multivitamins   Back Pain -  MRI results noted that may be concern for early septic facet joint vs facet joint arthritis.  I think ESR / CRP will be hard to interpret in the setting of active disseminated zoster.   Isolation Recommendations -   Airborne + Contact    Plan: - IV Acyclovir 10mg /kg - adjusted to 12 hours - daily BMP  - Ensure topical treatments for shingles do not go directly on her active shingles lesions as it can cause increased uptake of the analgesia d/t disrupted skin barrier - use on adjacent intact skin - cool compresses can help with pain control  - would consider gabapentin, oral NSAIDS, etc for multimodal effect.  - Airborne + Contact Isolation until lesions rupture and crust over   - [employer] everyday - repeat MRI in 10d to FU abnormalities   Principal Problem:   Shingles Active Problems:   Human immunodeficiency virus (HIV) disease (HCC)   Anemia   Essential hypertension   ARF (acute renal failure)   Unilateral primary osteoarthritis, left hip    amLODipine   5 mg Oral Daily   bictegravir-emtricitabine -tenofovir  AF  1 tablet Oral Daily   lidocaine   1 patch Transdermal Q24H   methadone  80 mg Oral Daily    HPI: Courtney Ellis is a 63 y.o. female in ER awaiting hospital admission.   She has h/o HIV infection not currently well controlled as she has been off of biktarvy for a while now. She called our office yesterday to say she had been seen in ER 10/31 for hip pain that was described to be burning - visit felt that this was more d/t osteoarthritis.  Tuesday/Wednesday noted to have a eruption of rash on outer thigh/hip buttock and several scattered lesions on arms, head/face and mid/upper back.    Hx HIV - last CD4 ~400 in June this year. VL not controlled at ~2000 copies. Has been off or a little while and noticed some nausea/stomach upset with biktarvy after switching off Triumeq  + Prezcobix . Last saw Alan 10/23.    Review of Systems: Review of Systems  Constitutional:  Negative for chills and fever.  Eyes:  Negative for blurred vision and double vision.  Respiratory: Negative.    Cardiovascular: Negative.   Gastrointestinal:  Positive for nausea. Negative for abdominal pain and  vomiting.  Genitourinary: Negative.   Musculoskeletal:  Negative for neck pain.  Skin:  Positive for rash.  Neurological:  Positive for headaches.    Past Medical History:  Diagnosis Date   Anxiety    Depression    Hepatitis    C- treated   Heroin abuse (HCC)    HIV infection (HCC)    Hypertension     Social History   Tobacco Use   Smoking status: Some Days    Types: Cigarettes    Passive exposure: Past   Smokeless tobacco: Never   Tobacco comments:    3-4 CIG  DAILY   Substance Use Topics   Alcohol use: No    Alcohol/week: 0.0 standard drinks of alcohol   Drug use: Not Currently    Types: IV, Heroin    Comment: No I    Family History  Problem Relation Age of Onset   Hypertension Mother    Alcohol abuse Mother    Depression Mother    Diabetes Mother    Drug abuse Mother    Alcohol abuse Father    Cancer Father    Depression Father    Drug abuse Father    Depression Sister    Diabetes Sister    Drug abuse Sister    Mental illness Sister    Depression Brother    Drug abuse Brother    Mental illness Brother    Depression Maternal Aunt    Drug abuse Maternal Aunt    Depression Maternal Uncle    Drug abuse Maternal Uncle    Depression Paternal Aunt    Diabetes Paternal Aunt    Drug abuse Paternal Aunt    Stroke Paternal Aunt    Alcohol abuse Paternal Uncle    Depression Paternal Uncle    Diabetes Paternal Uncle    Drug abuse Paternal Uncle    Kidney disease Paternal Uncle    No Known Allergies  OBJECTIVE: Blood pressure (!) 211/105, pulse 96, temperature 97.7 F (36.5 C), temperature source Oral, resp. rate 18, last menstrual period 09/08/2011, SpO2 97%.  Physical Exam Constitutional:      Appearance: She is well-developed.  HENT:     Mouth/Throat:     Mouth: No oral lesions.     Dentition: Normal dentition. No dental abscesses.     Pharynx: No oropharyngeal exudate.  Cardiovascular:     Rate and Rhythm: Normal rate and regular rhythm.      Heart sounds: Normal heart sounds.  Pulmonary:     Effort: Pulmonary effort is normal.     Breath sounds: Normal breath sounds.  Abdominal:     General: There is no distension.     Palpations: Abdomen is soft.     Tenderness: There is no abdominal tenderness.  Lymphadenopathy:     Cervical: No cervical adenopathy.  Skin:    General: Skin is warm and dry.     Findings: No rash.  Neurological:     Mental Status: She is alert and oriented to person, place, and time.  Psychiatric:        Judgment: Judgment normal.           Lab Results Lab Results  Component Value Date   WBC 6.7 02/25/2024   HGB 12.6 02/25/2024   HCT 37.2 02/25/2024   MCV 81.4 02/25/2024   PLT 248 02/25/2024    Lab Results  Component Value Date   CREATININE 1.53 (H) 02/25/2024   BUN 41 (H) 02/25/2024   NA 128 (L) 02/25/2024   K 3.9 02/25/2024   CL 95 (L) 02/25/2024   CO2 22 02/25/2024    Lab Results  Component Value Date   ALT 20 02/25/2024   AST 36 02/25/2024   GGT 98 (H) 09/10/2017   ALKPHOS 65 02/25/2024   BILITOT 0.9 02/25/2024     Microbiology: No results found for this or any previous visit (from the past 240 hours).  Corean Fireman, MSN, NP-C [facility] Pager: [phone]  02/25/2024 9:45 AM    Total Encounter Time: 30 m

## 2024-02-25 NOTE — Hospital Course (Addendum)
 Brief Narrative:   63 year old with history of HIV, substance abuse, HTN, OA, hepatitis C treated for left lower extremity pain about a week ago comes to the ER with concerns of possible rash concerning for shingles.  In the ER noted to have mild hyponatremia.  She also reported of back pain.  ID was called and patient was started on IV acyclovir due to concerns of disseminated varicella-zoster.  Assessment & Plan:  Varicella-zoster infection left lower extremity Back pain -Due to concerns of possible disseminated disease, on empiric IV acyclovir per ID recommendations especially in her immunocompromise state.  MRI of the cervical and thoracic spine shows chronic changes but in the lumbar spine there is concerns of possible early infection therefore will need repeat MRI in the next 10-14 days.  ID team is following - MRI brain negative - Mildly elevated CRP and ESR  Acute kidney injury on CKD stage IIIa; improved.  Hyponatremia, hypokalemia - Baseline creatinine 1.1, admission creatinine 1.7.  Resolved with IV fluids  HIV - On Biktarvy  Severe osteoarthritis of the left hip - Follows outpatient orthopedic, Dr. Vernetta  Essential hypertension - Norvasc , IV as needed  History of polysubstance abuse - On methadone  Hypomagnesemia - As needed repletion   DVT prophylaxis: SCDs Start: 02/25/24 0155      Code Status: Full Code Family Communication:   Status is: Inpatient Remains inpatient appropriate because: Continue hospital stay for management of disseminated VZV   PT Follow up Recs:   Subjective: No new complaints Examination:  General exam: Appears calm and comfortable  Respiratory system: Clear to auscultation. Respiratory effort normal. Cardiovascular system: S1 & S2 heard, RRR. No JVD, murmurs, rubs, gallops or clicks. No pedal edema. Gastrointestinal system: Abdomen is nondistended, soft and nontender. No organomegaly or masses felt. Normal bowel sounds  heard. Central nervous system: Alert and oriented. No focal neurological deficits. Extremities: Symmetric 5 x 5 power. Skin: Left thigh and coccyx area vesicular lesion noted Psychiatry: Judgement and insight appear normal. Mood & affect appropriate.

## 2024-02-25 NOTE — ED Notes (Signed)
 PT set up for breakfast at this time. Breathing is even and unlabored. Call light within reach encouraged to use when needs arise.  PT aware a urine sample is needed.

## 2024-02-25 NOTE — Progress Notes (Signed)
 PROGRESS NOTE    Courtney Ellis  FMW:[id number] DOB: [birth year] DOA: 02/24/2024 PCP: Pcp, No    Brief Narrative:   63 year old with history of HIV, substance abuse, HTN, OA, hepatitis C treated for left lower extremity pain about a week ago comes to the ER with concerns of possible rash concerning for shingles.  In the ER noted to have mild hyponatremia.  She also reported of back pain.  ID was called and patient was started on IV acyclovir due to concerns of disseminated varicella-zoster.  Assessment & Plan:  Varicella-zoster infection left lower extremity Back pain -Due to concerns of possible disseminated disease, on empiric IV acyclovir per ID recommendations especially in her immunocompromise state.  MRI of the cervical and thoracic spine shows chronic changes but in the lumbar spine there is concerns of possible early infection therefore will need repeat MRI in the next 10-14 days.  In the meantime we will discuss with ID to likely continue anti-infective treatment. -Check CRP and ESR  Acute kidney injury on CKD stage IIIa Hyponatremia, hypokalemia - Baseline creatinine 1.1, admission creatinine 1.7.  Continue IV fluids  HIV - On Biktarvy  Severe osteoarthritis of the left hip - Follows outpatient orthopedic, Dr. Vernetta  Essential hypertension - Norvasc , IV as needed  History of polysubstance abuse - On methadone   DVT prophylaxis: SCDs Start: 02/25/24 0155      Code Status: Full Code Family Communication:   Status is: Inpatient Remains inpatient appropriate because: Cont hosp stay for ID eval   PT Follow up Recs:   Subjective: Feels ok no new complaints.    Examination:  General exam: Appears calm and comfortable  Respiratory system: Clear to auscultation. Respiratory effort normal. Cardiovascular system: S1 & S2 heard, RRR. No JVD, murmurs, rubs, gallops or clicks. No pedal edema. Gastrointestinal system: Abdomen is nondistended, soft and nontender. No  organomegaly or masses felt. Normal bowel sounds heard. Central nervous system: Alert and oriented. No focal neurological deficits. Extremities: Symmetric 5 x 5 power. Skin: No rashes, lesions or ulcers Psychiatry: Judgement and insight appear normal. Mood & affect appropriate.                Diet Orders (From admission, onward)     Start     Ordered   02/25/24 0155  Diet Heart Room service appropriate? Yes; Fluid consistency: Thin  Diet effective now       Question Answer Comment  Room service appropriate? Yes   Fluid consistency: Thin      02/25/24 0155            Objective: Vitals:   02/25/24 0600 02/25/24 0607 02/25/24 0900 02/25/24 0915  BP:  (!) 211/105 (!) 174/101   Pulse: 83 79 96 87  Resp: 12 17 18 18   Temp:   97.7 F (36.5 C)   TempSrc:   Oral   SpO2: 100% 100% 97% 98%    Intake/Output Summary (Last 24 hours) at 02/25/2024 1050 Last data filed at 02/24/2024 2329 Gross per 24 hour  Intake 112.74 ml  Output --  Net 112.74 ml   There were no vitals filed for this visit.  Scheduled Meds:  amLODipine   5 mg Oral Daily   bictegravir-emtricitabine -tenofovir  AF  1 tablet Oral Daily   lidocaine   1 patch Transdermal Q24H   methadone  80 mg Oral Daily   Continuous Infusions:  acyclovir Stopped (02/24/24 2329)   lactated ringers  Stopped (02/25/24 0455)    Nutritional status  There is no height or weight on file to calculate BMI.  Data Reviewed:   CBC: Recent Labs  Lab 02/19/24 1418 02/24/24 1941 02/24/24 2252 02/25/24 0455  WBC 5.5 8.9  --  6.7  NEUTROABS  --  4.6  --   --   HGB 11.7* 15.1* 13.6 12.6  HCT 37.6 47.5* 40.0 37.2  MCV 85.8 83.9  --  81.4  PLT 250 268  --  248   Basic Metabolic Panel: Recent Labs  Lab 02/19/24 1418 02/24/24 2247 02/24/24 2252 02/25/24 0455  NA 135 128* 131* 128*  K 4.5 4.1 5.0 3.9  CL 103 95* 98 95*  CO2 23 23  --  22  GLUCOSE 106* 125* 123* 119*  BUN 25* 52* 67* 41*  CREATININE 1.10* 1.51*  1.70* 1.53*  CALCIUM 10.4* 10.1  --  9.4   GFR: Estimated Creatinine Clearance: 31.9 mL/min (A) (by C-G formula based on SCr of 1.53 mg/dL (H)). Liver Function Tests: Recent Labs  Lab 02/24/24 2247 02/25/24 0455  AST 41 36  ALT 19 20  ALKPHOS 82 65  BILITOT 0.9 0.9  PROT 9.4* 9.0*  ALBUMIN 3.6 3.0*   No results for input(s): LIPASE, AMYLASE in the last 168 hours. No results for input(s): AMMONIA in the last 168 hours. Coagulation Profile: No results for input(s): INR, PROTIME in the last 168 hours. Cardiac Enzymes: No results for input(s): CKTOTAL, CKMB, CKMBINDEX, TROPONINI in the last 168 hours. BNP (last 3 results) No results for input(s): PROBNP in the last 8760 hours. HbA1C: No results for input(s): HGBA1C in the last 72 hours. CBG: No results for input(s): GLUCAP in the last 168 hours. Lipid Profile: No results for input(s): CHOL, HDL, LDLCALC, TRIG, CHOLHDL, LDLDIRECT in the last 72 hours. Thyroid Function Tests: No results for input(s): TSH, T4TOTAL, FREET4, T3FREE, THYROIDAB in the last 72 hours. Anemia Panel: No results for input(s): VITAMINB12, FOLATE, FERRITIN, TIBC, IRON, RETICCTPCT in the last 72 hours. Sepsis Labs: No results for input(s): PROCALCITON, LATICACIDVEN in the last 168 hours.  No results found for this or any previous visit (from the past 240 hours).       Radiology Studies: MR Lumbar Spine W Wo Contrast Result Date: 02/25/2024 EXAM: MRI LUMBAR SPINE 02/25/2024 03:57:00 AM TECHNIQUE: Multiplanar multisequence MRI of the lumbar spine was performed with and without the administration of intravenous contrast. COMPARISON: Cervical and thoracic MRI today reported separately. CLINICAL HISTORY: 63 year old HIV positive female with cervical radiculopathy, infection suspected, and possible disseminated varicella infection. History of IV drug use. FINDINGS: BONES AND ALIGNMENT: Normal lumbar  segmentation, concordant with the other spinal numbering today. Relatively maintained lumbar lordosis. Mild extraconvex lumbar scoliosis. Generalized decreased T1 marrow signal in the visible spine and pelvis, similar to the other spinal levels to date. Maintained lumbar vertebral height. No lumbar vertebral body marrow edema. Visible sacrum appears intact. Conspicuous fluid in degenerated bilateral facet joints at L4-L5, and suggestion of mild/developing adjacent facet marrow edema and soft tissue inflammation with enhancement (series 6 and series 10 images 4 and 12). SPINAL CORD: Normal conus medullaris at L1. No signal abnormality in the visible lower thoracic spinal cord or conus. Normal cauda equina nerve roots. No abnormal intradural enhancement. No dural thickening identified. SOFT TISSUES: No paraspinal fluid collection. Small benign left posterior renal cyst (no follow up imaging recommended). Spleen is mostly not visible on these images. L1-L2: Negative. L2-L3: Negative. L3-L4: Mild disc desiccation, disc bulging, mild facet hypertrophy. No significant stenosis. L4-L5:  Mild circumferential disc bulge. Capacious facet joints containing fluid (series 8 image 25). With mild to moderate facet and ligamentum flavum hypertrophy. No significant stenosis. L5-S1: Negative disc. Mild facet hypertrophy. No stenosis. IMPRESSION: 1. Abnormal bilateral facets at L4-L5, with joint fluid and mild/early adjacent inflammation. While active degenerative facet joint arthritis is possible, early septic facet joint infection is not excluded. Short interval follow-up MRI in 10 to 14 days might be most valuable. 2. No other evidence of lumbar spinal infection. And otherwise Mild for age lumbar spine degeneration without significant stenosis. Electronically signed by: Helayne Hurst MD 02/25/2024 04:51 AM EST RP Workstation: [id number]   MR THORACIC SPINE W WO CONTRAST Result Date: 02/25/2024 EXAM: MRI THORACIC SPINE WITH AND  WITHOUT INTRAVENOUS CONTRAST 02/25/2024 03:56:00 AM TECHNIQUE: Multiplanar multisequence MRI of the thoracic spine was performed with and without the administration of intravenous contrast. COMPARISON: Cervical spine MRI 02/25/2024, reported separately. CLINICAL HISTORY: 63 year old HIV positive female with cervical radiculopathy, suspected infection, and possible disseminated varicella infection. FINDINGS: BONES AND ALIGNMENT: Normal alignment. Mildly exaggerated thoracic kyphosis. Thoracic spine segmentation appears to be normal. Relatively maintained thoracic vertebral body heights. Multilevel patchy and heterogeneous abnormal marrow signal from the T5 through T7 vertebral bodies. Patchy and confluent marrow edema and enhancement at these levels, superimposed on severe disc space loss at both T5-T6 and T6-T7, and suggestion of vacuum disc at both levels. No obvious disc space enhancement. No paraspinal soft tissue inflammation is associated. SPINAL CORD: Normal spinal cord volume. No convincing abnormal thoracic spinal cord signal. The conus medullaris occurs below T12. No abnormal intradural enhancement or dural thickening is identified. SOFT TISSUES: Negative visible thoracic viscera. Splenomegaly partially visible in the upper abdomen. Thoracic paraspinal soft tissues are within normal limits. DEGENERATIVE CHANGES: Capacious thoracic spinal canal at most levels. T3-T4: Disc space loss with circumferential and/or lobulated bilateral posterior disc protrusion. Mild facet hypertrophy. Borderline to mild spinal stenosis (series 16 image 13). Mild left T3 neural foraminal stenosis. No spinal cord mass effect. T4-T5: Less lobulated circumferential disc bulge or protrusion. Mild facet hypertrophy. Effaced ventral cerebrospinal fluid (CSF) space without significant spinal stenosis. Mild left T4 neural foraminal stenosis. T5-T6: Moderate disc space loss with possible vacuum disc. Circumferential disc osteophyte  complex. Mild facet hypertrophy. No spinal stenosis. Mild to moderate bilateral T5 neural foraminal stenosis. T6-T7: Advanced disc space loss with strong evidence of vacuum disc. Broad based posterior disc or disc osteophyte complex (series 13 image 13). Effaced central cerebrospinal fluid (CSF) space. Dorsal cerebrospinal fluid (CSF) space remains patent, borderline spinal stenosis. No foraminal stenosis. T7-T8: Moderate disc space loss. Circumferential disc osteophyte complex with a broad based posterior component. No stenosis. T8-T9: Better maintained disc height. Lobulated posterior disc bulge or protrusion asymmetric to the left (series 16 image 31). Mild facet hypertrophy. No spinal stenosis. Mild neural foraminal stenosis. T9-T10: Better maintained disc space but moderate sized central disc protrusion (series 13 image 12 and series 16 image 35). Effaced ventral spinal cord, dorsal cerebrospinal fluid (CSF) space remains patent. Mild spinal stenosis overall. Mild degenerative ventral cord mass effect is possible. Mild T9 neural foraminal stenosis. Mild for age degeneration at other thoracic levels. IMPRESSION: 1. Thoracic T5 through T7 abnormal marrow edema and enhancement, however, similar to the cervical exam there is severe intervening disc degeneration, and absent paraspinal soft tissue inflammation. Favor these changes are degenerative in nature. No strong evidence of thoracic spinal infection at this time. 2. Other multilevel thoracic Degeneration. Borderline to mild degenerative spinal stenosis  results, with isolated mild spinal cord mass effect at at T9T10. No convincing abnormal spinal cord signal. Occasional degenerative neural foraminal stenosis. 3. Splenomegaly partially visible. Electronically signed by: Helayne Hurst MD 02/25/2024 04:39 AM EST RP Workstation: [id number]   MR Cervical Spine W and Wo Contrast Result Date: 02/25/2024 EXAM: MRI CERVICAL SPINE WITH AND WITHOUT CONTRAST 02/25/2024  03:55:00 AM TECHNIQUE: Multiplanar multisequence MRI of the cervical spine was performed without and with the administration of intravenous contrast. COMPARISON: Report of prior cervical spine CT 08/27/2022. Image is not currently available. CLINICAL HISTORY: 63 year old HIV positive female with cervical radiculopathy, infection suspected, and possible disseminated varicella infection. History of IV drug use. FINDINGS: BONES AND ALIGNMENT: Straightening of cervical lordosis with chronic retrolisthesis of C5 on C6. Mild generalized decreased T1 background bone marrow signal. Heterogeneous marrow signal at C5-C6 with patchy STIR hyperintensity and heterogeneous enhancement. Severe disc space loss at C5-C6. Mild enhancement within the disc space at C5-C6. However, no paraspinal soft tissue inflammation. Maintained cervical vertebral height. No other marrow edema in the cervical spine. SPINAL CORD: Cervical spinal cord signal and morphology within normal limits. No abnormal intradural enhancement. No dural thickening. Negative visible cervicomedullary junction and posterior fossa. SOFT TISSUES: Preserved major vascular flow voids in the bilateral neck. Negative visible neck soft tissues. No paraspinal mass. DEGENERATIVE FINDINGS: Minimal for age cervical spine degeneration C2-C3 through C4-C5. No stenosis at those levels. C5-C6: Severe disc space loss. Asymmetric circumferential disc osteophyte complex to the right. Mild to moderate facet and ligamentum flavum hypertrophy. Mild spinal stenosis. No convincing spinal cord mass effect. Moderate to severe bilateral C6 neural foraminal stenosis. C6-C7: Disc space loss with circumferential disc osteophyte complex. Broad based left lateral recess component (series 12, image 30). Mild facet and ligamentum flavum hypertrophy. Borderline spinal stenosis. No cord mass effect. Moderate to severe bilateral C7 neural foraminal stenosis. C7-T1: Negative disc. Moderate facet  hypertrophy. No spinal stenosis. Mild to moderate bilateral C8 neural foraminal stenosis. Thoracic spine is reported separately. IMPRESSION: 1. Abnormal C5-C6 cervical level with patchy marrow edema and enhancement. But absent paraspinal soft tissue inflammation suggests this is degenerative etiology. No strong evidence of cervical spinal infection at this time. 2. Advanced disc space loss at C5-C6 with chronic retrolisthesis. And less pronounced degeneration at bothC6-C7 and C7-T1. Mild spinal stenosis, convincing spinal cord mass effect or signal abnormality. Moderate to severe degenerative bilateral C6 and C7 neural foraminal stenosis Electronically signed by: Helayne Hurst MD 02/25/2024 04:24 AM EST RP Workstation: [id number]           LOS: 0 days   Time spent= 35 mins    [place]urgess JAYSON Dare, MD [facility]  If 7PM-7AM, please contact night-coverage  02/25/2024, 10:50 AM

## 2024-02-25 NOTE — H&P (Signed)
 History and Physical    Courtney Ellis FMW:[id number] DOB: 28-Apr-1960 DOA: 02/24/2024  Patient coming from: Home.  Chief Complaint: Left lower extremity skin rash and pain.  HPI: Courtney Ellis is a 63 y.o. female with history of HIV, substance abuse, hypertension, osteoarthritis, hepatitis C treated has been experiencing left lower extremity pain for last one week.  Patient had come to the ER on February 19, 2024 with pain at the time x-rays did not show any fracture.  Patient does have severe osteoarthritis of the left hip and is being followed by Dr. Vernetta orthopedic surgeon.  Over the last 4 days patient noticed rash concerning for shingles and presents to the ER due to worsening pain.  Patient has difficulty moving the left lower extremity which has been going on for some weeks likely from osteoarthritis.  Patient also is complaining of radicular pain radiating from the low back of the spine all the way to the lower extremity.  ED Course: In the ER labs show sodium 128 creatinine 1.4 increased from recent 1.1 about a week ago.  Patient has rash on the lower back and also on the left lower extremity thigh area.  ER physician discussed with Dr. Montie Cleverly on-call infectious consultant, and started on IV acyclovir with concern for disseminated varicella-zoster.  Since patient also has radicular pain with history of substance abuse MRI of the spine was ordered which is pending.  Review of Systems: As per HPI, rest all negative.   Past Medical History:  Diagnosis Date   Anxiety    Depression    Hepatitis    C- treated   Heroin abuse (HCC)    HIV infection (HCC)    Hypertension     Past Surgical History:  Procedure Laterality Date   CESAREAN SECTION     DILATION AND CURETTAGE OF UTERUS N/A 06/27/2016   Procedure: Removal [place] Body Vagina;  Surgeon: Jennifer Ozan, DO;  Location: [facility];  Service: Gynecology;  Laterality: N/A;   I & D EXTREMITY Left 09/30/2016   Procedure:  IRRIGATION AND DEBRIDEMENT LEFT WRIST AND HAND;  Surgeon: Camella Fallow, MD;  Location: [facility];  Service: Orthopedics;  Laterality: Left;     reports that she has been smoking cigarettes. She has been exposed to tobacco smoke. She has never used smokeless tobacco. She reports that she does not currently use drugs after having used the following drugs: IV and Heroin. She reports that she does not drink alcohol.  No Known Allergies  Family History  Problem Relation Age of Onset   Hypertension Mother    Alcohol abuse Mother    Depression Mother    Diabetes Mother    Drug abuse Mother    Alcohol abuse Father    Cancer Father    Depression Father    Drug abuse Father    Depression Sister    Diabetes Sister    Drug abuse Sister    Mental illness Sister    Depression Brother    Drug abuse Brother    Mental illness Brother    Depression Maternal Aunt    Drug abuse Maternal Aunt    Depression Maternal Uncle    Drug abuse Maternal Uncle    Depression Paternal Aunt    Diabetes Paternal Aunt    Drug abuse Paternal Aunt    Stroke Paternal Aunt    Alcohol abuse Paternal Uncle    Depression Paternal Uncle    Diabetes Paternal Uncle    Drug  abuse Paternal Uncle    Kidney disease Paternal Uncle     Prior to Admission medications   Medication Sig Start Date End Date Taking? Authorizing Provider  bictegravir-emtricitabine -tenofovir  AF (BIKTARVY) 50-200-25 MG TABS tablet Take 1 tablet by mouth daily. Stop taking Triumeq  and Prezcobix . 02/15/24  Yes Waddell Alan PARAS, RPH-CPP  Ensure (ENSURE) Take 237 mLs by mouth 2 (two) times daily between meals. 01/21/23  Yes Comer, Lamar ORN, MD  methadone (DOLOPHINE) 10 MG/ML solution Take 80 mg by mouth daily.   Yes [provider]  oxyCODONE -acetaminophen  (PERCOCET/ROXICET) 5-325 MG tablet Take 1 tablet by mouth every 6 (six) hours as needed for severe pain (pain score 7-10). 02/19/24  Yes Neysa Caron PARAS, DO    Physical  Exam: Constitutional: Moderately built and nourished. Vitals:   02/24/24 1903 02/24/24 2250 02/25/24 0050  BP: 116/86 (!) 147/92 (!) 158/105  Pulse: 90 91 86  Resp: 16 16 18   Temp: 97.7 F (36.5 C) 99.6 F (37.6 C) 98.6 F (37 C)  TempSrc: Oral Oral Oral  SpO2: 100% 100% 100%   Eyes: Anicteric no pallor. ENMT: No discharge from the ears eyes nose or mouth. Neck: No mass felt.  No neck rigidity. Respiratory: No rhonchi or crepitations. Cardiovascular: S1-S2 heard. Abdomen: Soft nontender bowel sound present. Musculoskeletal: Patient has pain on moving the left lower extremity. Skin: Shingles rash on the lower back and the left thigh. Neurologic: Alert awake oriented time place and person.  Has difficulty moving left lower extremity. Psychiatric: Appears normal.  Normal affect.   Labs on Admission: I have personally reviewed following labs and imaging studies  CBC: Recent Labs  Lab 02/19/24 1418 02/24/24 1941 02/24/24 2252  WBC 5.5 8.9  --   NEUTROABS  --  4.6  --   HGB 11.7* 15.1* 13.6  HCT 37.6 47.5* 40.0  MCV 85.8 83.9  --   PLT 250 268  --    Basic Metabolic Panel: Recent Labs  Lab 02/19/24 1418 02/24/24 2247 02/24/24 2252  NA 135 128* 131*  K 4.5 4.1 5.0  CL 103 95* 98  CO2 23 23  --   GLUCOSE 106* 125* 123*  BUN 25* 52* 67*  CREATININE 1.10* 1.51* 1.70*  CALCIUM 10.4* 10.1  --    GFR: Estimated Creatinine Clearance: 28.7 mL/min (A) (by C-G formula based on SCr of 1.7 mg/dL (H)). Liver Function Tests: Recent Labs  Lab 02/24/24 2247  AST 41  ALT 19  ALKPHOS 82  BILITOT 0.9  PROT 9.4*  ALBUMIN 3.6   No results for input(s): LIPASE, AMYLASE in the last 168 hours. No results for input(s): AMMONIA in the last 168 hours. Coagulation Profile: No results for input(s): INR, PROTIME in the last 168 hours. Cardiac Enzymes: No results for input(s): CKTOTAL, CKMB, CKMBINDEX, TROPONINI in the last 168 hours. BNP (last 3 results) No  results for input(s): PROBNP in the last 8760 hours. HbA1C: No results for input(s): HGBA1C in the last 72 hours. CBG: No results for input(s): GLUCAP in the last 168 hours. Lipid Profile: No results for input(s): CHOL, HDL, LDLCALC, TRIG, CHOLHDL, LDLDIRECT in the last 72 hours. Thyroid Function Tests: No results for input(s): TSH, T4TOTAL, FREET4, T3FREE, THYROIDAB in the last 72 hours. Anemia Panel: No results for input(s): VITAMINB12, FOLATE, FERRITIN, TIBC, IRON, RETICCTPCT in the last 72 hours. Urine analysis:    Component Value Date/Time   COLORURINE YELLOW 08/12/2023 0131   APPEARANCEUR CLEAR 08/12/2023 0131   LABSPEC 1.013 08/12/2023  0131   PHURINE 6.0 08/12/2023 0131   GLUCOSEU NEGATIVE 08/12/2023 0131   GLUCOSEU NEG mg/dL 98/89/7991 7789   HGBUR NEGATIVE 08/12/2023 0131   BILIRUBINUR negative 12/04/2023 1104   KETONESUR negative 12/04/2023 1104   KETONESUR NEGATIVE 08/12/2023 0131   PROTEINUR 100 (A) 08/12/2023 0131   UROBILINOGEN 1.0 12/04/2023 1104   UROBILINOGEN 0.2 02/22/2014 1200   NITRITE Negative 12/04/2023 1104   NITRITE NEGATIVE 08/12/2023 0131   LEUKOCYTESUR Negative 12/04/2023 1104   LEUKOCYTESUR NEGATIVE 08/12/2023 0131   Sepsis Labs: @LABRCNTIP (procalcitonin:4,lacticidven:4) )No results found for this or any previous visit (from the past 240 hours).   Radiological Exams on Admission: No results found.   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 Montie Cleverly by the ER physician.  Since patient has radicular pain with history of substance abuse MRI  of the spine was ordered which is pending. Acute on chronic renal failure stage III with hyponatremia likely from poor oral intake continue with hydration follow metabolic panel. HIV last CD4 count was around 412 on 10/19/2023.  Per patient patient's medication was changed to [place] recently. Severe osteoarthritis of the left hip being followed by Dr. Vernetta orthopedic surgeon. Hypertension previously used to be on Norvasc .  Will follow blood pressure trends.  If blood pressure remains elevated will restart Norvasc . History of substance abuse presently on methadone.  Discussed with pharmacy.  They will be confirming the dose and restarting.  Since patient has features concerning for disseminated varicella with immunocompromised state will need further management and more than 2 midnight stay.   DVT prophylaxis: Lovenox  if MRI spine is negative. Code Status: Full code. Family Communication: Discussed with patient. Disposition Plan: Medical floor. Consults called: ER physician discussed with infectious disease consultant. Admission status: Patient.

## 2024-02-26 DIAGNOSIS — B027 Disseminated zoster: Secondary | ICD-10-CM | POA: Diagnosis not present

## 2024-02-26 LAB — CBC
HCT: 43.2 % (ref 36.0–46.0)
Hemoglobin: 14.2 g/dL (ref 12.0–15.0)
MCH: 27.4 pg (ref 26.0–34.0)
MCHC: 32.9 g/dL (ref 30.0–36.0)
MCV: 83.2 fL (ref 80.0–100.0)
Platelets: 260 K/uL (ref 150–400)
RBC: 5.19 MIL/uL — ABNORMAL HIGH (ref 3.87–5.11)
RDW: 14 % (ref 11.5–15.5)
WBC: 6 K/uL (ref 4.0–10.5)
nRBC: 0 % (ref 0.0–0.2)

## 2024-02-26 LAB — BASIC METABOLIC PANEL WITH GFR
Anion gap: 12 (ref 5–15)
BUN: 25 mg/dL — ABNORMAL HIGH (ref 8–23)
CO2: 21 mmol/L — ABNORMAL LOW (ref 22–32)
Calcium: 9.4 mg/dL (ref 8.9–10.3)
Chloride: 96 mmol/L — ABNORMAL LOW (ref 98–111)
Creatinine, Ser: 1.24 mg/dL — ABNORMAL HIGH (ref 0.44–1.00)
GFR, Estimated: 50 mL/min — ABNORMAL LOW (ref >60)
Glucose, Bld: 114 mg/dL — ABNORMAL HIGH (ref 70–99)
Potassium: 4.4 mmol/L (ref 3.5–5.1)
Sodium: 129 mmol/L — ABNORMAL LOW (ref 135–145)

## 2024-02-26 LAB — MAGNESIUM: Magnesium: 1.8 mg/dL (ref 1.7–2.4)

## 2024-02-26 MED ORDER — SODIUM CHLORIDE 0.9 % IV SOLN: INTRAVENOUS | Status: DC

## 2024-02-26 NOTE — Progress Notes (Signed)
 PROGRESS NOTE    Courtney Ellis  FMW:[id number] DOB: Oct 11, 1960 DOA: 02/24/2024 PCP: Pcp, No    Brief Narrative:   63 year old with history of HIV, substance abuse, HTN, OA, hepatitis C treated for left lower extremity pain about a week ago comes to the ER with concerns of possible rash concerning for shingles.  In the ER noted to have mild hyponatremia.  She also reported of back pain.  ID was called and patient was started on IV acyclovir due to concerns of disseminated varicella-zoster.  Assessment & Plan:  Varicella-zoster infection left lower extremity Back pain -Due to concerns of possible disseminated disease, on empiric IV acyclovir per ID recommendations especially in her immunocompromise state.  MRI of the cervical and thoracic spine shows chronic changes but in the lumbar spine there is concerns of possible early infection therefore will need repeat MRI in the next 10-14 days.  In the meantime we will discuss with ID to likely continue anti-infective treatment. - MRI brain negative - Mildly elevated CRP and ESR  Acute kidney injury on CKD stage IIIa Hyponatremia, hypokalemia - Baseline creatinine 1.1, admission creatinine 1.7.  Continue IV fluids  HIV - On Biktarvy  Severe osteoarthritis of the left hip - Follows outpatient orthopedic, Dr. Vernetta  Essential hypertension - Norvasc , IV as needed  History of polysubstance abuse - On methadone   DVT prophylaxis: SCDs Start: 02/25/24 0155      Code Status: Full Code Family Communication:   Status is: Inpatient Remains inpatient appropriate because: Continue hospital stay for management of disseminated VZV   PT Follow up Recs:   Subjective: No complaints Examination:  General exam: Appears calm and comfortable  Respiratory system: Clear to auscultation. Respiratory effort normal. Cardiovascular system: S1 & S2 heard, RRR. No JVD, murmurs, rubs, gallops or clicks. No pedal edema. Gastrointestinal system:  Abdomen is nondistended, soft and nontender. No organomegaly or masses felt. Normal bowel sounds heard. Central nervous system: Alert and oriented. No focal neurological deficits. Extremities: Symmetric 5 x 5 power. Skin: No rashes, lesions or ulcers Psychiatry: Judgement and insight appear normal. Mood & affect appropriate.                Diet Orders (From admission, onward)     Start     Ordered   02/25/24 0155  Diet Heart Room service appropriate? Yes; Fluid consistency: Thin  Diet effective now       Question Answer Comment  Room service appropriate? Yes   Fluid consistency: Thin      02/25/24 0155            Objective: Vitals:   02/25/24 2255 02/26/24 0224 02/26/24 0300 02/26/24 0315  BP:  (!) 154/105 (!) 149/97 (!) 161/94  Pulse:  100 (!) 102 (!) 101  Resp:  16    Temp:  98.8 F (37.1 C)    TempSrc:  Oral    SpO2:  100% 100% 100%  Weight: 59 kg     Height: 5' 3 (1.6 m)       Intake/Output Summary (Last 24 hours) at 02/26/2024 1230 Last data filed at 02/26/2024 0210 Gross per 24 hour  Intake 100 ml  Output --  Net 100 ml   Filed Weights   02/25/24 2255  Weight: 59 kg    Scheduled Meds:  amLODipine   5 mg Oral Daily   bictegravir-emtricitabine -tenofovir  AF  1 tablet Oral Daily   feeding supplement  237 mL Oral BID BM   methadone  80 mg Oral Daily   Continuous Infusions:  sodium chloride  50 mL/hr at 02/26/24 0935   acyclovir Stopped (02/26/24 0210)   lactated ringers  Stopped (02/25/24 0455)    Nutritional status     Body mass index is 23.04 kg/m.  Data Reviewed:   CBC: Recent Labs  Lab 02/19/24 1418 02/24/24 1941 02/24/24 2252 02/25/24 0455 02/26/24 0440  WBC 5.5 8.9  --  6.7 6.0  NEUTROABS  --  4.6  --   --   --   HGB 11.7* 15.1* 13.6 12.6 14.2  HCT 37.6 47.5* 40.0 37.2 43.2  MCV 85.8 83.9  --  81.4 83.2  PLT 250 268  --  248 260   Basic Metabolic Panel: Recent Labs  Lab 02/19/24 1418 02/24/24 2247 02/24/24 2252  02/25/24 0455 02/26/24 0440  NA 135 128* 131* 128* 129*  K 4.5 4.1 5.0 3.9 4.4  CL 103 95* 98 95* 96*  CO2 23 23  --  22 21*  GLUCOSE 106* 125* 123* 119* 114*  BUN 25* 52* 67* 41* 25*  CREATININE 1.10* 1.51* 1.70* 1.53* 1.24*  CALCIUM 10.4* 10.1  --  9.4 9.4  MG  --   --   --   --  1.8   GFR: Estimated Creatinine Clearance: 39.4 mL/min (A) (by C-G formula based on SCr of 1.24 mg/dL (H)). Liver Function Tests: Recent Labs  Lab 02/24/24 2247 02/25/24 0455  AST 41 36  ALT 19 20  ALKPHOS 82 65  BILITOT 0.9 0.9  PROT 9.4* 9.0*  ALBUMIN 3.6 3.0*   No results for input(s): LIPASE, AMYLASE in the last 168 hours. No results for input(s): AMMONIA in the last 168 hours. Coagulation Profile: No results for input(s): INR, PROTIME in the last 168 hours. Cardiac Enzymes: No results for input(s): CKTOTAL, CKMB, CKMBINDEX, TROPONINI in the last 168 hours. BNP (last 3 results) No results for input(s): PROBNP in the last 8760 hours. HbA1C: No results for input(s): HGBA1C in the last 72 hours. CBG: No results for input(s): GLUCAP in the last 168 hours. Lipid Profile: No results for input(s): CHOL, HDL, LDLCALC, TRIG, CHOLHDL, LDLDIRECT in the last 72 hours. Thyroid Function Tests: No results for input(s): TSH, T4TOTAL, FREET4, T3FREE, THYROIDAB in the last 72 hours. Anemia Panel: No results for input(s): VITAMINB12, FOLATE, FERRITIN, TIBC, IRON, RETICCTPCT in the last 72 hours. Sepsis Labs: No results for input(s): PROCALCITON, LATICACIDVEN in the last 168 hours.  No results found for this or any previous visit (from the past 240 hours).       Radiology Studies: MR BRAIN WO CONTRAST Result Date: 02/25/2024 EXAM: MRI BRAIN WITHOUT CONTRAST 02/25/2024 03:41:46 PM TECHNIQUE: Multiplanar multisequence MRI of the head/brain was performed without the administration of intravenous contrast. COMPARISON: CT head 08/27/2022.  CLINICAL HISTORY: Neuro deficit, acute, stroke suspected. FINDINGS: BRAIN AND VENTRICLES: There is no evidence of an acute infarct, intracranial hemorrhage, mass, midline shift, hydrocephalus, or extra-axial fluid collection. Cerebral volume is normal. T2 hyperintensities in the cerebral white matter and pons are nonspecific but compatible with mild to moderate chronic small vessel ischemic disease. 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 Major intracranial vascular flow voids are preserved. ORBITS: No acute abnormality. SINUSES AND MASTOIDS: No acute abnormality. BONES AND SOFT TISSUES: Normal marrow signal. No acute soft tissue abnormality. IMPRESSION: 1. No acute intracranial abnormality. 2. Mild to moderate chronic small vessel ischemic disease. Electronically signed by: Dasie Hamburg MD 02/25/2024 04:09 PM EST RP Workstation:  [id number]   MR Lumbar Spine W Wo Contrast Result Date: 02/25/2024 EXAM: MRI LUMBAR SPINE 02/25/2024 03:57:00 AM TECHNIQUE: Multiplanar multisequence MRI of the lumbar spine was performed with and without the administration of intravenous contrast. COMPARISON: Cervical and thoracic MRI today reported separately. CLINICAL HISTORY: 63 year old HIV positive female with cervical radiculopathy, infection suspected, and possible disseminated varicella infection. History of IV drug use. FINDINGS: BONES AND ALIGNMENT: Normal lumbar segmentation, concordant with the other spinal numbering today. Relatively maintained lumbar lordosis. Mild extraconvex lumbar scoliosis. Generalized decreased T1 marrow signal in the visible spine and pelvis, similar to the other spinal levels to date. Maintained lumbar vertebral height. No lumbar vertebral body marrow edema. Visible sacrum appears intact. Conspicuous fluid in degenerated bilateral facet joints at L4-L5, and suggestion of mild/developing adjacent facet marrow edema and soft tissue inflammation with enhancement (series 6 and  series 10 images 4 and 12). SPINAL CORD: Normal conus medullaris at L1. No signal abnormality in the visible lower thoracic spinal cord or conus. Normal cauda equina nerve roots. No abnormal intradural enhancement. No dural thickening identified. SOFT TISSUES: No paraspinal fluid collection. Small benign left posterior renal cyst (no follow up imaging recommended). Spleen is mostly not visible on these images. L1-L2: Negative. L2-L3: Negative. L3-L4: Mild disc desiccation, disc bulging, mild facet hypertrophy. No significant stenosis. L4-L5: Mild circumferential disc bulge. Capacious facet joints containing fluid (series 8 image 25). With mild to moderate facet and ligamentum flavum hypertrophy. No significant stenosis. L5-S1: Negative disc. Mild facet hypertrophy. No stenosis. IMPRESSION: 1. Abnormal bilateral facets at L4-L5, with joint fluid and mild/early adjacent inflammation. While active degenerative facet joint arthritis is possible, early septic facet joint infection is not excluded. Short interval follow-up MRI in 10 to 14 days might be most valuable. 2. No other evidence of lumbar spinal infection. And otherwise Mild for age lumbar spine degeneration without significant stenosis. Electronically signed by: Helayne Hurst MD 02/25/2024 04:51 AM EST RP Workstation: [id number]   MR THORACIC SPINE W WO CONTRAST Result Date: 02/25/2024 EXAM: MRI THORACIC SPINE WITH AND WITHOUT INTRAVENOUS CONTRAST 02/25/2024 03:56:00 AM TECHNIQUE: Multiplanar multisequence MRI of the thoracic spine was performed with and without the administration of intravenous contrast. COMPARISON: Cervical spine MRI 02/25/2024, reported separately. CLINICAL HISTORY: 63 year old HIV positive female with cervical radiculopathy, suspected infection, and possible disseminated varicella infection. FINDINGS: BONES AND ALIGNMENT: Normal alignment. Mildly exaggerated thoracic kyphosis. Thoracic spine segmentation appears to be normal. Relatively  maintained thoracic vertebral body heights. Multilevel patchy and heterogeneous abnormal marrow signal from the T5 through T7 vertebral bodies. Patchy and confluent marrow edema and enhancement at these levels, superimposed on severe disc space loss at both T5-T6 and T6-T7, and suggestion of vacuum disc at both levels. No obvious disc space enhancement. No paraspinal soft tissue inflammation is associated. SPINAL CORD: Normal spinal cord volume. No convincing abnormal thoracic spinal cord signal. The conus medullaris occurs below T12. No abnormal intradural enhancement or dural thickening is identified. SOFT TISSUES: Negative visible thoracic viscera. Splenomegaly partially visible in the upper abdomen. Thoracic paraspinal soft tissues are within normal limits. DEGENERATIVE CHANGES: Capacious thoracic spinal canal at most levels. T3-T4: Disc space loss with circumferential and/or lobulated bilateral posterior disc protrusion. Mild facet hypertrophy. Borderline to mild spinal stenosis (series 16 image 13). Mild left T3 neural foraminal stenosis. No spinal cord mass effect. T4-T5: Less lobulated circumferential disc bulge or protrusion. Mild facet hypertrophy. Effaced ventral cerebrospinal fluid (CSF) space without significant spinal stenosis. Mild left T4 neural foraminal stenosis. T5-T6:  Moderate disc space loss with possible vacuum disc. Circumferential disc osteophyte complex. Mild facet hypertrophy. No spinal stenosis. Mild to moderate bilateral T5 neural foraminal stenosis. T6-T7: Advanced disc space loss with strong evidence of vacuum disc. Broad based posterior disc or disc osteophyte complex (series 13 image 13). Effaced central cerebrospinal fluid (CSF) space. Dorsal cerebrospinal fluid (CSF) space remains patent, borderline spinal stenosis. No foraminal stenosis. T7-T8: Moderate disc space loss. Circumferential disc osteophyte complex with a broad based posterior component. No stenosis. T8-T9: Better  maintained disc height. Lobulated posterior disc bulge or protrusion asymmetric to the left (series 16 image 31). Mild facet hypertrophy. No spinal stenosis. Mild neural foraminal stenosis. T9-T10: Better maintained disc space but moderate sized central disc protrusion (series 13 image 12 and series 16 image 35). Effaced ventral spinal cord, dorsal cerebrospinal fluid (CSF) space remains patent. Mild spinal stenosis overall. Mild degenerative ventral cord mass effect is possible. Mild T9 neural foraminal stenosis. Mild for age degeneration at other thoracic levels. IMPRESSION: 1. Thoracic T5 through T7 abnormal marrow edema and enhancement, however, similar to the cervical exam there is severe intervening disc degeneration, and absent paraspinal soft tissue inflammation. Favor these changes are degenerative in nature. No strong evidence of thoracic spinal infection at this time. 2. Other multilevel thoracic Degeneration. Borderline to mild degenerative spinal stenosis results, with isolated mild spinal cord mass effect at at T9T10. No convincing abnormal spinal cord signal. Occasional degenerative neural foraminal stenosis. 3. Splenomegaly partially visible. Electronically signed by: Helayne Hurst MD 02/25/2024 04:39 AM EST RP Workstation: [id number]   MR Cervical Spine W and Wo Contrast Result Date: 02/25/2024 EXAM: MRI CERVICAL SPINE WITH AND WITHOUT CONTRAST 02/25/2024 03:55:00 AM TECHNIQUE: Multiplanar multisequence MRI of the cervical spine was performed without and with the administration of intravenous contrast. COMPARISON: Report of prior cervical spine CT 08/27/2022. Image is not currently available. CLINICAL HISTORY: 63 year old HIV positive female with cervical radiculopathy, infection suspected, and possible disseminated varicella infection. History of IV drug use. FINDINGS: BONES AND ALIGNMENT: Straightening of cervical lordosis with chronic retrolisthesis of C5 on C6. Mild generalized decreased T1  background bone marrow signal. Heterogeneous marrow signal at C5-C6 with patchy STIR hyperintensity and heterogeneous enhancement. Severe disc space loss at C5-C6. Mild enhancement within the disc space at C5-C6. However, no paraspinal soft tissue inflammation. Maintained cervical vertebral height. No other marrow edema in the cervical spine. SPINAL CORD: Cervical spinal cord signal and morphology within normal limits. No abnormal intradural enhancement. No dural thickening. Negative visible cervicomedullary junction and posterior fossa. SOFT TISSUES: Preserved major vascular flow voids in the bilateral neck. Negative visible neck soft tissues. No paraspinal mass. DEGENERATIVE FINDINGS: Minimal for age cervical spine degeneration C2-C3 through C4-C5. No stenosis at those levels. C5-C6: Severe disc space loss. Asymmetric circumferential disc osteophyte complex to the right. Mild to moderate facet and ligamentum flavum hypertrophy. Mild spinal stenosis. No convincing spinal cord mass effect. Moderate to severe bilateral C6 neural foraminal stenosis. C6-C7: Disc space loss with circumferential disc osteophyte complex. Broad based left lateral recess component (series 12, image 30). Mild facet and ligamentum flavum hypertrophy. Borderline spinal stenosis. No cord mass effect. Moderate to severe bilateral C7 neural foraminal stenosis. C7-T1: Negative disc. Moderate facet hypertrophy. No spinal stenosis. Mild to moderate bilateral C8 neural foraminal stenosis. Thoracic spine is reported separately. IMPRESSION: 1. Abnormal C5-C6 cervical level with patchy marrow edema and enhancement. But absent paraspinal soft tissue inflammation suggests this is degenerative etiology. No strong evidence of cervical spinal infection  at this time. 2. Advanced disc space loss at C5-C6 with chronic retrolisthesis. And less pronounced degeneration at bothC6-C7 and C7-T1. Mild spinal stenosis, convincing spinal cord mass effect or signal  abnormality. Moderate to severe degenerative bilateral C6 and C7 neural foraminal stenosis Electronically signed by: Helayne Hurst MD 02/25/2024 04:24 AM EST RP Workstation: [id number]           LOS: 1 day   Time spent= 35 mins    Burgess JAYSON Dare, MD [facility]  If 7PM-7AM, please contact night-coverage  02/26/2024, 12:30 PM

## 2024-02-26 NOTE — Plan of Care (Signed)
 Patient arrived from the unit, AOX 4, on room air. No complaints of pain. Skin assessment done. Call bell within reached. Refused bed alarm, aware of risks and benefits. Problem: Education: Goal: Knowledge of General Education information will improve Description: Including pain rating scale, medication(s)/side effects and non-pharmacologic comfort measures Outcome: Progressing   Problem: Health Behavior/Discharge Planning: Goal: Ability to manage health-related needs will improve Outcome: Progressing   Problem: Clinical Measurements: Goal: Ability to maintain clinical measurements within normal limits will improve Outcome: Progressing Goal: Will remain free from infection Outcome: Progressing Goal: Diagnostic test results will improve Outcome: Progressing Goal: Respiratory complications will improve Outcome: Progressing Goal: Cardiovascular complication will be avoided Outcome: Progressing   Problem: Activity: Goal: Risk for activity intolerance will decrease Outcome: Progressing   Problem: Nutrition: Goal: Adequate nutrition will be maintained Outcome: Progressing   Problem: Coping: Goal: Level of anxiety will decrease Outcome: Progressing   Problem: Elimination: Goal: Will not experience complications related to bowel motility Outcome: Progressing Goal: Will not experience complications related to urinary retention Outcome: Progressing   Problem: Pain Managment: Goal: General experience of comfort will improve and/or be controlled Outcome: Progressing   Problem: Safety: Goal: Ability to remain free from injury will improve Outcome: Progressing   Problem: Skin Integrity: Goal: Risk for impaired skin integrity will decrease Outcome: Progressing

## 2024-02-27 DIAGNOSIS — B027 Disseminated zoster: Secondary | ICD-10-CM | POA: Diagnosis not present

## 2024-02-27 LAB — BASIC METABOLIC PANEL WITH GFR
Anion gap: 8 (ref 5–15)
BUN: 28 mg/dL — ABNORMAL HIGH (ref 8–23)
CO2: 24 mmol/L (ref 22–32)
Calcium: 9 mg/dL (ref 8.9–10.3)
Chloride: 97 mmol/L — ABNORMAL LOW (ref 98–111)
Creatinine, Ser: 1.17 mg/dL — ABNORMAL HIGH (ref 0.44–1.00)
GFR, Estimated: 53 mL/min — ABNORMAL LOW (ref >60)
Glucose, Bld: 98 mg/dL (ref 70–99)
Potassium: 3.8 mmol/L (ref 3.5–5.1)
Sodium: 129 mmol/L — ABNORMAL LOW (ref 135–145)

## 2024-02-27 NOTE — Plan of Care (Signed)
   Problem: Education: Goal: Knowledge of General Education information will improve Description: Including pain rating scale, medication(s)/side effects and non-pharmacologic comfort measures Outcome: Progressing   Problem: Health Behavior/Discharge Planning: Goal: Ability to manage health-related needs will improve Outcome: Progressing   Problem: Clinical Measurements: Goal: Will remain free from infection Outcome: Progressing   Problem: Activity: Goal: Risk for activity intolerance will decrease Outcome: Progressing   Problem: Nutrition: Goal: Adequate nutrition will be maintained Outcome: Progressing   Problem: Coping: Goal: Level of anxiety will decrease Outcome: Progressing

## 2024-02-27 NOTE — Progress Notes (Signed)
 ID PROGRESS NOTE  Remains afebrile Part of rash starting to crust over   HIV disease with disseminated zoster  Continue on IV acyclovir, can stop once rash appears predominantly crusted over then change over to oral valtrex. Anticipate to treat for 14 day of treatment  Abnormal spine MRI- to plan to repeat imaging in 1-12 days to see if this is early discitis vs. Insignificant finding  Montie B. Luiz MD MPH [facility] [phone]

## 2024-02-27 NOTE — Progress Notes (Signed)
 PROGRESS NOTE    Courtney Ellis  FMW:[id number] DOB: Sep 10, 1960 DOA: 02/24/2024 PCP: Pcp, No    Brief Narrative:   63 year old with history of HIV, substance abuse, HTN, OA, hepatitis C treated for left lower extremity pain about a week ago comes to the ER with concerns of possible rash concerning for shingles.  In the ER noted to have mild hyponatremia.  She also reported of back pain.  ID was called and patient was started on IV acyclovir due to concerns of disseminated varicella-zoster.  Assessment & Plan:  Varicella-zoster infection left lower extremity Back pain -Due to concerns of possible disseminated disease, on empiric IV acyclovir per ID recommendations especially in her immunocompromise state.  MRI of the cervical and thoracic spine shows chronic changes but in the lumbar spine there is concerns of possible early infection therefore will need repeat MRI in the next 10-14 days.  ID team is following - MRI brain negative - Mildly elevated CRP and ESR  Acute kidney injury on CKD stage IIIa Hyponatremia, hypokalemia - Baseline creatinine 1.1, admission creatinine 1.7.  Continue IV fluids  HIV - On Biktarvy  Severe osteoarthritis of the left hip - Follows outpatient orthopedic, Dr. Vernetta  Essential hypertension - Norvasc , IV as needed  History of polysubstance abuse - On methadone   DVT prophylaxis: SCDs Start: 02/25/24 0155      Code Status: Full Code Family Communication:   Status is: Inpatient Remains inpatient appropriate because: Continue hospital stay for management of disseminated VZV   PT Follow up Recs:   Subjective: No complaints.  Tells me her left thigh lesion seems to be crusting over. Examination:  General exam: Appears calm and comfortable  Respiratory system: Clear to auscultation. Respiratory effort normal. Cardiovascular system: S1 & S2 heard, RRR. No JVD, murmurs, rubs, gallops or clicks. No pedal edema. Gastrointestinal system:  Abdomen is nondistended, soft and nontender. No organomegaly or masses felt. Normal bowel sounds heard. Central nervous system: Alert and oriented. No focal neurological deficits. Extremities: Symmetric 5 x 5 power. Skin: No rashes, lesions or ulcers Psychiatry: Judgement and insight appear normal. Mood & affect appropriate.                Diet Orders (From admission, onward)     Start     Ordered   02/25/24 0155  Diet Heart Room service appropriate? Yes; Fluid consistency: Thin  Diet effective now       Question Answer Comment  Room service appropriate? Yes   Fluid consistency: Thin      02/25/24 0155            Objective: Vitals:   02/26/24 1428 02/26/24 1829 02/26/24 2056 02/27/24 0701  BP: (!) 111/93 130/84 136/86 (!) 159/89  Pulse: 99 98 88 78  Resp: 18 18 18 16   Temp: 98.1 F (36.7 C) (!) 97.5 F (36.4 C) 98.9 F (37.2 C) 98.3 F (36.8 C)  TempSrc: Oral Oral Oral Oral  SpO2: 100% 100% 100% 100%  Weight:      Height:        Intake/Output Summary (Last 24 hours) at 02/27/2024 1007 Last data filed at 02/27/2024 0156 Gross per 24 hour  Intake 573.6 ml  Output --  Net 573.6 ml   Filed Weights   02/25/24 2255  Weight: 59 kg    Scheduled Meds:  amLODipine   5 mg Oral Daily   bictegravir-emtricitabine -tenofovir  AF  1 tablet Oral Daily   feeding supplement  237 mL Oral  BID BM   methadone  80 mg Oral Daily   Continuous Infusions:  sodium chloride  50 mL/hr at 02/26/24 0935   acyclovir 590 mg (02/27/24 0425)   lactated ringers  Stopped (02/25/24 0455)    Nutritional status     Body mass index is 23.04 kg/m.  Data Reviewed:   CBC: Recent Labs  Lab 02/24/24 1941 02/24/24 2252 02/25/24 0455 02/26/24 0440  WBC 8.9  --  6.7 6.0  NEUTROABS 4.6  --   --   --   HGB 15.1* 13.6 12.6 14.2  HCT 47.5* 40.0 37.2 43.2  MCV 83.9  --  81.4 83.2  PLT 268  --  248 260   Basic Metabolic Panel: Recent Labs  Lab 02/24/24 2247 02/24/24 2252  02/25/24 0455 02/26/24 0440 02/27/24 0527  NA 128* 131* 128* 129* 129*  K 4.1 5.0 3.9 4.4 3.8  CL 95* 98 95* 96* 97*  CO2 23  --  22 21* 24  GLUCOSE 125* 123* 119* 114* 98  BUN 52* 67* 41* 25* 28*  CREATININE 1.51* 1.70* 1.53* 1.24* 1.17*  CALCIUM 10.1  --  9.4 9.4 9.0  MG  --   --   --  1.8  --    GFR: Estimated Creatinine Clearance: 41.8 mL/min (A) (by C-G formula based on SCr of 1.17 mg/dL (H)). Liver Function Tests: Recent Labs  Lab 02/24/24 2247 02/25/24 0455  AST 41 36  ALT 19 20  ALKPHOS 82 65  BILITOT 0.9 0.9  PROT 9.4* 9.0*  ALBUMIN 3.6 3.0*   No results for input(s): LIPASE, AMYLASE in the last 168 hours. No results for input(s): AMMONIA in the last 168 hours. Coagulation Profile: No results for input(s): INR, PROTIME in the last 168 hours. Cardiac Enzymes: No results for input(s): CKTOTAL, CKMB, CKMBINDEX, TROPONINI in the last 168 hours. BNP (last 3 results) No results for input(s): PROBNP in the last 8760 hours. HbA1C: No results for input(s): HGBA1C in the last 72 hours. CBG: No results for input(s): GLUCAP in the last 168 hours. Lipid Profile: No results for input(s): CHOL, HDL, LDLCALC, TRIG, CHOLHDL, LDLDIRECT in the last 72 hours. Thyroid Function Tests: No results for input(s): TSH, T4TOTAL, FREET4, T3FREE, THYROIDAB in the last 72 hours. Anemia Panel: No results for input(s): VITAMINB12, FOLATE, FERRITIN, TIBC, IRON, RETICCTPCT in the last 72 hours. Sepsis Labs: No results for input(s): PROCALCITON, LATICACIDVEN in the last 168 hours.  No results found for this or any previous visit (from the past 240 hours).       Radiology Studies: MR BRAIN WO CONTRAST Result Date: 02/25/2024 EXAM: MRI BRAIN WITHOUT CONTRAST 02/25/2024 03:41:46 PM TECHNIQUE: Multiplanar multisequence MRI of the head/brain was performed without the administration of intravenous contrast. COMPARISON: CT head  08/27/2022. CLINICAL HISTORY: Neuro deficit, acute, stroke suspected. FINDINGS: BRAIN AND VENTRICLES: There is no evidence of an acute infarct, intracranial hemorrhage, mass, midline shift, hydrocephalus, or extra-axial fluid collection. Cerebral volume is normal. T2 hyperintensities in the cerebral white matter and pons are nonspecific but compatible with mild to moderate chronic small vessel ischemic disease. 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 Major intracranial vascular flow voids are preserved. ORBITS: No acute abnormality. SINUSES AND MASTOIDS: No acute abnormality. BONES AND SOFT TISSUES: Normal marrow signal. No acute soft tissue abnormality. IMPRESSION: 1. No acute intracranial abnormality. 2. Mild to moderate chronic small vessel ischemic disease. Electronically signed by: Dasie Hamburg MD 02/25/2024 04:09 PM EST RP Workstation: [id number]  LOS: 2 days   Time spent= 35 mins    Burgess JAYSON Dare, MD [facility]  If 7PM-7AM, please contact night-coverage  02/27/2024, 10:07 AM

## 2024-02-28 DIAGNOSIS — B027 Disseminated zoster: Secondary | ICD-10-CM | POA: Diagnosis not present

## 2024-02-28 LAB — BASIC METABOLIC PANEL WITH GFR
Anion gap: 11 (ref 5–15)
BUN: 20 mg/dL (ref 8–23)
CO2: 21 mmol/L — ABNORMAL LOW (ref 22–32)
Calcium: 8.7 mg/dL — ABNORMAL LOW (ref 8.9–10.3)
Chloride: 98 mmol/L (ref 98–111)
Creatinine, Ser: 0.95 mg/dL (ref 0.44–1.00)
GFR, Estimated: >60 mL/min (ref >60)
Glucose, Bld: 88 mg/dL (ref 70–99)
Potassium: 4.3 mmol/L (ref 3.5–5.1)
Sodium: 130 mmol/L — ABNORMAL LOW (ref 135–145)

## 2024-02-28 MED ORDER — SODIUM CHLORIDE 0.9 % IV SOLN: INTRAVENOUS | Status: DC

## 2024-02-28 NOTE — Progress Notes (Signed)
 PROGRESS NOTE    Courtney Ellis  FMW:[id number] DOB: May 23, 1960 DOA: 02/24/2024 PCP: Pcp, No    Brief Narrative:   63 year old with history of HIV, substance abuse, HTN, OA, hepatitis C treated for left lower extremity pain about a week ago comes to the ER with concerns of possible rash concerning for shingles.  In the ER noted to have mild hyponatremia.  She also reported of back pain.  ID was called and patient was started on IV acyclovir due to concerns of disseminated varicella-zoster.  Assessment & Plan:  Varicella-zoster infection left lower extremity Back pain -Due to concerns of possible disseminated disease, on empiric IV acyclovir per ID recommendations especially in her immunocompromise state.  MRI of the cervical and thoracic spine shows chronic changes but in the lumbar spine there is concerns of possible early infection therefore will need repeat MRI in the next 10-14 days.  ID team is following - MRI brain negative - Mildly elevated CRP and ESR  Acute kidney injury on CKD stage IIIa; improved.  Hyponatremia, hypokalemia - Baseline creatinine 1.1, admission creatinine 1.7.  Improved with IVF  HIV - On Biktarvy  Severe osteoarthritis of the left hip - Follows outpatient orthopedic, Dr. Vernetta  Essential hypertension - Norvasc , IV as needed  History of polysubstance abuse - On methadone   DVT prophylaxis: SCDs Start: 02/25/24 0155      Code Status: Full Code Family Communication:   Status is: Inpatient Remains inpatient appropriate because: Continue hospital stay for management of disseminated VZV   PT Follow up Recs:   Subjective: Sleeping when I walked in but does not have any new complaints at this time. Examination:  General exam: Appears calm and comfortable  Respiratory system: Clear to auscultation. Respiratory effort normal. Cardiovascular system: S1 & S2 heard, RRR. No JVD, murmurs, rubs, gallops or clicks. No pedal edema. Gastrointestinal  system: Abdomen is nondistended, soft and nontender. No organomegaly or masses felt. Normal bowel sounds heard. Central nervous system: Alert and oriented. No focal neurological deficits. Extremities: Symmetric 5 x 5 power. Skin: Left thigh and coccyx area vesicular lesion noted Psychiatry: Judgement and insight appear normal. Mood & affect appropriate.                Diet Orders (From admission, onward)     Start     Ordered   02/25/24 0155  Diet Heart Room service appropriate? Yes; Fluid consistency: Thin  Diet effective now       Question Answer Comment  Room service appropriate? Yes   Fluid consistency: Thin      02/25/24 0155            Objective: Vitals:   02/27/24 2102 02/28/24 0206 02/28/24 0351 02/28/24 1003  BP: (!) 153/93 138/79 135/87 (!) 154/84  Pulse: 73 70 73 76  Resp: 16  17 16   Temp: 98.5 F (36.9 C)  98.1 F (36.7 C) 98.2 F (36.8 C)  TempSrc: Oral  Oral Oral  SpO2: 100% 100% 100% 99%  Weight:      Height:        Intake/Output Summary (Last 24 hours) at 02/28/2024 1048 Last data filed at 02/28/2024 0400 Gross per 24 hour  Intake 360 ml  Output --  Net 360 ml   Filed Weights   02/25/24 2255  Weight: 59 kg    Scheduled Meds:  amLODipine   5 mg Oral Daily   bictegravir-emtricitabine -tenofovir  AF  1 tablet Oral Daily   feeding supplement  237  mL Oral BID BM   methadone  80 mg Oral Daily   Continuous Infusions:  sodium chloride  50 mL/hr at 02/28/24 0915   acyclovir 590 mg (02/28/24 0534)    Nutritional status     Body mass index is 23.04 kg/m.  Data Reviewed:   CBC: Recent Labs  Lab 02/24/24 1941 02/24/24 2252 02/25/24 0455 02/26/24 0440  WBC 8.9  --  6.7 6.0  NEUTROABS 4.6  --   --   --   HGB 15.1* 13.6 12.6 14.2  HCT 47.5* 40.0 37.2 43.2  MCV 83.9  --  81.4 83.2  PLT 268  --  248 260   Basic Metabolic Panel: Recent Labs  Lab 02/24/24 2247 02/24/24 2252 02/25/24 0455 02/26/24 0440 02/27/24 0527  02/28/24 0524  NA 128* 131* 128* 129* 129* 130*  K 4.1 5.0 3.9 4.4 3.8 4.3  CL 95* 98 95* 96* 97* 98  CO2 23  --  22 21* 24 21*  GLUCOSE 125* 123* 119* 114* 98 88  BUN 52* 67* 41* 25* 28* 20  CREATININE 1.51* 1.70* 1.53* 1.24* 1.17* 0.95  CALCIUM 10.1  --  9.4 9.4 9.0 8.7*  MG  --   --   --  1.8  --   --    GFR: Estimated Creatinine Clearance: 51.4 mL/min (by C-G formula based on SCr of 0.95 mg/dL). Liver Function Tests: Recent Labs  Lab 02/24/24 2247 02/25/24 0455  AST 41 36  ALT 19 20  ALKPHOS 82 65  BILITOT 0.9 0.9  PROT 9.4* 9.0*  ALBUMIN 3.6 3.0*   No results for input(s): LIPASE, AMYLASE in the last 168 hours. No results for input(s): AMMONIA in the last 168 hours. Coagulation Profile: No results for input(s): INR, PROTIME in the last 168 hours. Cardiac Enzymes: No results for input(s): CKTOTAL, CKMB, CKMBINDEX, TROPONINI in the last 168 hours. BNP (last 3 results) No results for input(s): PROBNP in the last 8760 hours. HbA1C: No results for input(s): HGBA1C in the last 72 hours. CBG: No results for input(s): GLUCAP in the last 168 hours. Lipid Profile: No results for input(s): CHOL, HDL, LDLCALC, TRIG, CHOLHDL, LDLDIRECT in the last 72 hours. Thyroid Function Tests: No results for input(s): TSH, T4TOTAL, FREET4, T3FREE, THYROIDAB in the last 72 hours. Anemia Panel: No results for input(s): VITAMINB12, FOLATE, FERRITIN, TIBC, IRON, RETICCTPCT in the last 72 hours. Sepsis Labs: No results for input(s): PROCALCITON, LATICACIDVEN in the last 168 hours.  No results found for this or any previous visit (from the past 240 hours).       Radiology Studies: No results found.         LOS: 3 days   Time spent= 35 mins    Burgess JAYSON Dare, MD [facility]  If 7PM-7AM, please contact night-coverage  02/28/2024, 10:48 AM

## 2024-02-28 NOTE — Plan of Care (Signed)

## 2024-02-28 NOTE — Plan of Care (Signed)
  Problem: Education: Goal: Knowledge of General Education information will improve Description: Including pain rating scale, medication(s)/side effects and non-pharmacologic comfort measures 02/28/2024 1926 by Kieran Nachtigal K, RN Outcome: Progressing 02/28/2024 1926 by Mikya Don K, RN Outcome: Progressing   Problem: Health Behavior/Discharge Planning: Goal: Ability to manage health-related needs will improve 02/28/2024 1926 by Andres Vest K, RN Outcome: Progressing 02/28/2024 1926 by Kamia Insalaco K, RN Outcome: Progressing   Problem: Clinical Measurements: Goal: Ability to maintain clinical measurements within normal limits will improve 02/28/2024 1926 by Elgin Carn K, RN Outcome: Progressing 02/28/2024 1926 by Gyanna Jarema K, RN Outcome: Progressing Goal: Will remain free from infection 02/28/2024 1926 by Quinterius Gaida K, RN Outcome: Progressing 02/28/2024 1926 by Merlin Golden K, RN Outcome: Progressing Goal: Diagnostic test results will improve 02/28/2024 1926 by Lavonda Thal K, RN Outcome: Progressing 02/28/2024 1926 by Juliett Eastburn K, RN Outcome: Progressing Goal: Respiratory complications will improve 02/28/2024 1926 by Kaleb Sek K, RN Outcome: Progressing 02/28/2024 1926 by Ameer Sanden K, RN Outcome: Progressing Goal: Cardiovascular complication will be avoided 02/28/2024 1926 by Peola Joynt, Shakeitha Umbaugh K, RN Outcome: Progressing 02/28/2024 1926 by Avrianna Smart K, RN Outcome: Progressing   Problem: Activity: Goal: Risk for activity intolerance will decrease 02/28/2024 1926 by Florian Dellis POUR, RN Outcome: Progressing 02/28/2024 1926 by Florian Dellis POUR, RN Outcome: Progressing   Problem: Nutrition: Goal: Adequate nutrition will be maintained 02/28/2024 1926 by Florian Dellis POUR, RN Outcome: Progressing 02/28/2024 1926 by Florian Dellis POUR, RN Outcome: Progressing   Problem: Coping: Goal: Level of anxiety will decrease 02/28/2024 1926 by ,   K, RN Outcome: Progressing 02/28/2024 1926 by Andrell Tallman K, RN Outcome: Progressing   Problem: Elimination: Goal: Will not experience complications related to bowel motility 02/28/2024 1926 by Andrzej Scully K, RN Outcome: Progressing 02/28/2024 1926 by Mesiah Manzo K, RN Outcome: Progressing Goal: Will not experience complications related to urinary retention 02/28/2024 1926 by Reid Regas K, RN Outcome: Progressing 02/28/2024 1926 by Merle Whitehorn K, RN Outcome: Progressing   Problem: Pain Managment: Goal: General experience of comfort will improve and/or be controlled 02/28/2024 1926 by Corry Ihnen K, RN Outcome: Progressing 02/28/2024 1926 by Jimel Myler K, RN Outcome: Progressing   Problem: Safety: Goal: Ability to remain free from injury will improve 02/28/2024 1926 by Falicia Lizotte K, RN Outcome: Progressing 02/28/2024 1926 by Annalise Mcdiarmid K, RN Outcome: Progressing   Problem: Skin Integrity: Goal: Risk for impaired skin integrity will decrease 02/28/2024 1926 by Corby Vandenberghe K, RN Outcome: Progressing 02/28/2024 1926 by Aleira Deiter K, RN Outcome: Progressing

## 2024-02-28 NOTE — Plan of Care (Signed)
  Problem: Education: Goal: Knowledge of General Education information will improve Description: Including pain rating scale, medication(s)/side effects and non-pharmacologic comfort measures Outcome: Progressing   Problem: Pain Managment: Goal: General experience of comfort will improve and/or be controlled Outcome: Progressing   Problem: Skin Integrity: Goal: Risk for impaired skin integrity will decrease Outcome: Progressing

## 2024-02-28 NOTE — Evaluation (Signed)
 Physical Therapy Evaluation and Discharge Patient Details Name: Courtney Ellis MRN: [id number] DOB: 10-07-1960 Today's Date: 02/28/2024  History of Present Illness  63 y.o. female has been experiencing left lower extremity pain for last one week. Presented 02/25/24. +varicella-zoster infection of LLE; MRI spine ?early infection in lumbar spine (MRI to be repeated); PMH- HIV, substance abuse, hypertension, osteoarthritis (severe left hip), hepatitis C treated, PSA  Clinical Impression   Patient evaluated by Physical Therapy with no further acute PT needs identified.  PT is signing off. Thank you for this referral.         If plan is discharge home, recommend the following:     Can travel by private vehicle        Equipment Recommendations None recommended by PT  Recommendations for Other Services       Functional Status Assessment Patient has not had a recent decline in their functional status     Precautions / Restrictions Precautions Precautions: None Restrictions Weight Bearing Restrictions Per Provider Order: No      Mobility  Bed Mobility Overal bed mobility: Independent                  Transfers Overall transfer level: Independent Equipment used: None                    Ambulation/Gait Ambulation/Gait assistance: Independent, Modified independent (Device/Increase time) Gait Distance (Feet): 120 Feet Assistive device: IV Pole, None Gait Pattern/deviations: Step-through pattern, Decreased stride length       General Gait Details: no antalgic pattern; can walk witout UE support and can also manage IV pole herself  Stairs Stairs:  (deferred due to airborne precautions and clinical judgment that pt will not have difficulty)          Wheelchair Mobility     Tilt Bed    Modified Rankin (Stroke Patients Only)       Balance Overall balance assessment: Independent                                            Pertinent Vitals/Pain Pain Assessment Pain Assessment: Faces Faces Pain Scale: Hurts little more Pain Location: Left LE Pain Descriptors / Indicators: Burning, Discomfort Pain Intervention(s): Limited activity within patient's tolerance, Monitored during session    Home Living Family/patient expects to be discharged to:: Private residence Living Arrangements: Children Available Help at Discharge: Family;Available PRN/intermittently Type of Home: House Home Access: Stairs to enter Entrance Stairs-Rails: Right Entrance Stairs-Number of Steps: 5   Home Layout: One level Home Equipment: None      Prior Function Prior Level of Function : Independent/Modified Independent                     Extremity/Trunk Assessment   Upper Extremity Assessment Upper Extremity Assessment: Overall WFL for tasks assessed    Lower Extremity Assessment Lower Extremity Assessment: Overall WFL for tasks assessed    Cervical / Trunk Assessment Cervical / Trunk Assessment: Normal  Communication   Communication Communication: No apparent difficulties    Cognition Arousal: Alert Behavior During Therapy: WFL for tasks assessed/performed   PT - Cognitive impairments: No apparent impairments                         Following commands: Intact       Cueing  Cueing Techniques: Verbal cues     General Comments      Exercises     Assessment/Plan    PT Assessment Patient does not need any further PT services  PT Problem List         PT Treatment Interventions      PT Goals (Current goals can be found in the Care Plan section)  Acute Rehab PT Goals Patient Stated Goal: go home soon PT Goal Formulation: All assessment and education complete, DC therapy    Frequency       Co-evaluation               AM-PAC PT 6 Clicks Mobility  Outcome Measure Help needed turning from your back to your side while in a flat bed without using bedrails?: None Help  needed moving from lying on your back to sitting on the side of a flat bed without using bedrails?: None Help needed moving to and from a bed to a chair (including a wheelchair)?: None Help needed standing up from a chair using your arms (e.g., wheelchair or bedside chair)?: None Help needed to walk in hospital room?: None Help needed climbing 3-5 steps with a railing? : None 6 Click Score: 24    End of Session   Activity Tolerance: Patient tolerated treatment well Patient left: Other (comment) (walking in room)   PT Visit Diagnosis: Other abnormalities of gait and mobility (R26.89)    Time: 8698-8676 PT Time Calculation (min) (ACUTE ONLY): 22 min   Charges:   PT Evaluation $PT Eval Low Complexity: 1 Low   PT General Charges $$ ACUTE PT VISIT: 1 Visit          Macario RAMAN, PT Acute Rehabilitation Services  Office [phone]   Macario SHAUNNA Soja 02/28/2024, 2:07 PM

## 2024-02-28 NOTE — Progress Notes (Signed)
 OT Cancellation Note  Patient Details Name: Courtney Ellis MRN: [id number] DOB: 1960-05-25   Cancelled Treatment:    Reason Eval/Treat Not Completed: Per PT Pt is independent. OT screened, no needs identified, will sign off  Leita JINNY Odea 02/28/2024, 2:16 PM  Leita DEL OTR/L Acute Rehabilitation Services Office: [phone]

## 2024-02-29 DIAGNOSIS — B027 Disseminated zoster: Secondary | ICD-10-CM | POA: Diagnosis not present

## 2024-02-29 DIAGNOSIS — B019 Varicella without complication: Principal | ICD-10-CM

## 2024-02-29 LAB — BASIC METABOLIC PANEL WITH GFR
Anion gap: 8 (ref 5–15)
BUN: 14 mg/dL (ref 8–23)
CO2: 27 mmol/L (ref 22–32)
Calcium: 9.2 mg/dL (ref 8.9–10.3)
Chloride: 97 mmol/L — ABNORMAL LOW (ref 98–111)
Creatinine, Ser: 0.93 mg/dL (ref 0.44–1.00)
GFR, Estimated: >60 mL/min (ref >60)
Glucose, Bld: 117 mg/dL — ABNORMAL HIGH (ref 70–99)
Potassium: 4.7 mmol/L (ref 3.5–5.1)
Sodium: 132 mmol/L — ABNORMAL LOW (ref 135–145)

## 2024-02-29 LAB — CBC
HCT: 32.5 % — ABNORMAL LOW (ref 36.0–46.0)
Hemoglobin: 10.8 g/dL — ABNORMAL LOW (ref 12.0–15.0)
MCH: 27.8 pg (ref 26.0–34.0)
MCHC: 33.2 g/dL (ref 30.0–36.0)
MCV: 83.5 fL (ref 80.0–100.0)
Platelets: 248 K/uL (ref 150–400)
RBC: 3.89 MIL/uL (ref 3.87–5.11)
RDW: 14.2 % (ref 11.5–15.5)
WBC: 6.8 K/uL (ref 4.0–10.5)
nRBC: 0 % (ref 0.0–0.2)

## 2024-02-29 LAB — MAGNESIUM: Magnesium: 1.6 mg/dL — ABNORMAL LOW (ref 1.7–2.4)

## 2024-02-29 MED ORDER — MAGNESIUM OXIDE -MG SUPPLEMENT 400 (240 MG) MG PO TABS
800.0000 mg | ORAL_TABLET | Freq: Once | ORAL | Status: AC
  Administered 2024-02-29: 800 mg via ORAL
  Filled 2024-02-29: qty 2

## 2024-02-29 MED ORDER — POLYETHYLENE GLYCOL 3350 17 G PO PACK: 17.0000 g | PACK | Freq: Every day | ORAL | Status: DC | PRN

## 2024-02-29 MED ORDER — TRAMADOL HCL 50 MG PO TABS
50.0000 mg | ORAL_TABLET | Freq: Once | ORAL | Status: AC
  Administered 2024-02-29: 50 mg via ORAL
  Filled 2024-02-29: qty 1

## 2024-02-29 MED ORDER — GABAPENTIN 300 MG PO CAPS
300.0000 mg | ORAL_CAPSULE | Freq: Once | ORAL | Status: AC
  Administered 2024-02-29: 300 mg via ORAL
  Filled 2024-02-29: qty 1

## 2024-02-29 MED ORDER — DOCUSATE SODIUM 100 MG PO CAPS
100.0000 mg | ORAL_CAPSULE | Freq: Two times a day (BID) | ORAL | Status: DC
  Administered 2024-02-29 – 2024-03-03 (×6): 100 mg via ORAL
  Filled 2024-02-29 (×9): qty 1

## 2024-02-29 MED ORDER — MAGNESIUM SULFATE 4 GM/100ML IV SOLN
4.0000 g | Freq: Once | INTRAVENOUS | Status: AC
  Administered 2024-02-29: 4 g via INTRAVENOUS
  Filled 2024-02-29: qty 100

## 2024-02-29 MED ORDER — DEXTROSE 5 % IV SOLN
10.0000 mg/kg | Freq: Three times a day (TID) | INTRAVENOUS | Status: DC
  Administered 2024-02-29 – 2024-03-04 (×13): 525 mg via INTRAVENOUS
  Filled 2024-02-29 (×16): qty 10.5

## 2024-02-29 NOTE — Progress Notes (Signed)
 PROGRESS NOTE    Courtney Ellis  FMW:[id number] DOB: 1960-07-19 DOA: 02/24/2024 PCP: Pcp, No    Brief Narrative:   63 year old with history of HIV, substance abuse, HTN, OA, hepatitis C treated for left lower extremity pain about a week ago comes to the ER with concerns of possible rash concerning for shingles.  In the ER noted to have mild hyponatremia.  She also reported of back pain.  ID was called and patient was started on IV acyclovir due to concerns of disseminated varicella-zoster.  Assessment & Plan:  Varicella-zoster infection left lower extremity Back pain -Due to concerns of possible disseminated disease, on empiric IV acyclovir per ID recommendations especially in her immunocompromise state.  MRI of the cervical and thoracic spine shows chronic changes but in the lumbar spine there is concerns of possible early infection therefore will need repeat MRI in the next 10-14 days.  ID team is following - MRI brain negative - Mildly elevated CRP and ESR  Acute kidney injury on CKD stage IIIa; improved.  Hyponatremia, hypokalemia - Baseline creatinine 1.1, admission creatinine 1.7.  Resolved with IV fluids  HIV - On Biktarvy  Severe osteoarthritis of the left hip - Follows outpatient orthopedic, Dr. Vernetta  Essential hypertension - Norvasc , IV as needed  History of polysubstance abuse - On methadone  Hypomagnesemia - As needed repletion   DVT prophylaxis: SCDs Start: 02/25/24 0155      Code Status: Full Code Family Communication:   Status is: Inpatient Remains inpatient appropriate because: Continue hospital stay for management of disseminated VZV   PT Follow up Recs:   Subjective: No new complaints Examination:  General exam: Appears calm and comfortable  Respiratory system: Clear to auscultation. Respiratory effort normal. Cardiovascular system: S1 & S2 heard, RRR. No JVD, murmurs, rubs, gallops or clicks. No pedal edema. Gastrointestinal system:  Abdomen is nondistended, soft and nontender. No organomegaly or masses felt. Normal bowel sounds heard. Central nervous system: Alert and oriented. No focal neurological deficits. Extremities: Symmetric 5 x 5 power. Skin: Left thigh and coccyx area vesicular lesion noted Psychiatry: Judgement and insight appear normal. Mood & affect appropriate.                Diet Orders (From admission, onward)     Start     Ordered   02/25/24 0155  Diet Heart Room service appropriate? Yes; Fluid consistency: Thin  Diet effective now       Question Answer Comment  Room service appropriate? Yes   Fluid consistency: Thin      02/25/24 0155            Objective: Vitals:   02/28/24 1003 02/28/24 1558 02/28/24 2302 02/29/24 0615  BP: (!) 154/84 126/83 139/78 (!) 141/80  Pulse: 76 73 74 70  Resp: 16 16 18 17   Temp: 98.2 F (36.8 C) 98.2 F (36.8 C) 98.4 F (36.9 C) 98.2 F (36.8 C)  TempSrc: Oral Oral Oral Oral  SpO2: 99% 100% 100% 100%  Weight:      Height:        Intake/Output Summary (Last 24 hours) at 02/29/2024 1049 Last data filed at 02/29/2024 0136 Gross per 24 hour  Intake 240 ml  Output --  Net 240 ml   Filed Weights   02/25/24 2255  Weight: 59 kg    Scheduled Meds:  amLODipine   5 mg Oral Daily   bictegravir-emtricitabine -tenofovir  AF  1 tablet Oral Daily   docusate sodium  100 mg Oral  BID   feeding supplement  237 mL Oral BID BM   magnesium  oxide  800 mg Oral Once   methadone  80 mg Oral Daily   Continuous Infusions:  sodium chloride  50 mL/hr at 02/29/24 0136   acyclovir 590 mg (02/29/24 0500)   magnesium  sulfate bolus IVPB      Nutritional status     Body mass index is 23.04 kg/m.  Data Reviewed:   CBC: Recent Labs  Lab 02/24/24 1941 02/24/24 2252 02/25/24 0455 02/26/24 0440 02/29/24 0007  WBC 8.9  --  6.7 6.0 6.8  NEUTROABS 4.6  --   --   --   --   HGB 15.1* 13.6 12.6 14.2 10.8*  HCT 47.5* 40.0 37.2 43.2 32.5*  MCV 83.9  --  81.4  83.2 83.5  PLT 268  --  248 260 248   Basic Metabolic Panel: Recent Labs  Lab 02/25/24 0455 02/26/24 0440 02/27/24 0527 02/28/24 0524 02/29/24 0007 02/29/24 0539  NA 128* 129* 129* 130*  --  132*  K 3.9 4.4 3.8 4.3  --  4.7  CL 95* 96* 97* 98  --  97*  CO2 22 21* 24 21*  --  27  GLUCOSE 119* 114* 98 88  --  117*  BUN 41* 25* 28* 20  --  14  CREATININE 1.53* 1.24* 1.17* 0.95  --  0.93  CALCIUM 9.4 9.4 9.0 8.7*  --  9.2  MG  --  1.8  --   --  1.6*  --    GFR: Estimated Creatinine Clearance: 52.5 mL/min (by C-G formula based on SCr of 0.93 mg/dL). Liver Function Tests: Recent Labs  Lab 02/24/24 2247 02/25/24 0455  AST 41 36  ALT 19 20  ALKPHOS 82 65  BILITOT 0.9 0.9  PROT 9.4* 9.0*  ALBUMIN 3.6 3.0*   No results for input(s): LIPASE, AMYLASE in the last 168 hours. No results for input(s): AMMONIA in the last 168 hours. Coagulation Profile: No results for input(s): INR, PROTIME in the last 168 hours. Cardiac Enzymes: No results for input(s): CKTOTAL, CKMB, CKMBINDEX, TROPONINI in the last 168 hours. BNP (last 3 results) No results for input(s): PROBNP in the last 8760 hours. HbA1C: No results for input(s): HGBA1C in the last 72 hours. CBG: No results for input(s): GLUCAP in the last 168 hours. Lipid Profile: No results for input(s): CHOL, HDL, LDLCALC, TRIG, CHOLHDL, LDLDIRECT in the last 72 hours. Thyroid Function Tests: No results for input(s): TSH, T4TOTAL, FREET4, T3FREE, THYROIDAB in the last 72 hours. Anemia Panel: No results for input(s): VITAMINB12, FOLATE, FERRITIN, TIBC, IRON, RETICCTPCT in the last 72 hours. Sepsis Labs: No results for input(s): PROCALCITON, LATICACIDVEN in the last 168 hours.  No results found for this or any previous visit (from the past 240 hours).       Radiology Studies: No results found.         LOS: 4 days   Time spent= 35 mins    Burgess JAYSON Dare,  MD [facility]  If 7PM-7AM, please contact night-coverage  02/29/2024, 10:49 AM

## 2024-02-29 NOTE — Plan of Care (Signed)
  Problem: Clinical Measurements: Goal: Will remain free from infection Outcome: Progressing   Problem: Clinical Measurements: Goal: Respiratory complications will improve Outcome: Progressing   Problem: Nutrition: Goal: Adequate nutrition will be maintained Outcome: Progressing   Problem: Coping: Goal: Level of anxiety will decrease Outcome: Progressing   Problem: Elimination: Goal: Will not experience complications related to urinary retention Outcome: Progressing   Problem: Pain Managment: Goal: General experience of comfort will improve and/or be controlled Outcome: Progressing   Problem: Safety: Goal: Ability to remain free from injury will improve Outcome: Progressing   Problem: Skin Integrity: Goal: Risk for impaired skin integrity will decrease Outcome: Progressing

## 2024-02-29 NOTE — Progress Notes (Signed)
 [facility]  Date of Admission:  02/24/2024     Reason for Follow Up: Shingles  Total days of antibiotics 6         ASSESSMENT:  Courtney Ellis is a 63 year old African-American female with HIV disease presenting with a rash and found to have acute zoster infection being treated with acyclovir.  Courtney Ellis continues to receive acyclovir with no adverse side effects and slowly improving rash located on her bilateral lower extremities and lower back.  No new sites reported.  Discussed plan of care to continue current dose of acyclovir for additional clinical improvement with eventual transition to oral to complete course of 10-14 days. Continue airborne/contact precautions. Continue Biktarvy for ART. Remaining medical and supportive care per Internal Medicine.    PLAN:  Continue current dose of acyclovir for zoster infection. Continue current dose of Biktarvy for ART for HIV. Contact/airborne precautions for zoster infection. Remaining medical and supportive care per internal medicine.  Principal Problem:   Shingles Active Problems:   Human immunodeficiency virus (HIV) disease (HCC)   Anemia   Essential hypertension   ARF (acute renal failure)   Unilateral primary osteoarthritis, left hip    amLODipine   5 mg Oral Daily   bictegravir-emtricitabine -tenofovir  AF  1 tablet Oral Daily   docusate sodium  100 mg Oral BID   feeding supplement  237 mL Oral BID BM   methadone  80 mg Oral Daily    SUBJECTIVE:  Afebrile overnight with no acute events.  Tolerating acyclovir with no adverse side effects.  Rash seems to be improving slowly with occasional neuropathic pain.  No Known Allergies   Review of Systems: Review of Systems  Constitutional:  Negative for chills, fever and weight loss.  Respiratory:  Negative for cough, shortness of breath and wheezing.   Cardiovascular:  Negative for chest pain and leg swelling.  Gastrointestinal:  Negative for  abdominal pain, constipation, diarrhea, nausea and vomiting.  Skin:  Positive for rash.      OBJECTIVE: Vitals:   02/28/24 2302 02/29/24 0615 02/29/24 1030 02/29/24 1422  BP: 139/78 (!) 141/80 (!) 166/92 (!) 138/98  Pulse: 74 70 74 83  Resp: 18 17 18 18   Temp: 98.4 F (36.9 C) 98.2 F (36.8 C) 97.9 F (36.6 C) 98.2 F (36.8 C)  TempSrc: Oral Oral Oral Oral  SpO2: 100% 100% 100% 98%  Weight:      Height:       Body mass index is 23.04 kg/m.  Physical Exam Constitutional:      General: She is not in acute distress.    Appearance: She is well-developed.  Cardiovascular:     Rate and Rhythm: Normal rate and regular rhythm.     Heart sounds: Normal heart sounds.  Pulmonary:     Effort: Pulmonary effort is normal.     Breath sounds: Normal breath sounds.  Skin:    General: Skin is warm and dry.     Comments: Rash on bilateral lower extremities and low back appears slightly improved from previous.  Neurological:     Mental Status: She is alert.     Lab Results Lab Results  Component Value Date   WBC 6.8 02/29/2024   HGB 10.8 (L) 02/29/2024   HCT 32.5 (L) 02/29/2024   MCV 83.5 02/29/2024   PLT 248 02/29/2024    Lab Results  Component Value Date   CREATININE 0.93 02/29/2024   BUN 14 02/29/2024   NA 132 (L)  02/29/2024   K 4.7 02/29/2024   CL 97 (L) 02/29/2024   CO2 27 02/29/2024    Lab Results  Component Value Date   ALT 20 02/25/2024   AST 36 02/25/2024   GGT 98 (H) 09/10/2017   ALKPHOS 65 02/25/2024   BILITOT 0.9 02/25/2024     Microbiology: No results found for this or any previous visit (from the past 240 hours).   Greg Blaise Palladino, NP [facility]  02/29/2024  3:46 PM

## 2024-02-29 NOTE — Progress Notes (Signed)
 PHARMACY NOTE:  ANTIMICROBIAL RENAL DOSAGE ADJUSTMENT  Current antimicrobial regimen includes a mismatch between antimicrobial dosage and estimated renal function.  As per policy approved by the Pharmacy & Therapeutics and Medical Executive Committees, the antimicrobial dosage will be adjusted accordingly.  Current antimicrobial dosage:  Acyclovir 10 mg/kg every 12 hours   Indication: Disseminated VZV infection   Renal Function:  Estimated Creatinine Clearance: 52.5 mL/min (by C-G formula based on SCr of 0.93 mg/dL). []      On intermittent HD, scheduled: []      On CRRT    Antimicrobial dosage has been changed to:  Acyclovir 10 mg/kg every 8 hours   Additional comments:   Thank you for allowing pharmacy to be a part of this patient's care.  Damien Quiet, PharmD, BCPS, BCIDP Infectious Diseases Clinical Pharmacist Phone: [phone] 02/29/2024 12:42 PM

## 2024-03-01 DIAGNOSIS — B027 Disseminated zoster: Secondary | ICD-10-CM | POA: Diagnosis not present

## 2024-03-01 DIAGNOSIS — B019 Varicella without complication: Secondary | ICD-10-CM | POA: Diagnosis not present

## 2024-03-01 LAB — BASIC METABOLIC PANEL WITH GFR
Anion gap: 8 (ref 5–15)
BUN: 15 mg/dL (ref 8–23)
CO2: 24 mmol/L (ref 22–32)
Calcium: 9 mg/dL (ref 8.9–10.3)
Chloride: 99 mmol/L (ref 98–111)
Creatinine, Ser: 0.71 mg/dL (ref 0.44–1.00)
GFR, Estimated: >60 mL/min (ref >60)
Glucose, Bld: 91 mg/dL (ref 70–99)
Potassium: 4.7 mmol/L (ref 3.5–5.1)
Sodium: 131 mmol/L — ABNORMAL LOW (ref 135–145)

## 2024-03-01 MED ORDER — GABAPENTIN 100 MG PO CAPS
200.0000 mg | ORAL_CAPSULE | Freq: Three times a day (TID) | ORAL | Status: DC
  Administered 2024-03-01 – 2024-03-04 (×10): 200 mg via ORAL
  Filled 2024-03-01 (×11): qty 2

## 2024-03-01 NOTE — Progress Notes (Signed)
 PROGRESS NOTE    Courtney Ellis  FMW:[id number] DOB: 1960-08-24 DOA: 02/24/2024 PCP: Pcp, No    Brief Narrative:   63 year old with history of HIV, substance abuse, HTN, OA, hepatitis C treated for left lower extremity pain about a week ago comes to the ER with concerns of possible rash concerning for shingles.  In the ER noted to have mild hyponatremia.  She also reported of back pain.  ID was called and patient was started on IV acyclovir due to concerns of disseminated varicella-zoster.  Assessment & Plan:  Varicella-zoster infection left lower extremity Back pain -Due to concerns of possible disseminated disease, on empiric IV acyclovir per ID recommendations especially in her immunocompromise state.  MRI of the cervical and thoracic spine shows chronic changes but in the lumbar spine there is concerns of possible early infection therefore will need repeat MRI in the next 10 days.  ID team is following.  Rash seems to be improving - MRI brain negative - Mildly elevated CRP and ESR -Add gabapentin for nerve pain  Acute kidney injury on CKD stage IIIa; improved.  Hyponatremia, hypokalemia - Baseline creatinine 1.1, admission creatinine 1.7.  Resolved with IV fluids  HIV - On Biktarvy  Severe osteoarthritis of the left hip - Follows outpatient orthopedic, Dr. Vernetta  Essential hypertension - Norvasc , IV as needed  History of polysubstance abuse - On methadone  Hypomagnesemia - As needed repletion   DVT prophylaxis: SCDs Start: 02/25/24 0155    Code Status: Full Code Family Communication:   Status is: Inpatient Remains inpatient appropriate because: Continue hospital stay for management of disseminated VZV   PT Follow up Recs:   Subjective: No new complaints Examination:  General exam: Appears calm and comfortable  Respiratory system: Clear to auscultation. Respiratory effort normal. Cardiovascular system: S1 & S2 heard, RRR. No JVD, murmurs, rubs, gallops or  clicks. No pedal edema. Gastrointestinal system: Abdomen is nondistended, soft and nontender. No organomegaly or masses felt. Normal bowel sounds heard. Central nervous system: Alert and oriented. No focal neurological deficits. Extremities: Symmetric 5 x 5 power. Skin: Left thigh and coccyx area vesicular lesion noted Psychiatry: Judgement and insight appear normal. Mood & affect appropriate.                   Diet Orders (From admission, onward)     Start     Ordered   02/25/24 0155  Diet Heart Room service appropriate? Yes; Fluid consistency: Thin  Diet effective now       Question Answer Comment  Room service appropriate? Yes   Fluid consistency: Thin      02/25/24 0155            Objective: Vitals:   02/29/24 1030 02/29/24 1422 02/29/24 2250 03/01/24 0500  BP: (!) 166/92 (!) 138/98 138/85 130/87  Pulse: 74 83 72 90  Resp: 18 18 18 18   Temp: 97.9 F (36.6 C) 98.2 F (36.8 C) 98.2 F (36.8 C) 98.1 F (36.7 C)  TempSrc: Oral Oral Oral Oral  SpO2: 100% 98% 100% 100%  Weight:      Height:        Intake/Output Summary (Last 24 hours) at 03/01/2024 1034 Last data filed at 03/01/2024 0300 Gross per 24 hour  Intake 3031.27 ml  Output --  Net 3031.27 ml   Filed Weights   02/25/24 2255  Weight: 59 kg    Scheduled Meds:  amLODipine   5 mg Oral Daily   bictegravir-emtricitabine -tenofovir  AF  1 tablet Oral Daily   docusate sodium  100 mg Oral BID   feeding supplement  237 mL Oral BID BM   gabapentin  200 mg Oral TID   methadone  80 mg Oral Daily   Continuous Infusions:  sodium chloride  50 mL/hr at 03/01/24 0300   acyclovir 525 mg (03/01/24 0530)    Nutritional status     Body mass index is 23.04 kg/m.  Data Reviewed:   CBC: Recent Labs  Lab 02/24/24 1941 02/24/24 2252 02/25/24 0455 02/26/24 0440 02/29/24 0007  WBC 8.9  --  6.7 6.0 6.8  NEUTROABS 4.6  --   --   --   --   HGB 15.1* 13.6 12.6 14.2 10.8*  HCT 47.5* 40.0 37.2 43.2  32.5*  MCV 83.9  --  81.4 83.2 83.5  PLT 268  --  248 260 248   Basic Metabolic Panel: Recent Labs  Lab 02/26/24 0440 02/27/24 0527 02/28/24 0524 02/29/24 0007 02/29/24 0539 03/01/24 0438  NA 129* 129* 130*  --  132* 131*  K 4.4 3.8 4.3  --  4.7 4.7  CL 96* 97* 98  --  97* 99  CO2 21* 24 21*  --  27 24  GLUCOSE 114* 98 88  --  117* 91  BUN 25* 28* 20  --  14 15  CREATININE 1.24* 1.17* 0.95  --  0.93 0.71  CALCIUM 9.4 9.0 8.7*  --  9.2 9.0  MG 1.8  --   --  1.6*  --   --    GFR: Estimated Creatinine Clearance: 61.1 mL/min (by C-G formula based on SCr of 0.71 mg/dL). Liver Function Tests: Recent Labs  Lab 02/24/24 2247 02/25/24 0455  AST 41 36  ALT 19 20  ALKPHOS 82 65  BILITOT 0.9 0.9  PROT 9.4* 9.0*  ALBUMIN 3.6 3.0*   No results for input(s): LIPASE, AMYLASE in the last 168 hours. No results for input(s): AMMONIA in the last 168 hours. Coagulation Profile: No results for input(s): INR, PROTIME in the last 168 hours. Cardiac Enzymes: No results for input(s): CKTOTAL, CKMB, CKMBINDEX, TROPONINI in the last 168 hours. BNP (last 3 results) No results for input(s): PROBNP in the last 8760 hours. HbA1C: No results for input(s): HGBA1C in the last 72 hours. CBG: No results for input(s): GLUCAP in the last 168 hours. Lipid Profile: No results for input(s): CHOL, HDL, LDLCALC, TRIG, CHOLHDL, LDLDIRECT in the last 72 hours. Thyroid Function Tests: No results for input(s): TSH, T4TOTAL, FREET4, T3FREE, THYROIDAB in the last 72 hours. Anemia Panel: No results for input(s): VITAMINB12, FOLATE, FERRITIN, TIBC, IRON, RETICCTPCT in the last 72 hours. Sepsis Labs: No results for input(s): PROCALCITON, LATICACIDVEN in the last 168 hours.  No results found for this or any previous visit (from the past 240 hours).       Radiology Studies: No results found.         LOS: 5 days   Time spent= 35  mins    Burgess JAYSON Dare, MD [facility]  If 7PM-7AM, please contact night-coverage  03/01/2024, 10:34 AM

## 2024-03-01 NOTE — Progress Notes (Signed)
 [facility]  Date of Admission:  02/24/2024     Reason for Follow Up: Shingles  Total days of antibiotics 7         ASSESSMENT:  Courtney Ellis is a 63 year old African-American female with HIV disease presenting with a rash and found to have acute zoster infection being treated with acyclovir.   Ms. Jump lesions appear slightly improved from yesterday with no new lesions noted. Lesions are not quite to the point for transition to oral and discussed recommended plan of care to continue with IV acyclovir and continue to monitor lesions. Has occasional neuropathic pain that waxes and wanes. Continue airborne/contact precautions. Remaining medical and supportive care per Internal Medicine.  PLAN:  Continue current dose of acyclovir.  Monitor lesions for continued improvement  Continue airborne/contact precautions Remaining medical and supportive care per Internal Medicine.   Principal Problem:   Shingles Active Problems:   Human immunodeficiency virus (HIV) disease (HCC)   Anemia   Essential hypertension   ARF (acute renal failure)   Unilateral primary osteoarthritis, left hip   Disseminated varicella    amLODipine   5 mg Oral Daily   bictegravir-emtricitabine -tenofovir  AF  1 tablet Oral Daily   docusate sodium  100 mg Oral BID   feeding supplement  237 mL Oral BID BM   gabapentin  200 mg Oral TID   methadone  80 mg Oral Daily    SUBJECTIVE:  Afebrile overnight with no acute events. Tolerating acyclovir with no adverse side effects.   No Known Allergies   Review of Systems: Review of Systems  Constitutional:  Negative for chills, fever and weight loss.  Respiratory:  Negative for cough, shortness of breath and wheezing.   Cardiovascular:  Negative for chest pain and leg swelling.  Gastrointestinal:  Negative for abdominal pain, constipation, diarrhea, nausea and vomiting.  Skin:  Positive for rash.      OBJECTIVE: Vitals:    02/29/24 1422 02/29/24 2250 03/01/24 0500 03/01/24 1040  BP: (!) 138/98 138/85 130/87 132/89  Pulse: 83 72 90 74  Resp: 18 18 18 18   Temp: 98.2 F (36.8 C) 98.2 F (36.8 C) 98.1 F (36.7 C) 98.3 F (36.8 C)  TempSrc: Oral Oral Oral Oral  SpO2: 98% 100% 100% 100%  Weight:      Height:       Body mass index is 23.04 kg/m.  Physical Exam Constitutional:      General: She is not in acute distress.    Appearance: She is well-developed.  Cardiovascular:     Rate and Rhythm: Normal rate and regular rhythm.     Heart sounds: Normal heart sounds.  Pulmonary:     Effort: Pulmonary effort is normal.     Breath sounds: Normal breath sounds.  Skin:    General: Skin is warm and dry.     Comments: Lesions appear slightly more dry compared to previous exam.   Neurological:     Mental Status: She is alert and oriented to person, place, and time.     Lab Results Lab Results  Component Value Date   WBC 6.8 02/29/2024   HGB 10.8 (L) 02/29/2024   HCT 32.5 (L) 02/29/2024   MCV 83.5 02/29/2024   PLT 248 02/29/2024    Lab Results  Component Value Date   CREATININE 0.71 03/01/2024   BUN 15 03/01/2024   NA 131 (L) 03/01/2024   K 4.7 03/01/2024   CL 99 03/01/2024   CO2 24  03/01/2024    Lab Results  Component Value Date   ALT 20 02/25/2024   AST 36 02/25/2024   GGT 98 (H) 09/10/2017   ALKPHOS 65 02/25/2024   BILITOT 0.9 02/25/2024     Microbiology: No results found for this or any previous visit (from the past 240 hours).   Greg Khiem Gargis, NP [facility]  03/01/2024  11:44 AM

## 2024-03-01 NOTE — Plan of Care (Signed)
  Problem: Clinical Measurements: Goal: Will remain free from infection Outcome: Progressing   Problem: Pain Managment: Goal: General experience of comfort will improve and/or be controlled Outcome: Progressing

## 2024-03-02 DIAGNOSIS — B20 Human immunodeficiency virus [HIV] disease: Secondary | ICD-10-CM | POA: Diagnosis not present

## 2024-03-02 DIAGNOSIS — B027 Disseminated zoster: Secondary | ICD-10-CM | POA: Diagnosis not present

## 2024-03-02 LAB — BASIC METABOLIC PANEL WITH GFR
Anion gap: 8 (ref 5–15)
BUN: 18 mg/dL (ref 8–23)
CO2: 26 mmol/L (ref 22–32)
Calcium: 9.3 mg/dL (ref 8.9–10.3)
Chloride: 99 mmol/L (ref 98–111)
Creatinine, Ser: 0.87 mg/dL (ref 0.44–1.00)
GFR, Estimated: >60 mL/min (ref >60)
Glucose, Bld: 133 mg/dL — ABNORMAL HIGH (ref 70–99)
Potassium: 4.6 mmol/L (ref 3.5–5.1)
Sodium: 133 mmol/L — ABNORMAL LOW (ref 135–145)

## 2024-03-02 NOTE — Progress Notes (Signed)
 PROGRESS NOTE    Courtney Ellis  FMW:[id number] DOB: May 21, 1960 DOA: 02/24/2024 PCP: Pcp, No    Brief Narrative:   63 year old with history of HIV, substance abuse, HTN, OA, hepatitis C treated for left lower extremity pain about a week ago comes to the ER with concerns of possible rash concerning for shingles.  In the ER noted to have mild hyponatremia.  She also reported of back pain.  ID was called and patient was started on IV acyclovir due to concerns of disseminated varicella-zoster.  MRI showed lots of chronic changes along with possible concerns of early infection.  Currently her lesions are slowly improving on IV acyclovir.  Plan is to repeat MRI in a few days.  Assessment & Plan:  Varicella-zoster infection left lower extremity Back pain -Due to concerns of possible disseminated disease, on empiric IV acyclovir per ID recommendations especially in her immunocompromise state.  MRI of the cervical and thoracic spine shows chronic changes but in the lumbar spine there is concerns of possible early infection therefore will need repeat MRI in the next few days.  ID team is following.  Rash seems to be improving, PO Antiviral once lesions have crusted over. - MRI brain negative - Mildly elevated CRP and ESR -Added gabapentin for nerve pain  Acute kidney injury on CKD stage IIIa; Resolved. Hyponatremia, hypokalemia - Baseline creatinine 1.1, admission creatinine 1.7.  Resolved with IV fluids  HIV - On Biktarvy  Severe osteoarthritis of the left hip - Follows outpatient orthopedic, Dr. Vernetta  Essential hypertension - Norvasc , IV as needed  History of polysubstance abuse - On methadone  Hypomagnesemia - As needed repletion   DVT prophylaxis: SCDs Start: 02/25/24 0155    Code Status: Full Code Family Communication:   Status is: Inpatient Remains inpatient appropriate because: Continue hospital stay for management of disseminated VZV   PT Follow up Recs:    Subjective: No new complaints Lesion appears to be much better Examination:  General exam: Appears calm and comfortable  Respiratory system: Clear to auscultation. Respiratory effort normal. Cardiovascular system: S1 & S2 heard, RRR. No JVD, murmurs, rubs, gallops or clicks. No pedal edema. Gastrointestinal system: Abdomen is nondistended, soft and nontender. No organomegaly or masses felt. Normal bowel sounds heard. Central nervous system: Alert and oriented. No focal neurological deficits. Extremities: Symmetric 5 x 5 power. Skin: Left thigh and coccyx area vesicular lesion noted, slowly crusting over Psychiatry: Judgement and insight appear normal. Mood & affect appropriate.                Diet Orders (From admission, onward)     Start     Ordered   02/25/24 0155  Diet Heart Room service appropriate? Yes; Fluid consistency: Thin  Diet effective now       Question Answer Comment  Room service appropriate? Yes   Fluid consistency: Thin      02/25/24 0155            Objective: Vitals:   03/01/24 1837 03/01/24 2131 03/02/24 0631 03/02/24 1056  BP: (!) 149/91 (!) 145/92 (!) 151/90 (!) 149/92  Pulse: 79 75 73 74  Resp: 18   17  Temp: 98 F (36.7 C) 98.2 F (36.8 C) 98.5 F (36.9 C) 98.3 F (36.8 C)  TempSrc:  Oral Oral Oral  SpO2: 100% 100% 100% 100%  Weight:      Height:        Intake/Output Summary (Last 24 hours) at 03/02/2024 1132 Last data  filed at 03/01/2024 2232 Gross per 24 hour  Intake 840 ml  Output --  Net 840 ml   Filed Weights   02/25/24 2255  Weight: 59 kg    Scheduled Meds:  amLODipine   5 mg Oral Daily   bictegravir-emtricitabine -tenofovir  AF  1 tablet Oral Daily   docusate sodium  100 mg Oral BID   feeding supplement  237 mL Oral BID BM   gabapentin  200 mg Oral TID   methadone  80 mg Oral Daily   Continuous Infusions:  sodium chloride  50 mL/hr at 03/01/24 0300   acyclovir 525 mg (03/02/24 0640)    Nutritional  status     Body mass index is 23.04 kg/m.  Data Reviewed:   CBC: Recent Labs  Lab 02/24/24 1941 02/24/24 2252 02/25/24 0455 02/26/24 0440 02/29/24 0007  WBC 8.9  --  6.7 6.0 6.8  NEUTROABS 4.6  --   --   --   --   HGB 15.1* 13.6 12.6 14.2 10.8*  HCT 47.5* 40.0 37.2 43.2 32.5*  MCV 83.9  --  81.4 83.2 83.5  PLT 268  --  248 260 248   Basic Metabolic Panel: Recent Labs  Lab 02/26/24 0440 02/27/24 0527 02/28/24 0524 02/29/24 0007 02/29/24 0539 03/01/24 0438 03/02/24 0501  NA 129* 129* 130*  --  132* 131* 133*  K 4.4 3.8 4.3  --  4.7 4.7 4.6  CL 96* 97* 98  --  97* 99 99  CO2 21* 24 21*  --  27 24 26   GLUCOSE 114* 98 88  --  117* 91 133*  BUN 25* 28* 20  --  14 15 18   CREATININE 1.24* 1.17* 0.95  --  0.93 0.71 0.87  CALCIUM 9.4 9.0 8.7*  --  9.2 9.0 9.3  MG 1.8  --   --  1.6*  --   --   --    GFR: Estimated Creatinine Clearance: 56.2 mL/min (by C-G formula based on SCr of 0.87 mg/dL). Liver Function Tests: Recent Labs  Lab 02/24/24 2247 02/25/24 0455  AST 41 36  ALT 19 20  ALKPHOS 82 65  BILITOT 0.9 0.9  PROT 9.4* 9.0*  ALBUMIN 3.6 3.0*   No results for input(s): LIPASE, AMYLASE in the last 168 hours. No results for input(s): AMMONIA in the last 168 hours. Coagulation Profile: No results for input(s): INR, PROTIME in the last 168 hours. Cardiac Enzymes: No results for input(s): CKTOTAL, CKMB, CKMBINDEX, TROPONINI in the last 168 hours. BNP (last 3 results) No results for input(s): PROBNP in the last 8760 hours. HbA1C: No results for input(s): HGBA1C in the last 72 hours. CBG: No results for input(s): GLUCAP in the last 168 hours. Lipid Profile: No results for input(s): CHOL, HDL, LDLCALC, TRIG, CHOLHDL, LDLDIRECT in the last 72 hours. Thyroid Function Tests: No results for input(s): TSH, T4TOTAL, FREET4, T3FREE, THYROIDAB in the last 72 hours. Anemia Panel: No results for input(s): VITAMINB12,  FOLATE, FERRITIN, TIBC, IRON, RETICCTPCT in the last 72 hours. Sepsis Labs: No results for input(s): PROCALCITON, LATICACIDVEN in the last 168 hours.  No results found for this or any previous visit (from the past 240 hours).       Radiology Studies: No results found.         LOS: 6 days   Time spent= 35 mins    Burgess JAYSON Dare, MD [facility]  If 7PM-7AM, please contact night-coverage  03/02/2024, 11:32 AM

## 2024-03-02 NOTE — Plan of Care (Signed)

## 2024-03-02 NOTE — Progress Notes (Signed)
 [facility]  Date of Admission:  02/24/2024     Reason for Follow Up: Shingles  Total days of antibiotics 8         ASSESSMENT:  Courtney Ellis is a 63 year old African-American female with HIV disease presenting with a rash and found to have acute zoster infection being treated with acyclovir.   Courtney Ellis has areas of her lesions that appear like the scabbing has been taken off and would be unlikely these represent new lesions. Discussed plan of care to continue with current dose of IV acyclovir and recheck for any additional lesions or continued healing daily. Anticipate change to oral antiviral once lesions improve. Continue airborne/contact precautions. Continue Biktarvy for ART.  Remaining medical and supportive care per Internal Medicine.   PLAN:  Continue current dose of IV acyclovir.  Monitor lesions for healing and change to oral therapy when appropriate.  Continue Biktarvy for ART Airborne/contact precautions.  Remaining medical and supportive care per Internal Medicine.   Principal Problem:   Shingles Active Problems:   Human immunodeficiency virus (HIV) disease (HCC)   Anemia   Essential hypertension   ARF (acute renal failure)   Unilateral primary osteoarthritis, left hip   Disseminated varicella    amLODipine   5 mg Oral Daily   bictegravir-emtricitabine -tenofovir  AF  1 tablet Oral Daily   docusate sodium  100 mg Oral BID   feeding supplement  237 mL Oral BID BM   gabapentin  200 mg Oral TID   methadone  80 mg Oral Daily    SUBJECTIVE:  Afebrile overnight with no acute events. Tolerating acyclovir with no adverse side effects. Continues to have waxing and waning neuropathic pain of greater frequency but less duration.   No Known Allergies   Review of Systems: Review of Systems  Constitutional:  Negative for chills, fever and weight loss.  Respiratory:  Negative for cough, shortness of breath and wheezing.   Cardiovascular:   Negative for chest pain and leg swelling.  Gastrointestinal:  Negative for abdominal pain, constipation, diarrhea, nausea and vomiting.  Skin:  Positive for rash.      OBJECTIVE: Vitals:   03/01/24 1837 03/01/24 2131 03/02/24 0631 03/02/24 1056  BP: (!) 149/91 (!) 145/92 (!) 151/90 (!) 149/92  Pulse: 79 75 73 74  Resp: 18   17  Temp: 98 F (36.7 C) 98.2 F (36.8 C) 98.5 F (36.9 C) 98.3 F (36.8 C)  TempSrc:  Oral Oral Oral  SpO2: 100% 100% 100% 100%  Weight:      Height:       Body mass index is 23.04 kg/m.  Physical Exam Constitutional:      General: She is not in acute distress.    Appearance: She is well-developed.  Cardiovascular:     Rate and Rhythm: Normal rate and regular rhythm.     Heart sounds: Normal heart sounds.  Pulmonary:     Effort: Pulmonary effort is normal.     Breath sounds: Normal breath sounds.  Skin:    General: Skin is warm and dry.     Comments: Lesions on left thigh appear slightly open as well on lower back. Lesions do not appear new but cannot be excluded.   Neurological:     Mental Status: She is alert and oriented to person, place, and time.     Lab Results Lab Results  Component Value Date   WBC 6.8 02/29/2024   HGB 10.8 (L) 02/29/2024   HCT  32.5 (L) 02/29/2024   MCV 83.5 02/29/2024   PLT 248 02/29/2024    Lab Results  Component Value Date   CREATININE 0.87 03/02/2024   BUN 18 03/02/2024   NA 133 (L) 03/02/2024   K 4.6 03/02/2024   CL 99 03/02/2024   CO2 26 03/02/2024    Lab Results  Component Value Date   ALT 20 02/25/2024   AST 36 02/25/2024   GGT 98 (H) 09/10/2017   ALKPHOS 65 02/25/2024   BILITOT 0.9 02/25/2024     Microbiology: No results found for this or any previous visit (from the past 240 hours).   [name], NP [facility]  03/02/2024  2:01 PM

## 2024-03-02 NOTE — Plan of Care (Signed)
 New PIV placed during shift. Patient voiced concern that she thought that gabapentin was making her pain worse, but was willing to try it again last night.  This morning pt stated that her pain was well controlled last night and that she slept well. Problem: Clinical Measurements: Goal: Ability to maintain clinical measurements within normal limits will improve Outcome: Progressing Goal: Will remain free from infection Outcome: Progressing Goal: Diagnostic test results will improve Outcome: Progressing   Problem: Coping: Goal: Level of anxiety will decrease Outcome: Progressing   Problem: Pain Managment: Goal: General experience of comfort will improve and/or be controlled Outcome: Progressing   Problem: Safety: Goal: Ability to remain free from injury will improve Outcome: Progressing

## 2024-03-03 DIAGNOSIS — B20 Human immunodeficiency virus [HIV] disease: Secondary | ICD-10-CM | POA: Diagnosis not present

## 2024-03-03 DIAGNOSIS — B027 Disseminated zoster: Secondary | ICD-10-CM | POA: Diagnosis not present

## 2024-03-03 LAB — MAGNESIUM: Magnesium: 1.4 mg/dL — ABNORMAL LOW (ref 1.7–2.4)

## 2024-03-03 LAB — BASIC METABOLIC PANEL WITH GFR
Anion gap: 9 (ref 5–15)
BUN: 27 mg/dL — ABNORMAL HIGH (ref 8–23)
CO2: 24 mmol/L (ref 22–32)
Calcium: 8.9 mg/dL (ref 8.9–10.3)
Chloride: 99 mmol/L (ref 98–111)
Creatinine, Ser: 0.73 mg/dL (ref 0.44–1.00)
GFR, Estimated: >60 mL/min (ref >60)
Glucose, Bld: 115 mg/dL — ABNORMAL HIGH (ref 70–99)
Potassium: 4 mmol/L (ref 3.5–5.1)
Sodium: 132 mmol/L — ABNORMAL LOW (ref 135–145)

## 2024-03-03 LAB — CBC
HCT: 31.9 % — ABNORMAL LOW (ref 36.0–46.0)
Hemoglobin: 10.3 g/dL — ABNORMAL LOW (ref 12.0–15.0)
MCH: 27.7 pg (ref 26.0–34.0)
MCHC: 32.3 g/dL (ref 30.0–36.0)
MCV: 85.8 fL (ref 80.0–100.0)
Platelets: 292 K/uL (ref 150–400)
RBC: 3.72 MIL/uL — ABNORMAL LOW (ref 3.87–5.11)
RDW: 14 % (ref 11.5–15.5)
WBC: 5.9 K/uL (ref 4.0–10.5)
nRBC: 0 % (ref 0.0–0.2)

## 2024-03-03 MED ORDER — MAGNESIUM SULFATE 4 GM/100ML IV SOLN
4.0000 g | Freq: Once | INTRAVENOUS | Status: AC
  Administered 2024-03-03: 4 g via INTRAVENOUS
  Filled 2024-03-03: qty 100

## 2024-03-03 MED ORDER — MAGNESIUM OXIDE -MG SUPPLEMENT 400 (240 MG) MG PO TABS
800.0000 mg | ORAL_TABLET | Freq: Once | ORAL | Status: AC
  Administered 2024-03-03: 800 mg via ORAL
  Filled 2024-03-03: qty 2

## 2024-03-03 NOTE — Progress Notes (Signed)
 PROGRESS NOTE    Courtney Ellis  FMW:[id number] DOB: Sep 27, 1960 DOA: 02/24/2024 PCP: Pcp, No    Brief Narrative:   63 year old with history of HIV, substance abuse, HTN, OA, hepatitis C treated for left lower extremity pain about a week ago comes to the ER with concerns of possible rash concerning for shingles.  In the ER noted to have mild hyponatremia.  She also reported of back pain.  ID was called and patient was started on IV acyclovir due to concerns of disseminated varicella-zoster.  MRI showed lots of chronic changes along with possible concerns of early infection.  Currently her lesions are slowly improving on IV acyclovir.  Tentative plans to repeat MRI of the lumbar spine on 11/15.  Assessment & Plan:  Varicella-zoster infection left lower extremity Back pain -Due to concerns of possible disseminated disease, on empiric IV acyclovir per ID recommendations especially in her immunocompromise state.  MRI of the cervical and thoracic spine shows chronic changes but in the lumbar spine there is concerns of possible early infection therefore will need repeat MRI 11/15 (ordered).  ID team is following.  Rash seems to be improving, PO Antiviral once lesions have crusted over. - MRI brain negative - Mildly elevated CRP and ESR -Added gabapentin for nerve pain  Acute kidney injury on CKD stage IIIa; Resolved. Hyponatremia, hypokalemia/Hypomag - Baseline creatinine 1.1, admission creatinine 1.7.  Resolved with IV fluids  HIV - On Biktarvy  Severe osteoarthritis of the left hip - Follows outpatient orthopedic, Dr. Vernetta  Essential hypertension - Norvasc , IV as needed  History of polysubstance abuse - On methadone  Hypomagnesemia - As needed repletion   DVT prophylaxis: SCDs Start: 02/25/24 0155    Code Status: Full Code Family Communication:   Status is: Inpatient Remains inpatient appropriate because: Continue hospital stay for management of disseminated VZV   PT  Follow up Recs:   Subjective: Seen at bedside feeling okay no complaints   Examination:  General exam: Appears calm and comfortable  Respiratory system: Clear to auscultation. Respiratory effort normal. Cardiovascular system: S1 & S2 heard, RRR. No JVD, murmurs, rubs, gallops or clicks. No pedal edema. Gastrointestinal system: Abdomen is nondistended, soft and nontender. No organomegaly or masses felt. Normal bowel sounds heard. Central nervous system: Alert and oriented. No focal neurological deficits. Extremities: Symmetric 5 x 5 power. Skin: Left thigh and coccyx area vesicular lesion noted, slowly crusting over Psychiatry: Judgement and insight appear normal. Mood & affect appropriate.                Diet Orders (From admission, onward)     Start     Ordered   02/25/24 0155  Diet Heart Room service appropriate? Yes; Fluid consistency: Thin  Diet effective now       Question Answer Comment  Room service appropriate? Yes   Fluid consistency: Thin      02/25/24 0155            Objective: Vitals:   03/02/24 1841 03/02/24 2035 03/03/24 0711 03/03/24 1034  BP: 139/75 116/80 136/82 (!) 158/85  Pulse: 79 84 72 75  Resp: 17 17 17 16   Temp: 98.1 F (36.7 C) 98.6 F (37 C) 98.3 F (36.8 C) 98.2 F (36.8 C)  TempSrc: Oral Oral Oral Oral  SpO2: 99% 100% 99% 100%  Weight:      Height:        Intake/Output Summary (Last 24 hours) at 03/03/2024 1109 Last data filed at 03/03/2024 0900  Gross per 24 hour  Intake 120 ml  Output --  Net 120 ml   Filed Weights   02/25/24 2255  Weight: 59 kg    Scheduled Meds:  amLODipine   5 mg Oral Daily   bictegravir-emtricitabine -tenofovir  AF  1 tablet Oral Daily   docusate sodium  100 mg Oral BID   feeding supplement  237 mL Oral BID BM   gabapentin  200 mg Oral TID   magnesium  oxide  800 mg Oral Once   methadone  80 mg Oral Daily   Continuous Infusions:  sodium chloride  50 mL/hr at 03/02/24 2112   acyclovir 525  mg (03/03/24 0650)   magnesium  sulfate bolus IVPB      Nutritional status     Body mass index is 23.04 kg/m.  Data Reviewed:   CBC: Recent Labs  Lab 02/26/24 0440 02/29/24 0007 03/03/24 0032  WBC 6.0 6.8 5.9  HGB 14.2 10.8* 10.3*  HCT 43.2 32.5* 31.9*  MCV 83.2 83.5 85.8  PLT 260 248 292   Basic Metabolic Panel: Recent Labs  Lab 02/26/24 0440 02/27/24 0527 02/28/24 0524 02/29/24 0007 02/29/24 0539 03/01/24 0438 03/02/24 0501 03/03/24 0032  NA 129*   < > 130*  --  132* 131* 133* 132*  K 4.4   < > 4.3  --  4.7 4.7 4.6 4.0  CL 96*   < > 98  --  97* 99 99 99  CO2 21*   < > 21*  --  27 24 26 24   GLUCOSE 114*   < > 88  --  117* 91 133* 115*  BUN 25*   < > 20  --  14 15 18  27*  CREATININE 1.24*   < > 0.95  --  0.93 0.71 0.87 0.73  CALCIUM 9.4   < > 8.7*  --  9.2 9.0 9.3 8.9  MG 1.8  --   --  1.6*  --   --   --  1.4*   < > = values in this interval not displayed.   GFR: Estimated Creatinine Clearance: 61.1 mL/min (by C-G formula based on SCr of 0.73 mg/dL). Liver Function Tests: No results for input(s): AST, ALT, ALKPHOS, BILITOT, PROT, ALBUMIN in the last 168 hours. No results for input(s): LIPASE, AMYLASE in the last 168 hours. No results for input(s): AMMONIA in the last 168 hours. Coagulation Profile: No results for input(s): INR, PROTIME in the last 168 hours. Cardiac Enzymes: No results for input(s): CKTOTAL, CKMB, CKMBINDEX, TROPONINI in the last 168 hours. BNP (last 3 results) No results for input(s): PROBNP in the last 8760 hours. HbA1C: No results for input(s): HGBA1C in the last 72 hours. CBG: No results for input(s): GLUCAP in the last 168 hours. Lipid Profile: No results for input(s): CHOL, HDL, LDLCALC, TRIG, CHOLHDL, LDLDIRECT in the last 72 hours. Thyroid Function Tests: No results for input(s): TSH, T4TOTAL, FREET4, T3FREE, THYROIDAB in the last 72 hours. Anemia Panel: No results for  input(s): VITAMINB12, FOLATE, FERRITIN, TIBC, IRON, RETICCTPCT in the last 72 hours. Sepsis Labs: No results for input(s): PROCALCITON, LATICACIDVEN in the last 168 hours.  No results found for this or any previous visit (from the past 240 hours).       Radiology Studies: No results found.         LOS: 7 days   Time spent= 35 mins    Burgess JAYSON Dare, MD [facility]  If 7PM-7AM, please contact night-coverage  03/03/2024, 11:09 AM

## 2024-03-03 NOTE — Progress Notes (Signed)
 Notified MRI, Lyle Mink, RT, of recommendation from Alm Baptist, RN Infection Prevention that the 11/15 MRI will need to be at the end of the day and there needs to be a 2 hour air exchange after the procedure.

## 2024-03-03 NOTE — Plan of Care (Signed)

## 2024-03-03 NOTE — Progress Notes (Signed)
 [facility]  Date of Admission:  02/24/2024     Reason for Follow Up: Shingles  Total days of antibiotics 9         ASSESSMENT:  [name] is a 63 year old African-American female with HIV disease presenting with a rash and found to have acute zoster infection being treated with acyclovir.   Courtney Ellis lesions continue to improve slowly and does not appear as though there are any new lesions since last assessment. Discussed recommended plan of care to continue with current dose of IV acyclovir today and likely change to oral regimen tomorrow. Anticipate likely ready for discharge tomorrow from ID standpoint. Course of healing will be slow.  Continue airborne/contact precautions. Continue Biktarvy for ART. Remaining medical and supportive care per Internal Medicine.   PLAN:  Continue current dose of acyclovir.  Continue Biktarvy for ART Continue to monitor lesions for healing. Anticipate changing to oral medications in the next 24 hours.  Airborne/contact precautions. Remaining medical and supportive care per Internal Medicine.   Principal Problem:   Shingles Active Problems:   Human immunodeficiency virus (HIV) disease (HCC)   Anemia   Essential hypertension   ARF (acute renal failure)   Unilateral primary osteoarthritis, left hip   Disseminated varicella    amLODipine   5 mg Oral Daily   bictegravir-emtricitabine -tenofovir  AF  1 tablet Oral Daily   docusate sodium  100 mg Oral BID   feeding supplement  237 mL Oral BID BM   gabapentin  200 mg Oral TID   magnesium  oxide  800 mg Oral Once   methadone  80 mg Oral Daily    SUBJECTIVE:  Afebrile overnight with no acute events. Tolerating acyclovir with no adverse side effects. Continues to feel better and still with occasional sharp pains.   No Known Allergies   Review of Systems: Review of Systems  Constitutional:  Negative for chills, fever and weight loss.  Respiratory:  Negative  for cough, shortness of breath and wheezing.   Cardiovascular:  Negative for chest pain and leg swelling.  Gastrointestinal:  Negative for abdominal pain, constipation, diarrhea, nausea and vomiting.  Skin:  Positive for rash.      OBJECTIVE: Vitals:   03/02/24 1056 03/02/24 1841 03/02/24 2035 03/03/24 0711  BP: (!) 149/92 139/75 116/80 136/82  Pulse: 74 79 84 72  Resp: 17 17 17 17   Temp: 98.3 F (36.8 C) 98.1 F (36.7 C) 98.6 F (37 C) 98.3 F (36.8 C)  TempSrc: Oral Oral Oral Oral  SpO2: 100% 99% 100% 99%  Weight:      Height:       Body mass index is 23.04 kg/m.  Physical Exam Constitutional:      General: She is not in acute distress.    Appearance: She is well-developed.  Cardiovascular:     Rate and Rhythm: Normal rate and regular rhythm.     Heart sounds: Normal heart sounds.  Pulmonary:     Effort: Pulmonary effort is normal.     Breath sounds: Normal breath sounds.  Skin:    General: Skin is warm and dry.     Comments: New areas from yesterday appear dry and may be cracking of skin as opposed to new lesions.   Neurological:     Mental Status: She is alert and oriented to person, place, and time.  Psychiatric:        Mood and Affect: Mood normal.     Lab Results Lab Results  Component Value Date   WBC 5.9 03/03/2024   HGB 10.3 (L) 03/03/2024   HCT 31.9 (L) 03/03/2024   MCV 85.8 03/03/2024   PLT 292 03/03/2024    Lab Results  Component Value Date   CREATININE 0.73 03/03/2024   BUN 27 (H) 03/03/2024   NA 132 (L) 03/03/2024   K 4.0 03/03/2024   CL 99 03/03/2024   CO2 24 03/03/2024    Lab Results  Component Value Date   ALT 20 02/25/2024   AST 36 02/25/2024   GGT 98 (H) 09/10/2017   ALKPHOS 65 02/25/2024   BILITOT 0.9 02/25/2024     Microbiology: No results found for this or any previous visit (from the past 240 hours).   Greg Gadge Hermiz, NP [facility]  03/03/2024  10:24 AM

## 2024-03-03 NOTE — Plan of Care (Signed)

## 2024-03-04 ENCOUNTER — Other Ambulatory Visit (HOSPITAL_COMMUNITY): Payer: Self-pay

## 2024-03-04 DIAGNOSIS — Z79899 Other long term (current) drug therapy: Secondary | ICD-10-CM | POA: Diagnosis not present

## 2024-03-04 DIAGNOSIS — B027 Disseminated zoster: Secondary | ICD-10-CM | POA: Diagnosis not present

## 2024-03-04 DIAGNOSIS — B20 Human immunodeficiency virus [HIV] disease: Secondary | ICD-10-CM | POA: Diagnosis not present

## 2024-03-04 MED ORDER — GABAPENTIN 100 MG PO CAPS
200.0000 mg | ORAL_CAPSULE | Freq: Three times a day (TID) | ORAL | 0 refills | Status: DC
  Filled 2024-03-04: qty 180, 30d supply, fill #0

## 2024-03-04 MED ORDER — VALACYCLOVIR HCL 1 G PO TABS
1000.0000 mg | ORAL_TABLET | Freq: Three times a day (TID) | ORAL | 0 refills | Status: AC
  Filled 2024-03-04: qty 30, 10d supply, fill #0

## 2024-03-04 MED ORDER — AMLODIPINE BESYLATE 5 MG PO TABS
5.0000 mg | ORAL_TABLET | Freq: Every day | ORAL | 0 refills | Status: AC
  Filled 2024-03-04: qty 30, 30d supply, fill #0

## 2024-03-04 NOTE — Discharge Summary (Signed)
 Physician Discharge Summary  Courtney Ellis FMW:[id number] DOB: Aug 15, 1960 DOA: 02/24/2024  PCP: Pcp, No  Admit date: 02/24/2024 Discharge date: 03/04/2024  Time spent: 40 minutes  Recommendations for Outpatient Follow-up:  Follow outpatient CBC/CMP  Follow with infectious disease outpatient Repeat MRI with ID outpatient    Discharge Diagnoses:  Principal Problem:   Shingles Active Problems:   Human immunodeficiency virus (HIV) disease (HCC)   Anemia   Essential hypertension   ARF (acute renal failure)   Unilateral primary osteoarthritis, left hip   Disseminated varicella   Discharge Condition: stable  Diet recommendation: heart healthy  Filed Weights   02/25/24 2255  Weight: 59 kg    History of present illness:   63 year old with history of HIV, substance abuse, HTN, OA, hepatitis C treated for left lower extremity pain about Belicia Difatta week ago comes to the ER with concerns of possible rash concerning for shingles. In the ER noted to have mild hyponatremia. She also reported of back pain. ID was called and patient was started on IV acyclovir due to concerns of disseminated varicella-zoster. She's improved on acyclovir.  Planning to discharge on valtrex.    She had abnormal MRI L spine with possible findings c/w early septic facet joint infection.  Low suspicion, planning to follow this outpatient with ID.   Hospital Course:  Assessment and Plan:  Varicella-zoster infection left lower extremity Back pain -Due to concerns of possible disseminated disease, on empiric IV acyclovir per ID recommendations especially in her immunocompromise state.  MRI of the cervical and thoracic spine shows chronic changes but in the lumbar spine there is concerns of possible early infection therefore will need repeat MRI 11/15 (ordered).  ID team is following.   -- rash improving, transitioned to oral valtrex today per discussion with ID - MRI brain negative - Mildly elevated CRP and ESR -Added  gabapentin for nerve pain - discussed with ID, planning to consider repeating MRI lumbar spine as outpatient   Acute kidney injury on CKD stage IIIa; Resolved. Hyponatremia, hypokalemia/Hypomag - improved, follow outpatient   HIV - On Biktarvy   Severe osteoarthritis of the left hip - Follows outpatient orthopedic, Dr. Vernetta   Essential hypertension - Norvasc    History of polysubstance abuse - On methadone     Procedures: none   Consultations: Infectious disasea  Discharge Exam: Vitals:   03/04/24 0636 03/04/24 0933  BP: (!) 156/94 (!) 143/80  Pulse: 72 84  Resp:  18  Temp: 98.1 F (36.7 C) 98.6 F (37 C)  SpO2: 99% 100%   Eager to discharge Says they told her she could discharge today  General: No acute distress. Cardiovascular: RRR Lungs: unlabored Neurological: Alert and oriented 3. Moves all extremities 4 with equal strength. Cranial nerves II through XII grossly intact. Skin: Warm and dry. No rashes or lesions. Extremities: extensive LLE rash with scale - scabbing over  Discharge Instructions   Discharge Instructions     Call MD for:  difficulty breathing, headache or visual disturbances   Complete by: As directed    Call MD for:  extreme fatigue   Complete by: As directed    Call MD for:  hives   Complete by: As directed    Call MD for:  persistant dizziness or light-headedness   Complete by: As directed    Call MD for:  persistant nausea and vomiting   Complete by: As directed    Call MD for:  redness, tenderness, or signs of infection (pain, swelling,  redness, odor or green/yellow discharge around incision site)   Complete by: As directed    Call MD for:  severe uncontrolled pain   Complete by: As directed    Call MD for:  temperature >100.4   Complete by: As directed    Diet - low sodium heart healthy   Complete by: As directed    Discharge instructions   Complete by: As directed    You were seen for disseminated zoster infection  (shingles).  You've improved with antivirals.  We'll send you home with oral antivirals to complete Chanelle Hodsdon course.    Follow up with the infectious disease clinic as scheduled.  They'll discuss possibly repeating an MRI of your lumbar spine.   Return for new, recurrent, or worsening symptoms.  Please ask your PCP to request records from this hospitalization so they know what was done and what the next steps will be.   Increase activity slowly   Complete by: As directed       Allergies as of 03/04/2024   No Known Allergies      Medication List     TAKE these medications    amLODipine  5 MG tablet Commonly known as: NORVASC  Take 1 tablet (5 mg total) by mouth daily. Follow with your outpatient provider for refills Start taking on: March 05, 2024   bictegravir-emtricitabine -tenofovir  AF 50-200-25 MG Tabs tablet Commonly known as: BIKTARVY Take 1 tablet by mouth daily. Stop taking Triumeq  and Prezcobix .   Ensure Take 237 mLs by mouth 2 (two) times daily between meals.   gabapentin 100 MG capsule Commonly known as: NEURONTIN Take 2 capsules (200 mg total) by mouth 3 (three) times daily. Follow with your outpatient provider for refills   methadone 10 MG/ML solution Commonly known as: DOLOPHINE Take 80 mg by mouth daily.   oxyCODONE -acetaminophen  5-325 MG tablet Commonly known as: PERCOCET/ROXICET Take 1 tablet by mouth every 6 (six) hours as needed for severe pain (pain score 7-10).   valACYclovir 1000 MG tablet Commonly known as: Valtrex Take 1 tablet (1,000 mg total) by mouth 3 (three) times daily for 10 days.       No Known Allergies    The results of significant diagnostics from this hospitalization (including imaging, microbiology, ancillary and laboratory) are listed below for reference.    Significant Diagnostic Studies: MR BRAIN WO CONTRAST Result Date: 02/25/2024 EXAM: MRI BRAIN WITHOUT CONTRAST 02/25/2024 03:41:46 PM TECHNIQUE: Multiplanar multisequence  MRI of the head/brain was performed without the administration of intravenous contrast. COMPARISON: CT head 08/27/2022. CLINICAL HISTORY: Neuro deficit, acute, stroke suspected. FINDINGS: BRAIN AND VENTRICLES: There is no evidence of an acute infarct, intracranial hemorrhage, mass, midline shift, hydrocephalus, or extra-axial fluid collection. Cerebral volume is normal. T2 hyperintensities in the cerebral white matter and pons are nonspecific but compatible with mild to moderate chronic small vessel ischemic disease. Henessy Rohrer chronic lacunar infarct is noted in the right basal ganglia. Major intracranial vascular flow voids are preserved. ORBITS: No acute abnormality. SINUSES AND MASTOIDS: No acute abnormality. BONES AND SOFT TISSUES: Normal marrow signal. No acute soft tissue abnormality. IMPRESSION: 1. No acute intracranial abnormality. 2. Mild to moderate chronic small vessel ischemic disease. Electronically signed by: Dasie Hamburg MD 02/25/2024 04:09 PM EST RP Workstation: [id number]   MR Lumbar Spine W Wo Contrast Result Date: 02/25/2024 EXAM: MRI LUMBAR SPINE 02/25/2024 03:57:00 AM TECHNIQUE: Multiplanar multisequence MRI of the lumbar spine was performed with and without the administration of intravenous contrast. COMPARISON: Cervical and thoracic MRI today  reported separately. CLINICAL HISTORY: 63 year old HIV positive female with cervical radiculopathy, infection suspected, and possible disseminated varicella infection. History of IV drug use. FINDINGS: BONES AND ALIGNMENT: Normal lumbar segmentation, concordant with the other spinal numbering today. Relatively maintained lumbar lordosis. Mild extraconvex lumbar scoliosis. Generalized decreased T1 marrow signal in the visible spine and pelvis, similar to the other spinal levels to date. Maintained lumbar vertebral height. No lumbar vertebral body marrow edema. Visible sacrum appears intact. Conspicuous fluid in degenerated bilateral facet joints at L4-L5, and  suggestion of mild/developing adjacent facet marrow edema and soft tissue inflammation with enhancement (series 6 and series 10 images 4 and 12). SPINAL CORD: Normal conus medullaris at L1. No signal abnormality in the visible lower thoracic spinal cord or conus. Normal cauda equina nerve roots. No abnormal intradural enhancement. No dural thickening identified. SOFT TISSUES: No paraspinal fluid collection. Small benign left posterior renal cyst (no follow up imaging recommended). Spleen is mostly not visible on these images. L1-L2: Negative. L2-L3: Negative. L3-L4: Mild disc desiccation, disc bulging, mild facet hypertrophy. No significant stenosis. L4-L5: Mild circumferential disc bulge. Capacious facet joints containing fluid (series 8 image 25). With mild to moderate facet and ligamentum flavum hypertrophy. No significant stenosis. L5-S1: Negative disc. Mild facet hypertrophy. No stenosis. IMPRESSION: 1. Abnormal bilateral facets at L4-L5, with joint fluid and mild/early adjacent inflammation. While active degenerative facet joint arthritis is possible, early septic facet joint infection is not excluded. Short interval follow-up MRI in 10 to 14 days might be most valuable. 2. No other evidence of lumbar spinal infection. And otherwise Mild for age lumbar spine degeneration without significant stenosis. Electronically signed by: Helayne Hurst MD 02/25/2024 04:51 AM EST RP Workstation: [id number]   MR THORACIC SPINE W WO CONTRAST Result Date: 02/25/2024 EXAM: MRI THORACIC SPINE WITH AND WITHOUT INTRAVENOUS CONTRAST 02/25/2024 03:56:00 AM TECHNIQUE: Multiplanar multisequence MRI of the thoracic spine was performed with and without the administration of intravenous contrast. COMPARISON: Cervical spine MRI 02/25/2024, reported separately. CLINICAL HISTORY: 63 year old HIV positive female with cervical radiculopathy, suspected infection, and possible disseminated varicella infection. FINDINGS: BONES AND ALIGNMENT:  Normal alignment. Mildly exaggerated thoracic kyphosis. Thoracic spine segmentation appears to be normal. Relatively maintained thoracic vertebral body heights. Multilevel patchy and heterogeneous abnormal marrow signal from the T5 through T7 vertebral bodies. Patchy and confluent marrow edema and enhancement at these levels, superimposed on severe disc space loss at both T5-T6 and T6-T7, and suggestion of vacuum disc at both levels. No obvious disc space enhancement. No paraspinal soft tissue inflammation is associated. SPINAL CORD: Normal spinal cord volume. No convincing abnormal thoracic spinal cord signal. The conus medullaris occurs below T12. No abnormal intradural enhancement or dural thickening is identified. SOFT TISSUES: Negative visible thoracic viscera. Splenomegaly partially visible in the upper abdomen. Thoracic paraspinal soft tissues are within normal limits. DEGENERATIVE CHANGES: Capacious thoracic spinal canal at most levels. T3-T4: Disc space loss with circumferential and/or lobulated bilateral posterior disc protrusion. Mild facet hypertrophy. Borderline to mild spinal stenosis (series 16 image 13). Mild left T3 neural foraminal stenosis. No spinal cord mass effect. T4-T5: Less lobulated circumferential disc bulge or protrusion. Mild facet hypertrophy. Effaced ventral cerebrospinal fluid (CSF) space without significant spinal stenosis. Mild left T4 neural foraminal stenosis. T5-T6: Moderate disc space loss with possible vacuum disc. Circumferential disc osteophyte complex. Mild facet hypertrophy. No spinal stenosis. Mild to moderate bilateral T5 neural foraminal stenosis. T6-T7: Advanced disc space loss with strong evidence of vacuum disc. Broad based posterior disc or disc  osteophyte complex (series 13 image 13). Effaced central cerebrospinal fluid (CSF) space. Dorsal cerebrospinal fluid (CSF) space remains patent, borderline spinal stenosis. No foraminal stenosis. T7-T8: Moderate disc space  loss. Circumferential disc osteophyte complex with Zeidy Tayag broad based posterior component. No stenosis. T8-T9: Better maintained disc height. Lobulated posterior disc bulge or protrusion asymmetric to the left (series 16 image 31). Mild facet hypertrophy. No spinal stenosis. Mild neural foraminal stenosis. T9-T10: Better maintained disc space but moderate sized central disc protrusion (series 13 image 12 and series 16 image 35). Effaced ventral spinal cord, dorsal cerebrospinal fluid (CSF) space remains patent. Mild spinal stenosis overall. Mild degenerative ventral cord mass effect is possible. Mild T9 neural foraminal stenosis. Mild for age degeneration at other thoracic levels. IMPRESSION: 1. Thoracic T5 through T7 abnormal marrow edema and enhancement, however, similar to the cervical exam there is severe intervening disc degeneration, and absent paraspinal soft tissue inflammation. Favor these changes are degenerative in nature. No strong evidence of thoracic spinal infection at this time. 2. Other multilevel thoracic Degeneration. Borderline to mild degenerative spinal stenosis results, with isolated mild spinal cord mass effect at at T9T10. No convincing abnormal spinal cord signal. Occasional degenerative neural foraminal stenosis. 3. Splenomegaly partially visible. Electronically signed by: Helayne Hurst MD 02/25/2024 04:39 AM EST RP Workstation: [id number]   MR Cervical Spine W and Wo Contrast Result Date: 02/25/2024 EXAM: MRI CERVICAL SPINE WITH AND WITHOUT CONTRAST 02/25/2024 03:55:00 AM TECHNIQUE: Multiplanar multisequence MRI of the cervical spine was performed without and with the administration of intravenous contrast. COMPARISON: Report of prior cervical spine CT 08/27/2022. Image is not currently available. CLINICAL HISTORY: 63 year old HIV positive female with cervical radiculopathy, infection suspected, and possible disseminated varicella infection. History of IV drug use. FINDINGS: BONES AND  ALIGNMENT: Straightening of cervical lordosis with chronic retrolisthesis of C5 on C6. Mild generalized decreased T1 background bone marrow signal. Heterogeneous marrow signal at C5-C6 with patchy STIR hyperintensity and heterogeneous enhancement. Severe disc space loss at C5-C6. Mild enhancement within the disc space at C5-C6. However, no paraspinal soft tissue inflammation. Maintained cervical vertebral height. No other marrow edema in the cervical spine. SPINAL CORD: Cervical spinal cord signal and morphology within normal limits. No abnormal intradural enhancement. No dural thickening. Negative visible cervicomedullary junction and posterior fossa. SOFT TISSUES: Preserved major vascular flow voids in the bilateral neck. Negative visible neck soft tissues. No paraspinal mass. DEGENERATIVE FINDINGS: Minimal for age cervical spine degeneration C2-C3 through C4-C5. No stenosis at those levels. C5-C6: Severe disc space loss. Asymmetric circumferential disc osteophyte complex to the right. Mild to moderate facet and ligamentum flavum hypertrophy. Mild spinal stenosis. No convincing spinal cord mass effect. Moderate to severe bilateral C6 neural foraminal stenosis. C6-C7: Disc space loss with circumferential disc osteophyte complex. Broad based left lateral recess component (series 12, image 30). Mild facet and ligamentum flavum hypertrophy. Borderline spinal stenosis. No cord mass effect. Moderate to severe bilateral C7 neural foraminal stenosis. C7-T1: Negative disc. Moderate facet hypertrophy. No spinal stenosis. Mild to moderate bilateral C8 neural foraminal stenosis. Thoracic spine is reported separately. IMPRESSION: 1. Abnormal C5-C6 cervical level with patchy marrow edema and enhancement. But absent paraspinal soft tissue inflammation suggests this is degenerative etiology. No strong evidence of cervical spinal infection at this time. 2. Advanced disc space loss at C5-C6 with chronic retrolisthesis. And less  pronounced degeneration at bothC6-C7 and C7-T1. Mild spinal stenosis, convincing spinal cord mass effect or signal abnormality. Moderate to severe degenerative bilateral C6 and C7 neural foraminal  stenosis Electronically signed by: Helayne Hurst MD 02/25/2024 04:24 AM EST RP Workstation: [id number]   DG Femur Min 2 Views Left Result Date: 02/19/2024 CLINICAL DATA:  left leg pain EXAM: LEFT FEMUR 2 VIEWS COMPARISON:  01/15/2024 FINDINGS: No acute fracture or dislocation. Severe joint space loss of the left hip with bone-on-bone articulation superiorly. Soft tissues are unremarkable. IMPRESSION: 1. No acute fracture or dislocation. 2. Severe osteoarthritis of the left hip with bone-on-bone articulation. Electronically Signed   By: Rogelia Myers M.D.   On: 02/19/2024 16:16   CT PELVIS WO CONTRAST Result Date: 02/19/2024 EXAM: CT PELVIS, WITHOUT IV CONTRAST 02/19/2024 01:26:41 PM TECHNIQUE: Axial images were acquired through the pelvis without IV contrast. Reformatted images were reviewed. Automated exposure control, iterative reconstruction, and/or weight based adjustment of the mA/kV was utilized to reduce the radiation dose to as low as reasonably achievable. COMPARISON: None available. CLINICAL HISTORY: Pelvic fracture. FINDINGS: BONES: No acute fracture or focal osseous lesion. JOINTS: Severe narrowing of the left hip joint is noted, consistent with osteoarthritis. Minimal left hip joint effusion is noted, potentially related to osteoarthritis. No dislocation. SOFT TISSUES: Cerclage device seen in the pelvis. INTRAPELVIC CONTENTS: Limited images of the intrapelvic contents demonstrate no acute abnormality. IMPRESSION: 1. No acute osseous abnormality. 2. Severe left hip osteoarthritis with minimal left hip joint effusion, likely degenerative. Electronically signed by: Lynwood Seip MD 02/19/2024 02:02 PM EDT RP Workstation: [id number]    Microbiology: No results found for this or any previous visit (from  the past 240 hours).   Labs: Basic Metabolic Panel: Recent Labs  Lab 02/28/24 0524 02/29/24 0007 02/29/24 0539 03/01/24 0438 03/02/24 0501 03/03/24 0032  NA 130*  --  132* 131* 133* 132*  K 4.3  --  4.7 4.7 4.6 4.0  CL 98  --  97* 99 99 99  CO2 21*  --  27 24 26 24   GLUCOSE 88  --  117* 91 133* 115*  BUN 20  --  14 15 18  27*  CREATININE 0.95  --  0.93 0.71 0.87 0.73  CALCIUM 8.7*  --  9.2 9.0 9.3 8.9  MG  --  1.6*  --   --   --  1.4*   Liver Function Tests: No results for input(s): AST, ALT, ALKPHOS, BILITOT, PROT, ALBUMIN in the last 168 hours. No results for input(s): LIPASE, AMYLASE in the last 168 hours. No results for input(s): AMMONIA in the last 168 hours. CBC: Recent Labs  Lab 02/29/24 0007 03/03/24 0032  WBC 6.8 5.9  HGB 10.8* 10.3*  HCT 32.5* 31.9*  MCV 83.5 85.8  PLT 248 292   Cardiac Enzymes: No results for input(s): CKTOTAL, CKMB, CKMBINDEX, TROPONINI in the last 168 hours. BNP: BNP (last 3 results) No results for input(s): BNP in the last 8760 hours.  ProBNP (last 3 results) No results for input(s): PROBNP in the last 8760 hours.  CBG: No results for input(s): GLUCAP in the last 168 hours.     Signed:  Meliton Monte MD.  [facility] 03/04/2024, 4:10 PM

## 2024-03-04 NOTE — Progress Notes (Signed)
 Bascom HERO Erhard to be D/C'd  per MD order.  Discussed with the patient and all questions fully answered.  VSS, Skin clean, dry and intact without evidence of skin break down, no evidence of skin tears noted.  IV catheter discontinued intact. Site without signs and symptoms of complications. Dressing and pressure applied.  An After Visit Summary was printed and given to the patient. Patient received prescription from [facility] pharmacy and waiting at valet for LYFT ride.  D/c education completed with patient/family including follow up instructions, medication list, d/c activities limitations if indicated, with other d/c instructions as indicated by MD - patient able to verbalize understanding, all questions fully answered.   Patient instructed to return to ED, call 911, or call MD for any changes in condition.   Patient to be escorted via WC, and D/C home via private auto.

## 2024-03-04 NOTE — Progress Notes (Signed)
 [facility]  Date of Admission:  02/24/2024     Reason for Follow Up: Shingles  Total days of antibiotics 10         ASSESSMENT:  Courtney Ellis is a 63 year old African-American female with HIV disease presenting with a rash and found to have acute zoster infection being treated with acyclovir. MRI with concern for septic facet versus arthritis.   Courtney Ellis lesion have improved to a point where we can change to oral valacyclovir 1 g three times daily for 10 more days. Discussed MRI findings and recommendations and following detailed discussion about risks and benefits she will pursue the MRI from outpatient standpoint. This appears reasonable as she does not have a clinical exam that would be consistent with infection. Discussed return precautions and she has a follow up with ID scheduled for next week. Ok for discharge from ID standpoint. Continue airborne/contact precautions while admitted. Remaining medical and supportive care per Internal Medicine.   PLAN:  Change IV acyclovir to valacyclovir 1 g PO TID for 10 days through 03/14/24. MRI outpatient to follow up on previous lumbar findings with clinical exam being inconsistent with infection.  Continue Biktarvy for ART Ok for discharge from ID standpoint.  Follow up ID clinic as planned. Continue Airborne/contact precautions while admitted. Remaining medical and supportive care per Internal Medicine.   Principal Problem:   Shingles Active Problems:   Human immunodeficiency virus (HIV) disease (HCC)   Anemia   Essential hypertension   ARF (acute renal failure)   Unilateral primary osteoarthritis, left hip   Disseminated varicella    amLODipine   5 mg Oral Daily   bictegravir-emtricitabine -tenofovir  AF  1 tablet Oral Daily   docusate sodium  100 mg Oral BID   feeding supplement  237 mL Oral BID BM   gabapentin  200 mg Oral TID   methadone  80 mg Oral Daily    SUBJECTIVE:  Afebrile overnight  with no acute events. Lesions continue to heal well. Tolerating antivirals with no adverse side effect.   No Known Allergies   Review of Systems: Review of Systems  Constitutional:  Negative for chills, fever and weight loss.  Respiratory:  Negative for cough, shortness of breath and wheezing.   Cardiovascular:  Negative for chest pain and leg swelling.  Gastrointestinal:  Negative for abdominal pain, constipation, diarrhea, nausea and vomiting.  Skin:  Positive for rash.      OBJECTIVE: Vitals:   03/03/24 1626 03/03/24 2322 03/04/24 0636 03/04/24 0933  BP: 130/77 (!) 155/78 (!) 156/94 (!) 143/80  Pulse: 76 78 72 84  Resp: 14 18  18   Temp: 97.7 F (36.5 C) 98.3 F (36.8 C) 98.1 F (36.7 C) 98.6 F (37 C)  TempSrc: Oral Oral Oral Oral  SpO2: 100% 99% 99% 100%  Weight:      Height:       Body mass index is 23.04 kg/m.  Physical Exam Constitutional:      General: She is not in acute distress.    Appearance: She is well-developed.  Cardiovascular:     Rate and Rhythm: Normal rate and regular rhythm.     Heart sounds: Normal heart sounds.  Pulmonary:     Effort: Pulmonary effort is normal.     Breath sounds: Normal breath sounds.  Skin:    General: Skin is warm and dry.     Comments: Lesions appear crusted over with no new lesions appreciated.   Neurological:  Mental Status: She is alert and oriented to person, place, and time.     Lab Results Lab Results  Component Value Date   WBC 5.9 03/03/2024   HGB 10.3 (L) 03/03/2024   HCT 31.9 (L) 03/03/2024   MCV 85.8 03/03/2024   PLT 292 03/03/2024    Lab Results  Component Value Date   CREATININE 0.73 03/03/2024   BUN 27 (H) 03/03/2024   NA 132 (L) 03/03/2024   K 4.0 03/03/2024   CL 99 03/03/2024   CO2 24 03/03/2024    Lab Results  Component Value Date   ALT 20 02/25/2024   AST 36 02/25/2024   GGT 98 (H) 09/10/2017   ALKPHOS 65 02/25/2024   BILITOT 0.9 02/25/2024     Microbiology: No results  found for this or any previous visit (from the past 240 hours).   Greg Avalie Oconnor, NP [facility]  03/04/2024  3:24 PM

## 2024-03-04 NOTE — Plan of Care (Signed)

## 2024-03-05 ENCOUNTER — Other Ambulatory Visit (HOSPITAL_COMMUNITY): Payer: Self-pay

## 2024-03-07 DIAGNOSIS — B20 Human immunodeficiency virus [HIV] disease: Secondary | ICD-10-CM

## 2024-03-07 MED ORDER — LIDOCAINE 5 % EX PTCH: 1.0000 | MEDICATED_PATCH | CUTANEOUS | 1 refills | Status: AC

## 2024-03-07 MED ORDER — ENSURE PO LIQD: 237.0000 mL | Freq: Two times a day (BID) | ORAL | 11 refills | Status: AC

## 2024-03-07 MED ORDER — PREGABALIN 75 MG PO CAPS
75.0000 mg | ORAL_CAPSULE | Freq: Two times a day (BID) | ORAL | 0 refills | Status: AC
Start: 2024-03-07 — End: ?

## 2024-03-08 ENCOUNTER — Telehealth: Payer: Self-pay

## 2024-03-08 ENCOUNTER — Other Ambulatory Visit (HOSPITAL_COMMUNITY): Payer: Self-pay

## 2024-03-08 NOTE — Telephone Encounter (Signed)
 Received notification from [facility] MEDICAID regarding a prior authorization for Lidocaine  Patch. Authorization has been APPROVED from 03/08/24 to 03/08/2025 .   Per test claim, copay for 30 days supply is $4.00   Authorization # [id number]

## 2024-03-08 NOTE — Telephone Encounter (Signed)
 Submitted a Prior Authorization request to [facility] MEDICAID for Lidocaine  via [employer]. Will update once we receive a response.    PA ID: [id number]

## 2024-03-10 ENCOUNTER — Other Ambulatory Visit: Payer: Self-pay

## 2024-03-10 ENCOUNTER — Encounter: Payer: Self-pay | Admitting: Infectious Diseases

## 2024-03-10 ENCOUNTER — Ambulatory Visit (INDEPENDENT_AMBULATORY_CARE_PROVIDER_SITE_OTHER): Admitting: Infectious Diseases

## 2024-03-10 VITALS — BP 154/82 | HR 90 | Temp 97.6°F | Ht 63.0 in | Wt 123.0 lb

## 2024-03-10 DIAGNOSIS — B0223 Postherpetic polyneuropathy: Secondary | ICD-10-CM | POA: Diagnosis not present

## 2024-03-10 DIAGNOSIS — B027 Disseminated zoster: Secondary | ICD-10-CM

## 2024-03-10 DIAGNOSIS — Z79899 Other long term (current) drug therapy: Secondary | ICD-10-CM

## 2024-03-10 DIAGNOSIS — F1120 Opioid dependence, uncomplicated: Secondary | ICD-10-CM

## 2024-03-10 DIAGNOSIS — B20 Human immunodeficiency virus [HIV] disease: Secondary | ICD-10-CM | POA: Diagnosis present

## 2024-03-10 DIAGNOSIS — R52 Pain, unspecified: Secondary | ICD-10-CM

## 2024-03-10 DIAGNOSIS — I10 Essential (primary) hypertension: Secondary | ICD-10-CM | POA: Diagnosis not present

## 2024-03-10 MED ORDER — KETOROLAC TROMETHAMINE 10 MG PO TABS: 10.0000 mg | ORAL_TABLET | Freq: Four times a day (QID) | ORAL | 0 refills | Status: DC | PRN

## 2024-03-10 MED ORDER — KETOROLAC TROMETHAMINE 30 MG/ML IJ SOLN
30.0000 mg | Freq: Once | INTRAMUSCULAR | Status: AC
  Administered 2024-03-10: 30 mg via INTRAMUSCULAR

## 2024-03-10 NOTE — Progress Notes (Signed)
 Name: Courtney Ellis  DOB: 01-22-1961 MRN: [id number] PCP: Pcp, No    Brief Narrative:  Courtney Ellis is a 63 y.o. female with HIV, Stage 3, diagnosed on < 2008. CD4 nadir 20   VL 511,000 Transmission Risk: PWID hx History of OIs: none History of STIs: Hep B sAg (- 2019), sAb (-), cAb (); Hep A (+), Hep C (+, treated, RNA -) Quantiferon (- 2025)   Previous Regimens: Complera - 2012 Raltegravir  BID + Truvada + Darunavir /r Dolutegravir  + Truvada + Darunavir /r  Symtuza Biktarvy  2025   Genotypes: 2025 - m184V Y181C, Y188L  Subjective  Chief Complaint  Patient presents with   Follow-up     Discussed the use of AI scribe software for clinical note transcription with the patient, who gave verbal consent to proceed.  History of Present Illness   Courtney Ellis is a 63 year old female with HIV who presents for hospital follow-up and reengagement with HIV care after a disseminated zoster infection.  She was admitted to the hospital for a disseminated zoster infection affecting a large portion of skin on her left thigh, buttock and hip. She experiences ongoing painful burning sensations in the affected area, describing it as 'so painful' and 'a big burn'. The pain has been persistent and significantly impacts her daily activities. Pain radiates from the back of her leg down to her ankle, and she has had some spots on her head which have resolved. She has not heard from the pharmacy yet regarding lyrica  (we switched after she informed me the double dose of gabapentin  was ineffective). She received her lidocaine  patches and brought them with her today so we can find a good spot for them.   She was previously on Symtuza for her HIV treatment but has been switched to Biktarvy , which she finds easier to use, including the ability to take it on an empty stomach. She has been on Biktarvy  for about a month.  For the zoster infection, she is finishing out a course of Valtrex . She has been  prescribed Lyrica , but has not yet started it due to a pharmacy issue. Lidocaine  patches have been approved and she has received them. She is tearful today due to the pain.   Her social history includes a past history of heroin use, and she is currently on methadone . She has been clean for over four weeks and agrees that avoiding percocet at this stage is best interest.          10/19/2023   11:36 AM  Depression screen PHQ 2/9  Decreased Interest 0  Down, Depressed, Hopeless 1  PHQ - 2 Score 1  Altered sleeping 1  Tired, decreased energy 0  Change in appetite 1  Feeling bad or failure about yourself  0  Trouble concentrating 0  Moving slowly or fidgety/restless 0  Suicidal thoughts 0  PHQ-9 Score 3   Difficult doing work/chores Not difficult at all     Data saved with a previous flowsheet row definition    Review of Systems  Constitutional:  Negative for chills and fever.  Skin:  Positive for rash.  Neurological:  Positive for tingling (burning pain).    Past Medical History:  Diagnosis Date   Anxiety    Depression    Hepatitis    C- treated   Heroin abuse (HCC)    HIV infection (HCC)    Hypertension     Outpatient Medications Prior to Visit  Medication Sig Dispense Refill  amLODipine  (NORVASC ) 5 MG tablet Take 1 tablet (5 mg total) by mouth daily. Follow with your outpatient provider for refills 30 tablet 0   bictegravir-emtricitabine -tenofovir  AF (BIKTARVY ) 50-200-25 MG TABS tablet Take 1 tablet by mouth daily. Stop taking Triumeq  and Prezcobix . 30 tablet 2   Ensure (ENSURE) Take 237 mLs by mouth 2 (two) times daily between meals. 237 mL 11   lidocaine  (LIDODERM ) 5 % Place 1 patch onto the skin daily. Remove & Discard patch within 12 hours or as directed by MD 30 patch 1   methadone  (DOLOPHINE ) 10 MG/ML solution Take 80 mg by mouth daily.     pregabalin  (LYRICA ) 75 MG capsule Take 1 capsule (75 mg total) by mouth 2 (two) times daily. 60 capsule 0   valACYclovir   (VALTREX ) 1000 MG tablet Take 1 tablet (1,000 mg total) by mouth 3 (three) times daily for 10 days. 30 tablet 0   oxyCODONE -acetaminophen  (PERCOCET/ROXICET) 5-325 MG tablet Take 1 tablet by mouth every 6 (six) hours as needed for severe pain (pain score 7-10). (Patient not taking: Reported on 03/10/2024) 3 tablet 0   No facility-administered medications prior to visit.     No Known Allergies  Social History   Tobacco Use   Smoking status: Some Days    Types: Cigarettes    Passive exposure: Past   Smokeless tobacco: Never   Tobacco comments:    3-4 CIG  DAILY   Substance Use Topics   Alcohol use: No    Alcohol/week: 0.0 standard drinks of alcohol   Drug use: Not Currently    Types: IV, Heroin    Comment: No I    Family History  Problem Relation Age of Onset   Hypertension Mother    Alcohol abuse Mother    Depression Mother    Diabetes Mother    Drug abuse Mother    Alcohol abuse Father    Cancer Father    Depression Father    Drug abuse Father    Depression Sister    Diabetes Sister    Drug abuse Sister    Mental illness Sister    Depression Brother    Drug abuse Brother    Mental illness Brother    Depression Maternal Aunt    Drug abuse Maternal Aunt    Depression Maternal Uncle    Drug abuse Maternal Uncle    Depression Paternal Aunt    Diabetes Paternal Aunt    Drug abuse Paternal Aunt    Stroke Paternal Aunt    Alcohol abuse Paternal Uncle    Depression Paternal Uncle    Diabetes Paternal Uncle    Drug abuse Paternal Uncle    Kidney disease Paternal Uncle     Social History   Substance and Sexual Activity  Sexual Activity Not Currently   Partners: Male   Comment: [employer] 05/27/22        Objective  Vitals:   03/10/24 0947  BP: (!) 154/82  Pulse: 90  Temp: 97.6 F (36.4 C)  TempSrc: Temporal  SpO2: 97%  Weight: 123 lb (55.8 kg)  Height: 5' 3 (1.6 m)   Body mass index is 21.79 kg/m.  Physical Exam Vitals reviewed.   Constitutional:      Appearance: Normal appearance. She is not ill-appearing.  HENT:     Mouth/Throat:     Mouth: Mucous membranes are moist.     Pharynx: Oropharynx is clear.  Eyes:     General: No scleral icterus. Cardiovascular:  Rate and Rhythm: Normal rate.  Pulmonary:     Effort: Pulmonary effort is normal.  Skin:    Findings: Lesion and rash present.     Comments: Full thickness wound on left buttock/low back. Very dry. No drainage  Healing and re-epithelializing skin on thighs and hip  Neurological:     Mental Status: She is oriented to person, place, and time.  Psychiatric:        Mood and Affect: Mood normal.        Thought Content: Thought content normal.        Assessment and Plan    Human immunodeficiency virus (HIV) disease - HIV disease managed with Biktarvy , which she reports as easier to take and more flexible. Viral load recheck today to assess for response to change in therapy. Last CD4 pre-shingles infection was ~450. Defer checking now as it would be influenced largely from disseminated zoster she is still battling.  - Continue Biktarvy  once daily. - Ordered viral load test today. - Return in 2 month to check in again.   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 Persistent burning nerve pain. Gabapentin  ineffective. Lyrica  prescribed but not yet received. Lidocaine  patches approved - we applied 2 patches to back of her thigh and calf today. Counseled to avoid over the areas of skin where it still has not grown back. Toradol  30 mg injection administered in the office with relief of pain. Tolerated well w/o concern for side effect. Aquaphor recommended for skin moisture to aid healing. - Administered Toradol  injection for immediate pain relief. - She spoke with her pharmacy today during our visit - Lyrica  to be shipped to  house - advised to start with one capsule twice a day, increase to two twice a day if needed (150 mg per dose - can increase to 300 mg per dose if needed). Ketorolac  10 mg PO tablets also presribed - can take 1st after 4 pm today. Short term use with biktarvy .  Avoid ibuprofen / aleve  - tylenol  OK  - Use lidocaine  patches for localized pain relief. - Apply Aquaphor to affected areas to maintain skin moisture and help patch on her back to heal.  - Silicone foam dressings applied to low back/buttock with extras provided.  - Prescribed ketorolac  for additional anti-inflammatory pain management, to be taken three times a day as needed. 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 Recent drug test showed fentanyl , but she reports no use in over four weeks. Discussion on the importance of managing pain without opioids due to her history. - Continue methadone  maintenance therapy. - Avoid opioid medications for pain management.  Hypertension -  Continue amlodipine  daily       Orders Placed This Encounter  Procedures   HIV 1 RNA quant-no reflex-bld    Meds ordered this encounter  Medications   ketorolac  (TORADOL ) 10 MG tablet    Sig: Take 1 tablet (10 mg total) by mouth every 6 (six) hours as needed.    Dispense:  20 tablet    Refill:  0   ketorolac  (TORADOL ) 30 MG/ML injection 30 mg    Return in about 2 months (around 05/10/2024).  I personally spent a total of 51 minutes in the care of the patient today including preparing to see the patient, performing a medically appropriate exam/evaluation,  counseling and educating, placing orders, referring and communicating with other health care professionals, documenting clinical information in the EHR, independently interpreting results, and wound care performed by myself.  This evaluation (history, exam, medical decision making) was significant and separately  identifiable from the injection administration in the office today.    Corean Fireman, MSN, NP-C [facility] for Infectious Disease [facility]  [email or web address] Pager: [phone] Office: [phone] [facility] Main Line: [phone] *Secure Chat Communication Welcome

## 2024-03-10 NOTE — Patient Instructions (Addendum)
  Aquaphor on the dry patch on the butt then cover with the foam dressing - can re-use   Silicone foam dressing with boarder - 4 x 4 inch   Increase the lysine to 2 capsules three times a day - take with the valtrex to help   Lyrica plan -  Start with 1 capsule twice a day then increase to 2 capsules twice a day if needed.  May make you sleepy   Ketorolac  (toradol ) -  Can take first dose of this today after 4 pm. Take three times a day for a while until pain calms down some.  Don't mix with aleve  or ibuprofen

## 2024-03-12 LAB — HIV-1 RNA QUANT-NO REFLEX-BLD
HIV 1 RNA Quant: 174 {copies}/mL — ABNORMAL HIGH
HIV-1 RNA Quant, Log: 2.24 {Log_copies}/mL — ABNORMAL HIGH

## 2024-03-14 ENCOUNTER — Ambulatory Visit: Payer: Self-pay | Admitting: Infectious Diseases

## 2024-03-14 ENCOUNTER — Other Ambulatory Visit: Payer: Self-pay

## 2024-03-14 MED ORDER — KETOROLAC TROMETHAMINE 10 MG PO TABS: 10.0000 mg | ORAL_TABLET | Freq: Four times a day (QID) | ORAL | 0 refills | Status: AC | PRN

## 2024-05-02 ENCOUNTER — Encounter: Payer: Self-pay | Admitting: *Deleted

## 2024-05-03 ENCOUNTER — Other Ambulatory Visit: Payer: Self-pay

## 2024-05-03 ENCOUNTER — Encounter: Payer: Self-pay | Admitting: Infectious Diseases

## 2024-05-03 ENCOUNTER — Ambulatory Visit (INDEPENDENT_AMBULATORY_CARE_PROVIDER_SITE_OTHER): Admitting: Infectious Diseases

## 2024-05-03 VITALS — BP 134/86 | HR 65 | Temp 97.6°F | Ht 63.0 in | Wt 128.0 lb

## 2024-05-03 DIAGNOSIS — B20 Human immunodeficiency virus [HIV] disease: Secondary | ICD-10-CM

## 2024-05-03 DIAGNOSIS — B0229 Other postherpetic nervous system involvement: Secondary | ICD-10-CM | POA: Diagnosis not present

## 2024-05-03 DIAGNOSIS — M25552 Pain in left hip: Secondary | ICD-10-CM

## 2024-05-03 DIAGNOSIS — M1612 Unilateral primary osteoarthritis, left hip: Secondary | ICD-10-CM | POA: Diagnosis not present

## 2024-05-03 DIAGNOSIS — Z8619 Personal history of other infectious and parasitic diseases: Secondary | ICD-10-CM

## 2024-05-03 MED ORDER — ZOSTER VAC RECOMB ADJUVANTED 50 MCG/0.5ML IM SUSR
0.5000 mL | Freq: Once | INTRAMUSCULAR | 0 refills | Status: AC
  Filled 2024-05-03: qty 0.5, 1d supply, fill #0

## 2024-05-03 NOTE — Patient Instructions (Addendum)
" °  Dr. Vernetta - surgeon for your hip  Address: [address] [place], [place] [address] Phone: [phone]  They are talking about doing a hip replacement for you because of the severe arthritis.  Will update your lab work today. Your immune system is strong and looks good - he just wants to see your viral load undetectable < 200   I will be glad to forward results to him once they are back.   Looks like they are planning a colposcopy for you in January with Dr. Cleatus - I attached more info for you to read about  "

## 2024-05-03 NOTE — Progress Notes (Signed)
 "  Name: Courtney Ellis  DOB: 11-22-1960 MRN: [id number] PCP: Pcp, No    Brief Narrative:  Courtney Ellis is a 64 y.o. female with HIV, Stage 3, diagnosed on < 2008. CD4 nadir 20   VL 511,000 Transmission Risk: PWID hx History of OIs: none History of STIs: Hep B sAg (- 2019), sAb (-), cAb (); Hep A (+), Hep C (+, treated, RNA -) Quantiferon (- 2025)   Previous Regimens: Complera - 2012 Raltegravir  BID + Truvada + Darunavir /r Dolutegravir  + Truvada + Darunavir /r  Symtuza Biktarvy  2025   Genotypes: 2025 - m184V Y181C, Y188L  Subjective  Chief Complaint  Patient presents with   Follow-up     Discussed the use of AI scribe software for clinical note transcription with the patient, who gave verbal consent to proceed.  History of Present Illness   Courtney Ellis is a 64 year old female with HIV who presents for follow-up care.  She is currently on Biktarvy , with her last blood work in November showing a viral load of 174 copies. Her CD4 count in June was 412. She has not been at significant risk for low CD4 counts for many years.  She experiences a persistent tingling sensation in the back, which is localized and not painful. She describes it as 'annoying' rather than painful. She is not taking Lyrica  or pregabalin  for this symptom as it is not bothersome enough to warrant medication. She has a history of shingles, from which she has recovered well. There is no visible rash, and the skin has healed nicely. She experiences some lingering tingling, which she attributes to nerve irritation from the previous shingles infection.  She is considering hip surgery and has been referred to an orthopedic surgeon for a total hip replacement.  She has a history of HPV with low-grade abnormalities and a high-risk HPV strain, warranting further testing. She is scheduled for a colposcopy to assess cervical health further. She has been referred to a gynecologist for this procedure.           10/19/2023   11:36 AM  Depression screen PHQ 2/9  Decreased Interest 0  Down, Depressed, Hopeless 1  PHQ - 2 Score 1  Altered sleeping 1  Tired, decreased energy 0  Change in appetite 1  Feeling bad or failure about yourself  0  Trouble concentrating 0  Moving slowly or fidgety/restless 0  Suicidal thoughts 0  PHQ-9 Score 3   Difficult doing work/chores Not difficult at all     Data saved with a previous flowsheet row definition    Review of Systems  Constitutional:  Negative for chills and fever.  Skin:  Negative for rash.  Neurological:  Positive for tingling (improved, localized to posterior left thigh).    Past Medical History:  Diagnosis Date   Anxiety    Depression    Hepatitis    C- treated   Heroin abuse (HCC)    HIV infection (HCC)    Hypertension     Outpatient Medications Prior to Visit  Medication Sig Dispense Refill   bictegravir-emtricitabine -tenofovir  AF (BIKTARVY ) 50-200-25 MG TABS tablet Take 1 tablet by mouth daily. Stop taking Triumeq  and Prezcobix . 30 tablet 2   Ensure (ENSURE) Take 237 mLs by mouth 2 (two) times daily between meals. 237 mL 11   ketorolac  (TORADOL ) 10 MG tablet Take 1 tablet (10 mg total) by mouth every 6 (six) hours as needed. 60 tablet 0   lidocaine  (LIDODERM ) 5 % Place  1 patch onto the skin daily. Remove & Discard patch within 12 hours or as directed by MD 30 patch 1   methadone  (DOLOPHINE ) 10 MG/ML solution Take 80 mg by mouth daily.     pregabalin  (LYRICA ) 75 MG capsule Take 1 capsule (75 mg total) by mouth 2 (two) times daily. 60 capsule 0   amLODipine  (NORVASC ) 5 MG tablet Take 1 tablet (5 mg total) by mouth daily. Follow with your outpatient provider for refills (Patient not taking: Reported on 05/03/2024) 30 tablet 0   oxyCODONE -acetaminophen  (PERCOCET/ROXICET) 5-325 MG tablet Take 1 tablet by mouth every 6 (six) hours as needed for severe pain (pain score 7-10). (Patient not taking: Reported on 05/03/2024) 3 tablet 0   No  facility-administered medications prior to visit.     No Known Allergies  Social History   Tobacco Use   Smoking status: Some Days    Types: Cigarettes    Passive exposure: Past   Smokeless tobacco: Never   Tobacco comments:    3-4 CIG  DAILY   Substance Use Topics   Alcohol use: No    Alcohol/week: 0.0 standard drinks of alcohol   Drug use: Not Currently    Types: IV, Heroin    Comment: No I     Social History   Substance and Sexual Activity  Sexual Activity Not Currently   Partners: Male   Comment: [employer] 05/27/22        Objective  Vitals:   05/03/24 1006  BP: 134/86  Pulse: 65  Temp: 97.6 F (36.4 C)  TempSrc: Temporal  SpO2: 97%  Weight: 128 lb (58.1 kg)  Height: 5' 3 (1.6 m)   Body mass index is 22.67 kg/m.  Physical Exam Constitutional:      Appearance: Normal appearance. She is not ill-appearing.  HENT:     Mouth/Throat:     Mouth: Mucous membranes are moist.     Pharynx: Oropharynx is clear.  Eyes:     General: No scleral icterus. Pulmonary:     Effort: Pulmonary effort is normal.  Skin:    General: Skin is warm and dry.     Comments: Examined rash that extended throughout thigh and upper buttock. All spots are well healed and epithelized. She has scars as expected given the extent.   Neurological:     Mental Status: She is oriented to person, place, and time.  Psychiatric:        Mood and Affect: Mood normal.        Thought Content: Thought content normal.        Assessment and Plan    Human immunodeficiency virus (HIV) disease - HIV is well-controlled on Biktarvy  with a viral load of 174 copies/mL as of November. CD4 count was 412 in June, indicating a strong immune system. No significant risk of opportunistic infections. - Ordered blood work to check viral load and CD4 count - Provided contact information for orthopedic surgeon Dr. Vernetta for hip surgery consultation - Ensure labs are up to date to decrease risk for  postoperative issues  Postherpetic neuralgia following shingles - Experiencing tingling sensation in the area previously affected by shingles, without pain. Skin is healed, and tingling is likely due to nerve damage from the infection. No visible rash or pain reported. Tingling is expected to improve over time as nerves heal. - Monitor tingling sensation; will consider pregabalin  if it becomes bothersome - Provided prescription for shingles vaccine (Shingrix ) to be administered at the pharmacy -  Discussed potential side effects of shingles vaccine, including soreness and aches, especially after the second dose   Left Hip Arthritis, Severe -  she is interested in resuming traction on hip replacement. I believe her skin has healed up enough to consider incision here. Will update VL and CD4 and coordinate with Dr. Vernetta.  I gave her his contact information    Coordination of Care -  Abnormal pap smear -  Reviewed plans for GYN appt coming up for colposcopy to investigate abnormal pap smear results. Counseled on lab findings and what is needed going forward.         Orders Placed This Encounter  Procedures   HIV 1 RNA quant-no reflex-bld   T-helper cells (CD4) count    Meds ordered this encounter  Medications   Zoster Vaccine Adjuvanted (SHINGRIX ) injection    Sig: Inject 0.5 mLs into the muscle once for 1 dose.    Dispense:  0.5 mL    Refill:  0    Return in about 3 months (around 08/01/2024).    Corean Fireman, MSN, NP-C [facility] for Infectious Disease [facility]  [email or web address] Pager: [phone] Office: [phone] [facility] Main Line: [phone] *Secure Chat Communication Welcome    "

## 2024-05-04 LAB — T-HELPER CELLS (CD4) COUNT (NOT AT ARMC)
CD4 % Helper T Cell: 18 % — ABNORMAL LOW (ref 33–65)
CD4 T Cell Abs: 262 /uL — ABNORMAL LOW (ref 400–1790)

## 2024-05-05 LAB — HIV-1 RNA QUANT-NO REFLEX-BLD
HIV 1 RNA Quant: 421 {copies}/mL — ABNORMAL HIGH
HIV-1 RNA Quant, Log: 2.62 {Log_copies}/mL — ABNORMAL HIGH

## 2024-05-12 NOTE — Progress Notes (Unsigned)
" ° ° °  GYNECOLOGY OFFICE COLPOSCOPY PROCEDURE NOTE  64 y.o. [id number] here for colposcopy for LSIL, HPV positive, non 16/18 pap smear on 08/18/23.   She is HIV positive on ART following with ID.   Pap History: 2009: normal pap 2012: cannot open file.  2018: ASCUS/HPV Neg 2020: LSIL/HPV neg 08/18/23: LSIL/HPV pos, non 16/18  Pregnancy test:  not indicated Gardasil:  {Blank single:19197::completed,not yet had, declines,not yet had, accepts}. Discussed role for HPV in cervical dysplasia, need for surveillance.  Informed consent and review of risks, benefit and alternatives performed. Written consent given.   Speculum inserted into patient's vagina assuring full view of cervix and vaginal walls. 3 swabs of vinegar solution applied to the cervix and vaginal walls and colposcope was used to observe both the cervix and vaginal walls.   Colposcopy adequate? {yes/no:20286} {pad colposcopy findings:34045}.  Corresponding biopsies obtained? {yes/no:20286} ECC collected? {yes/no:20286}  Specimen(s) labeled and sent to pathology.  {colpo hemostasis:34046} Speculum removed.  Pt tolerated well with minimal pain and bleeding.   Patient was given post procedure instructions.  Will follow up pathology and manage accordingly; patient will be contacted with results and recommendations.  Routine preventative health maintenance measures emphasized.    Vina Solian, MD, FACOG Obstetrician & Gynecologist, [facility] for [facility], [facility]       "

## 2024-05-16 ENCOUNTER — Ambulatory Visit: Payer: Self-pay | Admitting: Infectious Diseases

## 2024-05-16 ENCOUNTER — Ambulatory Visit: Admitting: Obstetrics and Gynecology

## 2024-06-16 ENCOUNTER — Ambulatory Visit: Payer: Self-pay | Admitting: Obstetrics & Gynecology

## 2024-07-29 ENCOUNTER — Ambulatory Visit: Payer: Self-pay | Admitting: Infectious Diseases
